# Patient Record
Sex: Female | Born: 1960 | Race: Black or African American | Hispanic: No | Marital: Single | Smoking: Never smoker
Health system: Southern US, Community
[De-identification: ages and names within clinical notes are randomized; demographics above are authoritative.]

## PROBLEM LIST (undated history)

## (undated) DIAGNOSIS — L309 Dermatitis, unspecified: Secondary | ICD-10-CM

## (undated) DIAGNOSIS — E079 Disorder of thyroid, unspecified: Secondary | ICD-10-CM

## (undated) DIAGNOSIS — N289 Disorder of kidney and ureter, unspecified: Secondary | ICD-10-CM

## (undated) DIAGNOSIS — J020 Streptococcal pharyngitis: Secondary | ICD-10-CM

## (undated) DIAGNOSIS — D649 Anemia, unspecified: Secondary | ICD-10-CM

## (undated) DIAGNOSIS — M1990 Unspecified osteoarthritis, unspecified site: Secondary | ICD-10-CM

## (undated) HISTORY — DX: Anemia, unspecified: D64.9

## (undated) HISTORY — DX: Unspecified osteoarthritis, unspecified site: M19.90

---

## 1998-03-05 HISTORY — PX: ABDOMINAL HYSTERECTOMY: SHX81

## 2000-06-02 ENCOUNTER — Emergency Department (HOSPITAL_COMMUNITY): Admission: EM | Admit: 2000-06-02 | Discharge: 2000-06-02 | Payer: Self-pay | Admitting: Emergency Medicine

## 2011-10-25 ENCOUNTER — Emergency Department (HOSPITAL_COMMUNITY): Payer: BC Managed Care – PPO

## 2011-10-25 ENCOUNTER — Encounter (HOSPITAL_COMMUNITY): Payer: Self-pay | Admitting: *Deleted

## 2011-10-25 ENCOUNTER — Emergency Department (HOSPITAL_COMMUNITY)
Admission: EM | Admit: 2011-10-25 | Discharge: 2011-10-25 | Disposition: A | Discharge status: Home Health | Payer: BC Managed Care – PPO | Attending: Emergency Medicine | Admitting: Emergency Medicine

## 2011-10-25 ENCOUNTER — Other Ambulatory Visit: Payer: Self-pay

## 2011-10-25 DIAGNOSIS — R0600 Dyspnea, unspecified: Secondary | ICD-10-CM

## 2011-10-25 DIAGNOSIS — R0609 Other forms of dyspnea: Secondary | ICD-10-CM | POA: Insufficient documentation

## 2011-10-25 DIAGNOSIS — R079 Chest pain, unspecified: Secondary | ICD-10-CM | POA: Insufficient documentation

## 2011-10-25 DIAGNOSIS — N289 Disorder of kidney and ureter, unspecified: Secondary | ICD-10-CM | POA: Insufficient documentation

## 2011-10-25 DIAGNOSIS — R0989 Other specified symptoms and signs involving the circulatory and respiratory systems: Secondary | ICD-10-CM | POA: Insufficient documentation

## 2011-10-25 DIAGNOSIS — R5381 Other malaise: Secondary | ICD-10-CM | POA: Insufficient documentation

## 2011-10-25 DIAGNOSIS — Z79899 Other long term (current) drug therapy: Secondary | ICD-10-CM | POA: Insufficient documentation

## 2011-10-25 DIAGNOSIS — L259 Unspecified contact dermatitis, unspecified cause: Secondary | ICD-10-CM | POA: Insufficient documentation

## 2011-10-25 DIAGNOSIS — J45909 Unspecified asthma, uncomplicated: Secondary | ICD-10-CM | POA: Insufficient documentation

## 2011-10-25 HISTORY — DX: Disorder of kidney and ureter, unspecified: N28.9

## 2011-10-25 HISTORY — DX: Dermatitis, unspecified: L30.9

## 2011-10-25 LAB — URINALYSIS, ROUTINE W REFLEX MICROSCOPIC
Glucose, UA: NEGATIVE mg/dL
Leukocytes, UA: NEGATIVE
Specific Gravity, Urine: 1.017 (ref 1.005–1.030)
pH: 5.5 (ref 5.0–8.0)

## 2011-10-25 LAB — CBC
HCT: 35.3 % — ABNORMAL LOW (ref 36.0–46.0)
Hemoglobin: 12 g/dL (ref 12.0–15.0)
MCHC: 34 g/dL (ref 30.0–36.0)
RDW: 13.7 % (ref 11.5–15.5)
WBC: 6.3 10*3/uL (ref 4.0–10.5)

## 2011-10-25 LAB — HEPATIC FUNCTION PANEL
AST: 18 U/L (ref 0–37)
Albumin: 4.3 g/dL (ref 3.5–5.2)
Total Bilirubin: 0.3 mg/dL (ref 0.3–1.2)
Total Protein: 8.2 g/dL (ref 6.0–8.3)

## 2011-10-25 LAB — BASIC METABOLIC PANEL
BUN: 17 mg/dL (ref 6–23)
Chloride: 102 mEq/L (ref 96–112)
GFR calc Af Amer: 55 mL/min — ABNORMAL LOW (ref >90)
GFR calc non Af Amer: 48 mL/min — ABNORMAL LOW (ref >90)
Potassium: 3.7 mEq/L (ref 3.5–5.1)
Sodium: 138 mEq/L (ref 135–145)

## 2011-10-25 LAB — URINE MICROSCOPIC-ADD ON

## 2011-10-25 LAB — LIPASE, BLOOD: Lipase: 54 U/L (ref 11–59)

## 2011-10-25 MED ORDER — ALBUTEROL SULFATE HFA 108 (90 BASE) MCG/ACT IN AERS: 1.0000 | INHALATION_SPRAY | Freq: Four times a day (QID) | RESPIRATORY_TRACT | Status: DC | PRN

## 2011-10-25 MED ORDER — SODIUM CHLORIDE 0.9 % IV BOLUS (SEPSIS)
500.0000 mL | Freq: Once | INTRAVENOUS | Status: AC
  Administered 2011-10-25: 1000 mL via INTRAVENOUS

## 2011-10-25 MED ORDER — SODIUM CHLORIDE 0.9 % IV SOLN: INTRAVENOUS | Status: DC

## 2011-10-25 MED ORDER — ALBUTEROL SULFATE (5 MG/ML) 0.5% IN NEBU
5.0000 mg | INHALATION_SOLUTION | Freq: Once | RESPIRATORY_TRACT | Status: AC
  Administered 2011-10-25: 5 mg via RESPIRATORY_TRACT
  Filled 2011-10-25: qty 1

## 2011-10-25 MED ORDER — IPRATROPIUM BROMIDE 0.02 % IN SOLN
0.5000 mg | Freq: Once | RESPIRATORY_TRACT | Status: AC
  Administered 2011-10-25: 0.5 mg via RESPIRATORY_TRACT
  Filled 2011-10-25: qty 2.5

## 2011-10-25 NOTE — ED Provider Notes (Signed)
History     CSN: [id number]  Arrival date & time 10/25/11  0944   First MD Initiated Contact with Patient 10/25/11 [phone]      Chief Complaint  Patient presents with  . Chest Pain  . Shortness of Breath    (Consider location/radiation/quality/duration/timing/severity/associated sxs/prior treatment) Patient is a 51 y.o. female presenting with chest pain and shortness of breath. The history is provided by the patient.  Chest Pain Primary symptoms include fatigue and shortness of breath. Pertinent negatives for primary symptoms include no abdominal pain, no nausea and no vomiting.  Pertinent negatives for associated symptoms include no numbness and no weakness.    Shortness of Breath  Associated symptoms include chest pain and shortness of breath.   patient's had shortness of breath cough lightheadedness nausea and vomiting since Saturday. Dull chest pain. She has a history of asthma. She states she's had some chills. She did not take her temperature. No diarrhea. She states she's been urinating more frequently. She states she's heard all over.  Past Medical History  Diagnosis Date  . Renal disorder   . Asthma   . Eczema     Past Surgical History  Procedure Date  . Abdominal hysterectomy     No family history on file.  History  Substance Use Topics  . Smoking status: Never Smoker   . Smokeless tobacco: Not on file  . Alcohol Use: No    OB History    Grav Para Term Preterm Abortions TAB SAB Ect Mult Living                  Review of Systems  Constitutional: Positive for chills and fatigue. Negative for activity change and appetite change.  HENT: Negative for neck stiffness.   Eyes: Negative for pain.  Respiratory: Positive for shortness of breath. Negative for chest tightness.   Cardiovascular: Positive for chest pain. Negative for leg swelling.  Gastrointestinal: Negative for nausea, vomiting, abdominal pain and diarrhea.  Genitourinary: Positive for frequency.  Negative for flank pain.  Musculoskeletal: Negative for back pain.  Skin: Negative for rash.  Neurological: Negative for weakness, numbness and headaches.  Psychiatric/Behavioral: Negative for behavioral problems.    Allergies  Review of patient's allergies indicates no known allergies.  Home Medications   Current Outpatient Rx  Name Route Sig Dispense Refill  . ALBUTEROL SULFATE HFA 108 (90 BASE) MCG/ACT IN AERS Inhalation Inhale 2 puffs into the lungs every 6 (six) hours as needed. For shortness of breath    . FERROUS SULFATE 325 (65 FE) MG PO TABS Oral Take 325 mg by mouth 3 (three) times daily with meals.    Marland Kitchen LISINOPRIL 10 MG PO TABS Oral Take 10 mg by mouth daily.    . ADULT MULTIVITAMIN W/MINERALS CH Oral Take 1 tablet by mouth daily.    . ALBUTEROL SULFATE HFA 108 (90 BASE) MCG/ACT IN AERS Inhalation Inhale 1-2 puffs into the lungs every 6 (six) hours as needed for wheezing. 1 Inhaler 0    BP 138/89  Pulse 84  Temp(Src) 98.1 F (36.7 C) (Oral)  Resp 16  SpO2 100%  Physical Exam  Nursing note and vitals reviewed. Constitutional: She is oriented to person, place, and time. She appears well-developed and well-nourished.  HENT:  Head: Normocephalic and atraumatic.  Eyes: EOM are normal. Pupils are equal, round, and reactive to light.  Neck: Normal range of motion. Neck supple.  Cardiovascular: Normal rate, regular rhythm and normal heart sounds.   No  murmur heard. Pulmonary/Chest: Effort normal and breath sounds normal. No respiratory distress. She has no wheezes. She has no rales.  Abdominal: Soft. Bowel sounds are normal. She exhibits no distension. There is no tenderness. There is no rebound and no guarding.  Musculoskeletal: Normal range of motion.  Neurological: She is alert and oriented to person, place, and time. No cranial nerve deficit.  Skin: Skin is warm and dry.  Psychiatric: She has a normal mood and affect. Her speech is normal.    ED Course  Procedures  (including critical care time)  Labs Reviewed  CBC - Abnormal; Notable for the following:    HCT 35.3 (*)    MCV 73.2 (*)    MCH 24.9 (*)    All other components within normal limits  BASIC METABOLIC PANEL - Abnormal; Notable for the following:    Creatinine, Ser 1.29 (*)    GFR calc non Af Amer 48 (*)    GFR calc Af Amer 55 (*)    All other components within normal limits  URINALYSIS, ROUTINE W REFLEX MICROSCOPIC - Abnormal; Notable for the following:    Hgb urine dipstick SMALL (*)    Protein, ur 100 (*)    All other components within normal limits  URINE MICROSCOPIC-ADD ON - Abnormal; Notable for the following:    Squamous Epithelial / LPF FEW (*)    Bacteria, UA FEW (*)    All other components within normal limits  TROPONIN I  HEPATIC FUNCTION PANEL  LIPASE, BLOOD  LAB REPORT - SCANNED   Dg Chest 2 View  10/25/2011  *RADIOLOGY REPORT*  Clinical Data: Shortness of breath, cough, left mid chest pain  CHEST - 2 VIEW  Comparison: None.  Findings: The lungs are hyperaerated consistent with COPD.  Vague nodular opacities in the left lung apex may be due to pleuroparenchymal scarring but comparison with prior or follow-up chest x-ray versus CT of the chest would be recommended. Mediastinal contours are within normal limits.  The heart is normal in size.  There is a mild thoracic scoliosis present.  IMPRESSION: Hyperaeration consistent with COPD.  Opacity in the left lung apex probably represents pleuroparenchymal scarring but compare with prior or follow-up chest x-ray to assess stability.  Original Report Authenticated By: Juline Patch, M.D.     1. Dyspnea   2. Chest pain      Date: 10/25/2011  Rate: 101  Rhythm: normal sinus rhythm  QRS Axis: normal  Intervals: normal  ST/T Wave abnormalities: normal  Conduction Disutrbances:none  Narrative Interpretation:   Old EKG Reviewed: none available and unchanged    MDM  Shortness of breath and chest pain. Some chills. Sounds  more viral. Lab work next is reassuring. She'll be discharged home with followup as needed        Juliet Rude. Rubin Payor, MD 10/26/11 [phone]

## 2011-10-25 NOTE — ED Notes (Signed)
Pt reports increased shortness of breath since Saturday. Chest pain is in central chest began yesterday. Reports dizziness, lightheadedness, diaphoresis, nausea/vomiting. Chest pain non-radiating. Hx of asthma, using inhaler without relief of shortness of breath.

## 2011-10-25 NOTE — ED Notes (Signed)
Pt states she is feeling much better after breathing tx.

## 2011-10-25 NOTE — ED Notes (Signed)
RT contacted to give breathing treatment. Kenan, RT will administer treatment.

## 2011-10-25 NOTE — Discharge Instructions (Signed)
Chest Pain (Nonspecific) It is often hard to give a specific diagnosis for the cause of chest pain. There is always a chance that your pain could be related to something serious, such as a heart attack or a blood clot in the lungs. You need to follow up with your caregiver for further evaluation. CAUSES   Heartburn.   Pneumonia or bronchitis.   Anxiety and stress.   Inflammation around your heart (pericarditis) or lung (pleuritis or pleurisy).   A blood clot in the lung.   A collapsed lung (pneumothorax). It can develop suddenly on its own (spontaneous pneumothorax) or from injury (trauma) to the chest.  The chest wall is composed of bones, muscles, and cartilage. Any of these can be the source of the pain.  The bones can be bruised by injury.   The muscles or cartilage can be strained by coughing or overwork.   The cartilage can be affected by inflammation and become sore (costochondritis).  DIAGNOSIS  Lab tests or other studies, such as X-rays, an EKG, stress testing, or cardiac imaging, may be needed to find the cause of your pain.  TREATMENT   Treatment depends on what may be causing your chest pain. Treatment may include:   Acid blockers for heartburn.   Anti-inflammatory medicine.   Pain medicine for inflammatory conditions.   Antibiotics if an infection is present.   You may be advised to change lifestyle habits. This includes stopping smoking and avoiding caffeine and chocolate.   You may be advised to keep your head raised (elevated) when sleeping. This reduces the chance of acid going backward from your stomach into your esophagus.   Most of the time, nonspecific chest pain will improve within 2 to 3 days with rest and mild pain medicine.  HOME CARE INSTRUCTIONS   If antibiotics were prescribed, take the full amount even if you start to feel better.   For the next few days, avoid physical activities that bring on chest pain. Continue physical activities as  directed.   Do not smoke cigarettes or drink alcohol until your symptoms are gone.   Only take over-the-counter or prescription medicine for pain, discomfort, or fever as directed by your caregiver.   Follow your caregiver's suggestions for further testing if your chest pain does not go away.   Keep any follow-up appointments you made. If you do not go to an appointment, you could develop lasting (chronic) problems with pain. If there is any problem keeping an appointment, you must call to reschedule.  SEEK MEDICAL CARE IF:   You think you are having problems from the medicine you are taking. Read your medicine instructions carefully.   Your chest pain does not go away, even after treatment.   You develop a rash with blisters on your chest.  SEEK IMMEDIATE MEDICAL CARE IF:   You have increased chest pain or pain that spreads to your arm, neck, jaw, back, or belly (abdomen).   You develop shortness of breath, an increasing cough, or you are coughing up blood.   You have severe back or abdominal pain, feel sick to your stomach (nauseous) or throw up (vomit).   You develop severe weakness, fainting, or chills.   You have an oral temperature above 102 F (38.9 C), not controlled by medicine.  THIS IS AN EMERGENCY. Do not wait to see if the pain will go away. Get medical help at once. Call your local emergency services (911 in [place] Do not drive yourself to   the hospital. MAKE SURE YOU:   Understand these instructions.   Will watch your condition.   Will get help right away if you are not doing well or get worse.  Document Released: 06/01/2005 Document Revised: 05/04/2011 Document Reviewed: 03/27/2008 [facility] Patient Information 2012 [place], [place].Dyspnea Shortness of breath (dyspnea) is the feeling of uneasy breathing. Dyspnea should be evaluated promptly. DIAGNOSIS  Many tests may be done to find why you are having shortness of breath. Tests may include:  A chest X-ray.    A lung function test.   Blood tests.   Recordings of the electrical activity of the heart (electrocardiogram).   Exercise testing.   Sound wave images of the heart (a cardiac echocardiogram).   A scan.  A cause for your shortness of breath may not be identified initially. In this case, it is important to have a follow-up exam with your caregiver. HOME CARE INSTRUCTIONS   Do not smoke. Smoking is a common cause of shortness of breath. Ask for help to stop smoking.   Avoid being around chemicals that may bother your breathing, such as paint fumes or dust.   Rest as needed. Slowly begin your usual activities.   If medications were prescribed, take them as directed for the full length of time directed. This includes oxygen and any inhaled medications, if prescribed.   It is very important that you follow up with your caregiver or other physician as directed. Waiting to do so or failure to follow up could result in worsening of your condition, possible disability, or death.   Be sure you understand what to do or who to call if your shortness of breath worsens.  SEEK MEDICAL CARE IF:   Your condition does not improve in the time expected.   You have a hard time doing your normal activities even with rest.   You have any side effects from or problems with medications prescribed.  SEEK IMMEDIATE MEDICAL CARE IF:   You feel your shortness of breath is getting worse.   You feel lightheaded, faint or develop a cough not controlled with medications.   You start coughing up blood.   You get pain with breathing.   You get chest pain or pain in your arms, shoulders or belly (abdomen).   You have a fever.   You are unable to walk up stairs or exercise the way you normally can.  MAKE SURE YOU:   Understand these instructions.   Will watch your condition.   Will get help right away if you are not doing well or get worse.  Document Released: 09/29/2004 Document Revised:  05/04/2011 Document Reviewed: 01/07/2010 [facility] Patient Information 2012 [place], [place].

## 2011-10-25 NOTE — ED Notes (Signed)
Pt unable to give urine sample at this time, will check back with pt once fluids start

## 2012-08-01 ENCOUNTER — Emergency Department (HOSPITAL_COMMUNITY)
Admission: EM | Admit: 2012-08-01 | Discharge: 2012-08-01 | Disposition: A | Discharge status: Home / Self Care | Payer: Self-pay | Attending: Emergency Medicine | Admitting: Emergency Medicine

## 2012-08-01 ENCOUNTER — Encounter (HOSPITAL_COMMUNITY): Payer: Self-pay | Admitting: Emergency Medicine

## 2012-08-01 DIAGNOSIS — Z87448 Personal history of other diseases of urinary system: Secondary | ICD-10-CM | POA: Insufficient documentation

## 2012-08-01 DIAGNOSIS — J45909 Unspecified asthma, uncomplicated: Secondary | ICD-10-CM | POA: Insufficient documentation

## 2012-08-01 DIAGNOSIS — J029 Acute pharyngitis, unspecified: Secondary | ICD-10-CM | POA: Insufficient documentation

## 2012-08-01 DIAGNOSIS — Z79899 Other long term (current) drug therapy: Secondary | ICD-10-CM | POA: Insufficient documentation

## 2012-08-01 DIAGNOSIS — Z872 Personal history of diseases of the skin and subcutaneous tissue: Secondary | ICD-10-CM | POA: Insufficient documentation

## 2012-08-01 DIAGNOSIS — J020 Streptococcal pharyngitis: Secondary | ICD-10-CM | POA: Insufficient documentation

## 2012-08-01 DIAGNOSIS — R1310 Dysphagia, unspecified: Secondary | ICD-10-CM | POA: Insufficient documentation

## 2012-08-01 HISTORY — DX: Streptococcal pharyngitis: J02.0

## 2012-08-01 NOTE — ED Notes (Signed)
Patient reports taking past prescription of Amoxicillin to see if it would help.

## 2012-08-01 NOTE — ED Provider Notes (Signed)
Medical screening examination/treatment/procedure(s) were performed by non-physician practitioner and as supervising physician I was immediately available for consultation/collaboration.   Alphonse Asbridge, MD 08/01/12 2305 

## 2012-08-01 NOTE — ED Provider Notes (Signed)
History     CSN: [id number]  Arrival date & time 08/01/12  2034   First MD Initiated Contact with Patient 08/01/12 2056      Chief Complaint  Patient presents with  . Sore Throat    (Consider location/radiation/quality/duration/timing/severity/associated sxs/prior treatment) HPI Comments: Patient states she has a history of frequent sore throats, having strep throat 4-5 times a year.  She has opted not to have a tonsillectomy.  She states she had one amoxicillin left from the last time.  She had strep throat.  She took that yesterday.  She has not contacted her primary care physician.  Patient is a 51 y.o. female presenting with pharyngitis. The history is provided by the patient.  Sore Throat This is a new problem. The current episode started more than 1 month ago. The problem has been unchanged. Associated symptoms include a sore throat. Pertinent negatives include no fever, headaches, nausea, rash or weakness.    Past Medical History  Diagnosis Date  . Renal disorder   . Asthma   . Eczema   . Strep throat     Past Surgical History  Procedure Date  . Abdominal hysterectomy     History reviewed. No pertinent family history.  History  Substance Use Topics  . Smoking status: Never Smoker   . Smokeless tobacco: Not on file  . Alcohol Use: No    OB History    Grav Para Term Preterm Abortions TAB SAB Ect Mult Living                  Review of Systems  Constitutional: Negative for fever.  HENT: Positive for sore throat and trouble swallowing.   Gastrointestinal: Negative for nausea.  Skin: Negative for rash.  Neurological: Negative for weakness and headaches.    Allergies  Review of patient's allergies indicates no known allergies.  Home Medications   Current Outpatient Rx  Name  Route  Sig  Dispense  Refill  . ALBUTEROL SULFATE HFA 108 (90 BASE) MCG/ACT IN AERS   Inhalation   Inhale 2 puffs into the lungs every 6 (six) hours as needed. For shortness of  breath         . FLUTICASONE PROPIONATE  HFA 110 MCG/ACT IN AERO   Inhalation   Inhale 1 puff into the lungs 2 (two) times daily.         Marland Kitchen LISINOPRIL 10 MG PO TABS   Oral   Take 10 mg by mouth daily.           BP 105/66  Pulse 83  Temp 99.6 F (37.6 C) (Oral)  Resp 16  SpO2 99%  Physical Exam  HENT:  Mouth/Throat: Uvula is midline. No uvula swelling. Posterior oropharyngeal erythema present. No oropharyngeal exudate or tonsillar abscesses.  Eyes: Pupils are equal, round, and reactive to light.  Neck: Normal range of motion.  Cardiovascular: Normal rate.   Pulmonary/Chest: Effort normal.  Abdominal: Soft.  Musculoskeletal: Normal range of motion.  Lymphadenopathy:    She has no cervical adenopathy.  Neurological: She is alert.  Skin: Skin is warm. No rash noted.    ED Course  Procedures (including critical care time)   Labs Reviewed  RAPID STREP SCREEN   No results found.   1. Pharyngitis       MDM   Strep test is negative.  Referred the patient.  Back to her primary care physician at this time.  I do not, think that antibiotics are indicated  Arman Filter, NP 08/01/12 2146

## 2012-08-01 NOTE — ED Notes (Signed)
Patient complaining of sore throat since yesterday morning; patient reports that she has a history of frequent strep throat.  No patches noted on tonsils; tonsils swollen and red.

## 2012-12-22 ENCOUNTER — Emergency Department (HOSPITAL_COMMUNITY)
Admission: EM | Admit: 2012-12-22 | Discharge: 2012-12-23 | Disposition: A | Discharge status: Home / Self Care | Payer: Self-pay | Attending: Emergency Medicine | Admitting: Emergency Medicine

## 2012-12-22 DIAGNOSIS — Z8619 Personal history of other infectious and parasitic diseases: Secondary | ICD-10-CM | POA: Insufficient documentation

## 2012-12-22 DIAGNOSIS — J45909 Unspecified asthma, uncomplicated: Secondary | ICD-10-CM | POA: Insufficient documentation

## 2012-12-22 DIAGNOSIS — Z87448 Personal history of other diseases of urinary system: Secondary | ICD-10-CM | POA: Insufficient documentation

## 2012-12-22 DIAGNOSIS — Z872 Personal history of diseases of the skin and subcutaneous tissue: Secondary | ICD-10-CM | POA: Insufficient documentation

## 2012-12-22 DIAGNOSIS — D649 Anemia, unspecified: Secondary | ICD-10-CM | POA: Insufficient documentation

## 2012-12-22 DIAGNOSIS — R1031 Right lower quadrant pain: Secondary | ICD-10-CM | POA: Insufficient documentation

## 2012-12-22 DIAGNOSIS — Z79899 Other long term (current) drug therapy: Secondary | ICD-10-CM | POA: Insufficient documentation

## 2012-12-22 DIAGNOSIS — Z3202 Encounter for pregnancy test, result negative: Secondary | ICD-10-CM | POA: Insufficient documentation

## 2012-12-22 DIAGNOSIS — K5900 Constipation, unspecified: Secondary | ICD-10-CM | POA: Insufficient documentation

## 2012-12-23 ENCOUNTER — Encounter (HOSPITAL_COMMUNITY): Payer: Self-pay | Admitting: *Deleted

## 2012-12-23 ENCOUNTER — Emergency Department (HOSPITAL_COMMUNITY): Payer: Self-pay

## 2012-12-23 LAB — URINALYSIS, ROUTINE W REFLEX MICROSCOPIC
Bilirubin Urine: NEGATIVE
Glucose, UA: NEGATIVE mg/dL
Ketones, ur: NEGATIVE mg/dL
Leukocytes, UA: NEGATIVE
Nitrite: NEGATIVE
Protein, ur: NEGATIVE mg/dL
Specific Gravity, Urine: 1.027 (ref 1.005–1.030)
Urobilinogen, UA: 0.2 mg/dL (ref 0.0–1.0)
pH: 5.5 (ref 5.0–8.0)

## 2012-12-23 LAB — CBC
HCT: 30.5 % — ABNORMAL LOW (ref 36.0–46.0)
Hemoglobin: 10.6 g/dL — ABNORMAL LOW (ref 12.0–15.0)
MCH: 25.1 pg — ABNORMAL LOW (ref 26.0–34.0)
MCHC: 34.8 g/dL (ref 30.0–36.0)
MCV: 72.1 fL — ABNORMAL LOW (ref 78.0–100.0)
Platelets: 186 10*3/uL (ref 150–400)
RBC: 4.23 MIL/uL (ref 3.87–5.11)
RDW: 13.9 % (ref 11.5–15.5)
WBC: 7.9 10*3/uL (ref 4.0–10.5)

## 2012-12-23 LAB — URINE MICROSCOPIC-ADD ON

## 2012-12-23 LAB — POCT I-STAT, CHEM 8
Chloride: 106 mEq/L (ref 96–112)
Creatinine, Ser: 1.3 mg/dL — ABNORMAL HIGH (ref 0.50–1.10)
HCT: 34 % — ABNORMAL LOW (ref 36.0–46.0)
Hemoglobin: 11.6 g/dL — ABNORMAL LOW (ref 12.0–15.0)
Potassium: 4.2 mEq/L (ref 3.5–5.1)
Sodium: 141 mEq/L (ref 135–145)

## 2012-12-23 LAB — PREGNANCY, URINE: Preg Test, Ur: NEGATIVE

## 2012-12-23 MED ORDER — IOHEXOL 300 MG/ML  SOLN
100.0000 mL | Freq: Once | INTRAMUSCULAR | Status: AC | PRN
  Administered 2012-12-23: 100 mL via INTRAVENOUS

## 2012-12-23 MED ORDER — POLYETHYLENE GLYCOL 3350 17 G PO PACK: 17.0000 g | PACK | Freq: Every day | ORAL | Status: DC

## 2012-12-23 MED ORDER — IOHEXOL 300 MG/ML  SOLN
50.0000 mL | Freq: Once | INTRAMUSCULAR | Status: AC | PRN
  Administered 2012-12-23: 50 mL via ORAL

## 2012-12-23 MED ORDER — OXYCODONE-ACETAMINOPHEN 5-325 MG PO TABS: 2.0000 | ORAL_TABLET | ORAL | Status: DC | PRN

## 2012-12-23 MED ORDER — DOCUSATE SODIUM 100 MG PO CAPS: 100.0000 mg | ORAL_CAPSULE | Freq: Two times a day (BID) | ORAL | Status: DC

## 2012-12-23 MED ORDER — MORPHINE SULFATE 4 MG/ML IJ SOLN: 4.0000 mg | Freq: Once | INTRAMUSCULAR | Status: DC

## 2012-12-23 MED ORDER — DICYCLOMINE HCL 20 MG PO TABS
20.0000 mg | ORAL_TABLET | Freq: Once | ORAL | Status: AC
  Administered 2012-12-23: 20 mg via ORAL
  Filled 2012-12-23: qty 1

## 2012-12-23 MED ORDER — DICYCLOMINE HCL 20 MG PO TABS: 20.0000 mg | ORAL_TABLET | Freq: Four times a day (QID) | ORAL | Status: DC | PRN

## 2012-12-23 NOTE — ED Notes (Signed)
Patient is alert and oriented x3.  She was given DC instructions and follow up visit instructions.  Patient gave verbal understanding. She was DC ambulatory under her own power to home.  V/S stable.  He was not showing any signs of distress on DC 

## 2012-12-23 NOTE — ED Notes (Signed)
Patient is alert and oriented x3.  She is complaining of right groin pain that started on Wednesday night after she had a pelvic exam earlier that day.  She denies any nausea but does have constipation.  She currently rates her pain 10 of 10 intermittently

## 2012-12-23 NOTE — ED Provider Notes (Signed)
History     CSN: [id number]  Arrival date & time 12/22/12  2343   First MD Initiated Contact with Patient 12/23/12 0012      Chief Complaint  Patient presents with  . Groin Pain    (Consider location/radiation/quality/duration/timing/severity/associated sxs/prior treatment) Patient is a 52 y.o. female presenting with groin pain. The history is provided by the patient. No language interpreter was used.  Groin Pain Associated symptoms include abdominal pain. Pertinent negatives include no chills, fever, nausea or vomiting.   Pt c/o 4 day hx of RLQ pain that is sharp in nature after pelvic exam earlier that day.  Waxes and wanes, 10/10 at worst, 1-2/10 otherwise.  Has tried Motrin and percocet that was left over from 32yr ago.  This did not alleviate pain so pt sought medical care  Did not think to f/u with Dr who performed pelvic exam on Wednesday.  Denies previous hx of similar pain.  Does report constipation since Wed.  Denies fever, n/v/d or dysuria. PMHx hysterectomy in 1999, CKD, and asthma.     Past Medical History  Diagnosis Date  . Renal disorder   . Asthma   . Eczema   . Strep throat     Past Surgical History  Procedure Laterality Date  . Abdominal hysterectomy      History reviewed. No pertinent family history.  History  Substance Use Topics  . Smoking status: Never Smoker   . Smokeless tobacco: Not on file  . Alcohol Use: No    OB History   Grav Para Term Preterm Abortions TAB SAB Ect Mult Living                  Review of Systems  Constitutional: Negative for fever and chills.  Gastrointestinal: Positive for abdominal pain and constipation. Negative for nausea, vomiting, diarrhea and abdominal distention.  Genitourinary: Negative for dysuria, flank pain and menstrual problem.    Allergies  Shellfish allergy  Home Medications   Current Outpatient Rx  Name  Route  Sig  Dispense  Refill  . albuterol (PROVENTIL HFA;VENTOLIN HFA) 108 (90 BASE)  MCG/ACT inhaler   Inhalation   Inhale 2 puffs into the lungs every 6 (six) hours as needed for shortness of breath. For shortness of breath         . fluticasone (FLOVENT HFA) 110 MCG/ACT inhaler   Inhalation   Inhale 1 puff into the lungs 2 (two) times daily.         Marland Kitchen ibuprofen (ADVIL,MOTRIN) 200 MG tablet   Oral   Take 800 mg by mouth every 6 (six) hours as needed for pain.         Marland Kitchen lisinopril (PRINIVIL,ZESTRIL) 10 MG tablet   Oral   Take 10 mg by mouth every evening.          Marland Kitchen oxyCODONE-acetaminophen (PERCOCET/ROXICET) 5-325 MG per tablet   Oral   Take 1 tablet by mouth every 8 (eight) hours as needed for pain.           BP 130/70  Pulse 69  Temp(Src) 98.4 F (36.9 C) (Oral)  Resp 18  SpO2 100%  Physical Exam  Nursing note and vitals reviewed. Constitutional: She appears well-developed and well-nourished.  HENT:  Head: Normocephalic and atraumatic.  Eyes: Conjunctivae are normal. No scleral icterus.  Neck: Normal range of motion. No JVD present.  Cardiovascular: Normal rate, regular rhythm and normal heart sounds.   Pulmonary/Chest: Effort normal and breath sounds normal. No respiratory  distress. She has no wheezes. She has no rales. She exhibits no tenderness.  Abdominal: Soft. Bowel sounds are normal. She exhibits no distension and no mass. There is tenderness ( diffuse, worst in RLQ   ). There is no rebound and no guarding.  Musculoskeletal: Normal range of motion.  Neurological: She is alert.  Skin: Skin is warm and dry.    ED Course  Procedures (including critical care time)  Labs Reviewed - No data to display No results found.   No diagnosis found.    MDM  Pt c/o RLQ pain since Wed night after pelvic exam earlier in day.  No previous hx of similar pain.  Tried motrin and percocet w/o relief.  Admits to being constipated since Wednesday.  Denies fever, n/v/d or dysuria.   PE: Abd: nondistended nl bowel sounds, soft, diffuse tenderness,  worst in RLQ. No masses, guarding or rebound.   Pt declined pain meds at this time.    Obtaining labs: CBC, chem 8, UA Imaging: CT abdomen    1:28 AM Signed out to Dr. Norlene Campbell.      Junius Finner, PA-C 12/23/12 0128  Junius Finner, PA-C 12/23/12 0128

## 2012-12-23 NOTE — ED Notes (Signed)
Pt c/o R groin pain that makes ambulation difficult at time. Pt denies hx of ovarian cysts.

## 2012-12-23 NOTE — ED Provider Notes (Signed)
Medical screening examination/treatment/procedure(s) were conducted as a shared visit with non-physician practitioner(s) and myself.  I personally evaluated the patient during the encounter.  Pt with 4-5 days of right lower abd pain, constipation.  Not usually constipated.  No medications for pain/constipation.  Pain is sharp, crampy, comes in waves.  No fever, no n/v/d.  No sick contacts.  Pt s/p hysterectomy.  No prior h/o diverticulosis.  Exam with RLQ pain, no rebound or guarding.  No hernias noted.   Results for orders placed during the hospital encounter of 12/22/12  CBC      Result Value Range   WBC 7.9  4.0 - 10.5 K/uL   RBC 4.23  3.87 - 5.11 MIL/uL   Hemoglobin 10.6 (*) 12.0 - 15.0 g/dL   HCT 46.9 (*) 62.9 - 52.8 %   MCV 72.1 (*) 78.0 - 100.0 fL   MCH 25.1 (*) 26.0 - 34.0 pg   MCHC 34.8  30.0 - 36.0 g/dL   RDW 41.3  24.4 - 01.0 %   Platelets 186  150 - 400 K/uL  URINALYSIS, ROUTINE W REFLEX MICROSCOPIC      Result Value Range   Color, Urine YELLOW  YELLOW   APPearance CLEAR  CLEAR   Specific Gravity, Urine 1.027  1.005 - 1.030   pH 5.5  5.0 - 8.0   Glucose, UA NEGATIVE  NEGATIVE mg/dL   Hgb urine dipstick TRACE (*) NEGATIVE   Bilirubin Urine NEGATIVE  NEGATIVE   Ketones, ur NEGATIVE  NEGATIVE mg/dL   Protein, ur NEGATIVE  NEGATIVE mg/dL   Urobilinogen, UA 0.2  0.0 - 1.0 mg/dL   Nitrite NEGATIVE  NEGATIVE   Leukocytes, UA NEGATIVE  NEGATIVE  PREGNANCY, URINE      Result Value Range   Preg Test, Ur NEGATIVE  NEGATIVE  URINE MICROSCOPIC-ADD ON      Result Value Range   Squamous Epithelial / LPF RARE  RARE   WBC, UA 0-2  <3 WBC/hpf   RBC / HPF 0-2  <3 RBC/hpf  POCT I-STAT, CHEM 8      Result Value Range   Sodium 141  135 - 145 mEq/L   Potassium 4.2  3.5 - 5.1 mEq/L   Chloride 106  96 - 112 mEq/L   BUN 23  6 - 23 mg/dL   Creatinine, Ser 2.72 (*) 0.50 - 1.10 mg/dL   Glucose, Bld 91  70 - 99 mg/dL   Calcium, Ion 5.36 (*) 1.12 - 1.23 mmol/L   TCO2 26  0 - 100 mmol/L   Hemoglobin 11.6 (*) 12.0 - 15.0 g/dL   HCT 64.4 (*) 03.4 - 74.2 %   Ct Abdomen Pelvis W Contrast  12/23/2012  **ADDENDUM** CREATED: 12/23/2012 02:27:44  Note that the patient's clinical data should not include a history of asthma, lung cancer or chemotherapy, nor shortness of breath or epistaxis.  The indication for the study is: right groin pain and constipation.  **END ADDENDUM** SIGNED BY: Tonia Ghent, M.D.   12/23/2012  *RADIOLOGY REPORT*  Clinical Data: Right groin pain, constipation, shortness of breath, epistaxis, history asthma, lung cancer, recent chemotherapy  CT ABDOMEN AND PELVIS WITH CONTRAST  Technique:  Multidetector CT imaging of the abdomen and pelvis was performed following the standard protocol during bolus administration of intravenous contrast. And  Sagittal and coronal MPR images reconstructed from axial data set.  Contrast: 50mL OMNIPAQUE IOHEXOL 300 MG/ML  SOLN, OMNIPAQUE IOHEXOL 300 MG/ML  SOLN  Comparison: None  Findings:  Lung bases clear. Bilateral renal cysts. Liver, spleen, pancreas, kidneys, and adrenal glands otherwise normal appearance. Normal appendix, bladder, and ureters. Normal-appearing stomach. Bowel loops incompletely opacified, grossly unremarkable. No mass, adenopathy, free fluid, or inflammatory process. No hernia or acute osseous findings. Specifically, no right groin abnormalities noted.  IMPRESSION: No acute intra-abdominal or intrapelvic abnormalities. Small bilateral renal cysts.   Original Report Authenticated By: Ulyses Southward, M.D.     No specific findings on workup.  Will treat with stool softeners, bentyl, pain medications and close f/u with pcm.  Aimee Mackie, MD 12/23/12 [phone]

## 2013-11-20 ENCOUNTER — Ambulatory Visit (INDEPENDENT_AMBULATORY_CARE_PROVIDER_SITE_OTHER): Payer: 59 | Admitting: Family Medicine

## 2013-11-20 VITALS — BP 130/78 | HR 91 | Temp 98.1°F | Resp 18 | Ht 66.0 in | Wt 147.2 lb

## 2013-11-20 DIAGNOSIS — J029 Acute pharyngitis, unspecified: Secondary | ICD-10-CM

## 2013-11-20 DIAGNOSIS — Z111 Encounter for screening for respiratory tuberculosis: Secondary | ICD-10-CM

## 2013-11-20 LAB — POCT RAPID STREP A (OFFICE): Rapid Strep A Screen: NEGATIVE

## 2013-11-20 NOTE — Progress Notes (Signed)
   Subjective:    Patient ID: Aimee Austin, female    DOB: 12/17/1960, 53 y.o.   MRN: [id number]  HPI Patient presents with sore throat. Has had strep going around in daycare setting. Having headache also. No fever, but increased fatigue. Has history of anemia. No cough, no congestion or runny nose. Has history of strep carrier. No neck soreness. No vomiting. No history of allergies or snoring. Similar symptoms to previous strep.   Review of Systems  All other systems reviewed and are negative.       Objective:   Physical Exam  Constitutional: She is oriented to person, place, and time. She appears well-developed and well-nourished. No distress.  HENT:  Head: Normocephalic and atraumatic.  Mouth/Throat: Posterior oropharyngeal erythema present. No oropharyngeal exudate.  Few petechiae on tonsit  Eyes: Conjunctivae are normal. Pupils are equal, round, and reactive to light. No scleral icterus.  Neck: Normal range of motion. Neck supple.  Cardiovascular: Normal rate, regular rhythm and normal heart sounds.   Pulmonary/Chest: Effort normal and breath sounds normal.  Musculoskeletal: Normal range of motion.  Lymphadenopathy:    She has no cervical adenopathy.  Neurological: She is alert and oriented to person, place, and time.  Skin: Skin is warm and dry. She is not diaphoretic.  Psychiatric: She has a normal mood and affect. Her behavior is normal.      Assessment & Plan:  #1. Pharyngitis. - Suspect viral illness  - However, given history of frequent strep and exposure risk will obtain rapid strep for comfirmation - Rapid strep negative. Counsel conservative symptomatic care  #2. TB screening - TB questionnaire - PPD placed

## 2013-11-20 NOTE — Patient Instructions (Signed)
Thank you for coming in today  1. Return in 48-72 hrs to have TB test read 2. Rapid strep negative. I think this is viral. Treat with ice water, popsicles, cough drops, ibuprofen/tylenol  Sore Throat A sore throat is pain, burning, irritation, or scratchiness of the throat. There is often pain or tenderness when swallowing or talking. A sore throat may be accompanied by other symptoms, such as coughing, sneezing, fever, and swollen neck glands. A sore throat is often the first sign of another sickness, such as a cold, flu, strep throat, or mononucleosis (commonly known as mono). Most sore throats go away without medical treatment. CAUSES  The most common causes of a sore throat include:  A viral infection, such as a cold, flu, or mono.  A bacterial infection, such as strep throat, tonsillitis, or whooping cough.  Seasonal allergies.  Dryness in the air.  Irritants, such as smoke or pollution.  Gastroesophageal reflux disease (GERD). HOME CARE INSTRUCTIONS   Only take over-the-counter medicines as directed by your caregiver.  Drink enough fluids to keep your urine clear or pale yellow.  Rest as needed.  Try using throat sprays, lozenges, or sucking on hard candy to ease any pain (if older than 4 years or as directed).  Sip warm liquids, such as broth, herbal tea, or warm water with honey to relieve pain temporarily. You may also eat or drink cold or frozen liquids such as frozen ice pops.  Gargle with salt water (mix 1 tsp salt with 8 oz of water).  Do not smoke and avoid secondhand smoke.  Put a cool-mist humidifier in your bedroom at night to moisten the air. You can also turn on a hot shower and sit in the bathroom with the door closed for 5 10 minutes. SEEK IMMEDIATE MEDICAL CARE IF:  You have difficulty breathing.  You are unable to swallow fluids, soft foods, or your saliva.  You have increased swelling in the throat.  Your sore throat does not get better in 7  days.  You have nausea and vomiting.  You have a fever or persistent symptoms for more than 2 3 days.  You have a fever and your symptoms suddenly get worse. MAKE SURE YOU:   Understand these instructions.  Will watch your condition.  Will get help right away if you are not doing well or get worse. Document Released: 09/29/2004 Document Revised: 08/08/2012 Document Reviewed: 04/29/2012 [facility] Patient Information 2014 [place], [place].

## 2013-11-20 NOTE — Progress Notes (Signed)
  Tuberculosis Risk Questionnaire  1. No Were you born outside the [place] in one of the following parts of the world: [place], [place], [place], [place] or [place]?    2. No Have you traveled outside the [place] and lived for more than one month in one of the following parts of the world: [place], [place], [place], [place] or [place]?    3. No Do you have a compromised immune system such as from any of the following conditions:HIV/AIDS, organ or bone marrow transplantation, diabetes, immunosuppressive medicines (e.g. Prednisone, Remicaide), leukemia, lymphoma, cancer of the head or neck, gastrectomy or jejunal bypass, end-stage renal disease (on dialysis), or silicosis?     4. No Have you ever or do you plan on working in: a residential care center, a health care facility, a jail or prison or homeless shelter?    5. No Have you ever: injected illegal drugs, used crack cocaine, lived in a homeless shelter  or been in jail or prison?     6. No Have you ever been exposed to anyone with infectious tuberculosis?    Tuberculosis Symptom Questionnaire  Do you currently have any of the following symptoms?  1. No Unexplained cough lasting more than 3 weeks?   2. No Unexplained fever lasting more than 3 weeks.   3. Yes  Night Sweats (sweating that leaves the bedclothes and sheets wet)     4. Yes  Shortness of Breath   5. No Chest Pain   6. No Unintentional weight loss    7. Yes  Unexplained fatigue (very tired for no reason)

## 2013-11-23 ENCOUNTER — Ambulatory Visit (INDEPENDENT_AMBULATORY_CARE_PROVIDER_SITE_OTHER): Payer: 59 | Admitting: Family Medicine

## 2013-11-23 DIAGNOSIS — Z111 Encounter for screening for respiratory tuberculosis: Secondary | ICD-10-CM

## 2013-11-23 DIAGNOSIS — Z09 Encounter for follow-up examination after completed treatment for conditions other than malignant neoplasm: Secondary | ICD-10-CM

## 2013-11-23 LAB — TB SKIN TEST
Induration: 0 mm
TB SKIN TEST: NEGATIVE

## 2013-12-05 ENCOUNTER — Encounter (HOSPITAL_COMMUNITY): Payer: Self-pay | Admitting: Emergency Medicine

## 2013-12-05 ENCOUNTER — Emergency Department (HOSPITAL_COMMUNITY)
Admission: EM | Admit: 2013-12-05 | Discharge: 2013-12-05 | Disposition: A | Discharge status: Home / Self Care | Payer: No Typology Code available for payment source | Attending: Emergency Medicine | Admitting: Emergency Medicine

## 2013-12-05 DIAGNOSIS — Z87448 Personal history of other diseases of urinary system: Secondary | ICD-10-CM | POA: Insufficient documentation

## 2013-12-05 DIAGNOSIS — F411 Generalized anxiety disorder: Secondary | ICD-10-CM | POA: Insufficient documentation

## 2013-12-05 DIAGNOSIS — Z8619 Personal history of other infectious and parasitic diseases: Secondary | ICD-10-CM | POA: Insufficient documentation

## 2013-12-05 DIAGNOSIS — Z79899 Other long term (current) drug therapy: Secondary | ICD-10-CM | POA: Insufficient documentation

## 2013-12-05 DIAGNOSIS — Y92410 Unspecified street and highway as the place of occurrence of the external cause: Secondary | ICD-10-CM | POA: Insufficient documentation

## 2013-12-05 DIAGNOSIS — R110 Nausea: Secondary | ICD-10-CM | POA: Insufficient documentation

## 2013-12-05 DIAGNOSIS — Y9389 Activity, other specified: Secondary | ICD-10-CM | POA: Insufficient documentation

## 2013-12-05 DIAGNOSIS — Z872 Personal history of diseases of the skin and subcutaneous tissue: Secondary | ICD-10-CM | POA: Insufficient documentation

## 2013-12-05 DIAGNOSIS — F419 Anxiety disorder, unspecified: Secondary | ICD-10-CM

## 2013-12-05 DIAGNOSIS — J45909 Unspecified asthma, uncomplicated: Secondary | ICD-10-CM | POA: Insufficient documentation

## 2013-12-05 DIAGNOSIS — Z043 Encounter for examination and observation following other accident: Secondary | ICD-10-CM | POA: Insufficient documentation

## 2013-12-05 MED ORDER — LORAZEPAM 0.5 MG PO TABS
0.5000 mg | ORAL_TABLET | Freq: Once | ORAL | Status: AC
  Administered 2013-12-05: 0.5 mg via ORAL
  Filled 2013-12-05: qty 1

## 2013-12-05 NOTE — ED Notes (Signed)
Pt has a ride home.  
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
## 2013-12-06 NOTE — ED Provider Notes (Signed)
Medical screening examination/treatment/procedure(s) were performed by non-physician practitioner and as supervising physician I was immediately available for consultation/collaboration.   Babette Relic, MD 12/06/13 [phone]

## 2014-07-09 NOTE — Progress Notes (Signed)
Patient presenting for Tb read only; no provider encounter.

## 2014-12-24 ENCOUNTER — Other Ambulatory Visit: Payer: Self-pay

## 2014-12-24 DIAGNOSIS — Z1231 Encounter for screening mammogram for malignant neoplasm of breast: Secondary | ICD-10-CM

## 2015-06-09 ENCOUNTER — Other Ambulatory Visit: Payer: Self-pay | Admitting: Family

## 2015-06-09 ENCOUNTER — Ambulatory Visit
Admission: RE | Admit: 2015-06-09 | Discharge: 2015-06-09 | Disposition: A | Discharge status: Home / Self Care | Payer: BC Managed Care – PPO | Source: Ambulatory Visit | Attending: Family | Admitting: Family

## 2015-06-09 DIAGNOSIS — R0789 Other chest pain: Secondary | ICD-10-CM

## 2015-08-03 ENCOUNTER — Ambulatory Visit
Admission: RE | Admit: 2015-08-03 | Discharge: 2015-08-03 | Disposition: A | Discharge status: Home / Self Care | Payer: BC Managed Care – PPO | Source: Ambulatory Visit

## 2015-08-03 DIAGNOSIS — Z1231 Encounter for screening mammogram for malignant neoplasm of breast: Secondary | ICD-10-CM

## 2015-10-21 ENCOUNTER — Other Ambulatory Visit: Payer: Self-pay | Admitting: Obstetrics and Gynecology

## 2015-10-21 ENCOUNTER — Other Ambulatory Visit (HOSPITAL_COMMUNITY)
Admission: RE | Admit: 2015-10-21 | Discharge: 2015-10-21 | Disposition: A | Discharge status: Home / Self Care | Payer: BC Managed Care – PPO | Source: Ambulatory Visit | Attending: Obstetrics and Gynecology | Admitting: Obstetrics and Gynecology

## 2015-10-21 DIAGNOSIS — Z1151 Encounter for screening for human papillomavirus (HPV): Secondary | ICD-10-CM | POA: Insufficient documentation

## 2015-10-21 DIAGNOSIS — Z01419 Encounter for gynecological examination (general) (routine) without abnormal findings: Secondary | ICD-10-CM | POA: Diagnosis present

## 2015-10-23 LAB — CYTOLOGY - PAP

## 2015-11-09 ENCOUNTER — Ambulatory Visit (INDEPENDENT_AMBULATORY_CARE_PROVIDER_SITE_OTHER): Payer: BC Managed Care – PPO | Admitting: Physician Assistant

## 2015-11-09 VITALS — BP 144/72 | HR 81 | Temp 97.9°F | Resp 16 | Ht 66.0 in | Wt 139.2 lb

## 2015-11-09 DIAGNOSIS — H109 Unspecified conjunctivitis: Secondary | ICD-10-CM | POA: Diagnosis not present

## 2015-11-09 MED ORDER — POLYMYXIN B-TRIMETHOPRIM 10000-0.1 UNIT/ML-% OP SOLN: 1.0000 [drp] | Freq: Four times a day (QID) | OPHTHALMIC | Status: AC
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
## 2015-11-09 NOTE — Patient Instructions (Signed)
If you are not improving in 48 hours, or if you are any worse tomorrow, see an eye specialist.  [facility] [address] [phone]

## 2015-11-09 NOTE — Progress Notes (Signed)
Patient ID: Aimee Austin, female    DOB: Sep 27, 1960, 55 y.o.   MRN: [id number]  PCP: Tillie Fantasia, MD  Subjective:   Chief Complaint  Patient presents with  . Eye Pain     Rt Eye pain  - feels very hat  x 5 days    HPI Presents for evaluation of eye irritation x5 days.  Patient reports that 5 days ago she was in a playground with bark chips and mulch. She saw something fly into her right eye. She assumed it was a bug but didn't see it. Flushed it out and the eye felt a little irritable but nothing severe. Her daughter is a Marine scientist and flushed it out as well. States it wasn't swollen, painful, or red until yesterday, Sunday 11/08/15 when it began to be uncomfortable, hot, and swollen.   Reports feeling "sticky" yesterday morning with a little crustiness. Reports the eye wasn't hard to open. States it's more a pressure behind the eye than a pain. States she also feels a pressure sensation along her nose and under her eye. States it feels worse when she leans forward with her head down. Complains of some itchiness that started today.  Denies any eye redness expect when flushing with water. Denies vision changes but states when she's looking at something for awhile feels like her eye gets tired/tight so starts to squint.  Tried clear eyes which helped relieve the scratchiness which is gone now. Ice compress also makes eye feel better.   Denies SOB, headache, or chest pain. .   Review of Systems Constitutional: Negative for fever and chills.  HENT: Negative for ear discharge, ear pain and hearing loss.  Eyes: Positive for pain, redness and itching. Negative for discharge and visual disturbance.  Respiratory: Negative for cough and shortness of breath.  Cardiovascular: Negative for chest pain.  Neurological: Negative for dizziness, light-headedness and headaches.     Patient Active Problem List   Diagnosis Date Noted  . Strep throat      Prior to Admission medications     Medication Sig Start Date End Date Taking? Authorizing Provider  albuterol (PROVENTIL HFA;VENTOLIN HFA) 108 (90 BASE) MCG/ACT inhaler Inhale 2 puffs into the lungs every 6 (six) hours as needed for shortness of breath. For shortness of breath   Yes Historical Provider, MD  lisinopril (PRINIVIL,ZESTRIL) 10 MG tablet Take 10 mg by mouth every evening.    Yes Historical Provider, MD     Allergies  Allergen Reactions  . Shellfish Allergy Anaphylaxis       Objective:  Physical Exam  Constitutional: She is oriented to person, place, and time. She appears well-developed and well-nourished. She is active and cooperative. No distress.  BP 144/72 mmHg  Pulse 81  Temp(Src) 97.9 F (36.6 C) (Oral)  Resp 16  Ht 5\' 6"  (1.676 m)  Wt 139 lb 3.2 oz (63.141 kg)  BMI 22.48 kg/m2  SpO2 98%   HENT:  Head: Normocephalic and atraumatic.  Eyes: Conjunctivae, EOM and lids are normal. Pupils are equal, round, and reactive to light. Right eye exhibits no chemosis, no discharge, no exudate and no hordeolum. No foreign body present in the right eye. Left eye exhibits no chemosis, no discharge, no exudate and no hordeolum. No foreign body present in the left eye. No scleral icterus.  Local anesthesia with 2 gtts proparacaine, and fluorescein instilled. No corneal abrasions. Modest generalized increased uptake of the conjunctiva in the lateral eye. No FB. Eye irrigated with saline.  Pulmonary/Chest: Effort normal.  Neurological: She is alert and oriented to person, place, and time.  Psychiatric: She has a normal mood and affect. Her speech is normal and behavior is normal.           Assessment & Plan:   1. Conjunctivitis of right eye Possible early conjunctivitis, must most likely residual inflammation from previous FB which patient flushed initially. Cover with abx gtts. Monitor closely for persistent/worsening symptoms, which should prompt eye specialist evaluation. - trimethoprim-polymyxin b  (POLYTRIM) ophthalmic solution; Place 1 drop into the right eye every 6 (six) hours.  Dispense: 10 mL; Refill: 0   Fara Chute, PA-C Physician Assistant-Certified [facility]

## 2016-05-12 ENCOUNTER — Other Ambulatory Visit: Payer: Self-pay | Admitting: Internal Medicine

## 2016-05-12 DIAGNOSIS — E049 Nontoxic goiter, unspecified: Secondary | ICD-10-CM

## 2016-07-04 ENCOUNTER — Ambulatory Visit
Admission: RE | Admit: 2016-07-04 | Discharge: 2016-07-04 | Disposition: A | Discharge status: Home / Self Care | Payer: BC Managed Care – PPO | Source: Ambulatory Visit | Attending: Internal Medicine | Admitting: Internal Medicine

## 2016-07-04 DIAGNOSIS — E049 Nontoxic goiter, unspecified: Secondary | ICD-10-CM

## 2016-07-08 ENCOUNTER — Other Ambulatory Visit: Payer: Self-pay | Admitting: Internal Medicine

## 2016-07-08 DIAGNOSIS — E041 Nontoxic single thyroid nodule: Secondary | ICD-10-CM

## 2016-07-15 ENCOUNTER — Ambulatory Visit
Admission: RE | Admit: 2016-07-15 | Discharge: 2016-07-15 | Disposition: A | Discharge status: Home / Self Care | Payer: BC Managed Care – PPO | Source: Ambulatory Visit | Attending: Internal Medicine | Admitting: Internal Medicine

## 2016-07-15 ENCOUNTER — Other Ambulatory Visit (HOSPITAL_COMMUNITY)
Admission: RE | Admit: 2016-07-15 | Discharge: 2016-07-15 | Disposition: A | Discharge status: Home / Self Care | Payer: BC Managed Care – PPO | Source: Ambulatory Visit | Attending: General Surgery | Admitting: General Surgery

## 2016-07-15 DIAGNOSIS — E041 Nontoxic single thyroid nodule: Secondary | ICD-10-CM

## 2016-08-10 ENCOUNTER — Telehealth: Payer: Self-pay | Admitting: Hematology

## 2016-08-10 ENCOUNTER — Encounter: Payer: Self-pay | Admitting: Hematology

## 2016-08-10 NOTE — Telephone Encounter (Signed)
Pt confirmed appt, verified demo and insurance, mailed pt letter, fax referring provider appt date/time.

## 2016-08-24 ENCOUNTER — Ambulatory Visit (HOSPITAL_BASED_OUTPATIENT_CLINIC_OR_DEPARTMENT_OTHER): Payer: BC Managed Care – PPO | Admitting: Hematology

## 2016-08-24 ENCOUNTER — Encounter: Payer: Self-pay | Admitting: Hematology

## 2016-08-24 VITALS — BP 133/66 | HR 101 | Temp 98.5°F | Resp 18 | Wt 138.5 lb

## 2016-08-24 DIAGNOSIS — N189 Chronic kidney disease, unspecified: Secondary | ICD-10-CM | POA: Diagnosis not present

## 2016-08-24 DIAGNOSIS — D582 Other hemoglobinopathies: Secondary | ICD-10-CM | POA: Diagnosis not present

## 2016-08-24 DIAGNOSIS — D638 Anemia in other chronic diseases classified elsewhere: Secondary | ICD-10-CM

## 2016-08-24 DIAGNOSIS — D509 Iron deficiency anemia, unspecified: Secondary | ICD-10-CM

## 2016-08-24 NOTE — Progress Notes (Signed)
[facility]    HEMATOLOGY/ONCOLOGY CONSULTATION NOTE  Date of Service: 08/24/2016  Patient Care Team: Glendale Chard, MD as PCP - General (Internal Medicine)  CHIEF COMPLAINTS/PURPOSE OF CONSULTATION:  Hemoglobinopathy?  HISTORY OF PRESENTING ILLNESS:   Aimee Austin is a wonderful 55 y.o. 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  Patient has a history of asthma, eczema, rheumatoid arthritis, chronic kidney disease, hypertension, anemia thought to be related to iron deficiency status post IV iron in 1999.  During an annual physical examination in September 2017 patient had a CBC that showed a hemoglobin of 10.5 with an MCV of 77 with normal platelet count of 202k and normal WBC count of 6.2k. Patient was on oral iron on and off for years without correction of anemia . Patient had an iron profile done which showed a ferritin of 110 and an iron saturation of 18% suggesting against overt iron deficiency anemia as a cause of her microcytic anemia. Patient had a hemoglobin electrophoresis which showed 64.3% hemoglobin A and 34.1% hemoglobin C. 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  Patient has no acute symptoms of her anemia. No dyspnea on exertion no shortness of breath. No evidence of overt bleeding.   MEDICAL HISTORY:  Past Medical History:  Diagnosis Date  . Anemia   . Arthritis    Rheumatoid Arthritis 2010  . Asthma   . Eczema   . Renal disorder   . Strep throat   Hypertension Chronic kidney disease History of anemia previously treated with IV iron in 1999 Vitamin D deficiency Plantar fasciitis History of thyroid nodule   SURGICAL HISTORY: Past Surgical History:  Procedure Laterality Date  . ABDOMINAL HYSTERECTOMY  03/1998   Uterus only  Expiratory laparotomy  SOCIAL HISTORY: Social History   Social History  . Marital status: Single   Spouse name: N/A  . Number of children: N/A  . Years of education: N/A   Occupational History  . Teacher Printmaker   Social History Main Topics  . Smoking status: Never Smoker  . Smokeless tobacco: Never Used  . Alcohol use No  . Drug use: No  . Sexual activity: No   Other Topics Concern  . Not on file   Social History Narrative   Lives with daughter and granddaughter. Her daughter is a Marine scientist at [employer].     FAMILY HISTORY: Family History  Problem Relation Age of Onset  . Adopted: Yes    ALLERGIES:  is allergic to shellfish allergy.  MEDICATIONS:  Current Outpatient Prescriptions  Medication Sig Dispense Refill  . albuterol (PROVENTIL HFA;VENTOLIN HFA) 108 (90 BASE) MCG/ACT inhaler Inhale 2 puffs into the lungs every 6 (six) hours as needed for shortness of breath. For shortness of breath    . lisinopril (PRINIVIL,ZESTRIL) 10 MG tablet Take 10 mg by mouth every evening.      No current facility-administered medications for this visit.    Iron fusion plus  REVIEW OF SYSTEMS:    10 Point review of Systems was done is negative except as noted above.  PHYSICAL EXAMINATION: ECOG PERFORMANCE STATUS: 1 - Symptomatic but completely ambulatory  . Vitals:   08/24/16 1411  BP: 133/66  Pulse: (!) 101  Resp: 18  Temp: 98.5 F (36.9 C)   Filed Weights   08/24/16 1411  Weight: 138 lb 8 oz (62.8 kg)   .Body mass index  is 22.35 kg/m.  GENERAL:alert, in no acute distress and comfortable SKIN: skin color, texture, turgor are normal, no rashes or significant lesions EYES: normal, conjunctiva are pink and non-injected, sclera clear OROPHARYNX:no exudate, no erythema and lips, buccal mucosa, and tongue normal  NECK: supple, no JVD, thyroid normal size, non-tender, without nodularity LYMPH:  no palpable lymphadenopathy in the cervical, axillary or inguinal LUNGS: clear to auscultation with normal respiratory effort HEART: regular rate &  rhythm,  no murmurs and no lower extremity edema ABDOMEN: abdomen soft, non-tender, normoactive bowel sounds No palpable hepatosplenomegaly Musculoskeletal: no cyanosis of digits and no clubbing  PSYCH: alert & oriented x 3 with fluent speech NEURO: no focal motor/sensory deficits  LABORATORY DATA:  I have reviewed the data as listed  CBC Latest Ref Rng & Units 12/23/2012 12/23/2012 10/25/2011  WBC 4.0 - 10.5 K/uL - 7.9 6.3  Hemoglobin 12.0 - 15.0 g/dL 11.6(L) 10.6(L) 12.0  Hematocrit 36.0 - 46.0 % 34.0(L) 30.5(L) 35.3(L)  Platelets 150 - 400 K/uL - 186 216   . CBC    Component Value Date/Time   WBC 7.9 12/23/2012 0145   RBC 4.23 12/23/2012 0145   HGB 11.6 (L) 12/23/2012 0215   HCT 34.0 (L) 12/23/2012 0215   PLT 186 12/23/2012 0145   MCV 72.1 (L) 12/23/2012 0145   MCH 25.1 (L) 12/23/2012 0145   MCHC 34.8 12/23/2012 0145   RDW 13.9 12/23/2012 0145    . CMP Latest Ref Rng & Units 12/23/2012 10/25/2011  Glucose 70 - 99 mg/dL 91 96  BUN 6 - 23 mg/dL 23 17  Creatinine 0.50 - 1.10 mg/dL 1.30(H) 1.29(H)  Sodium 135 - 145 mEq/L 141 138  Potassium 3.5 - 5.1 mEq/L 4.2 3.7  Chloride 96 - 112 mEq/L 106 102  CO2 19 - 32 mEq/L - 28  Calcium 8.4 - 10.5 mg/dL - 10.1  Total Protein 6.0 - 8.3 g/dL - 8.2  Total Bilirubin 0.3 - 1.2 mg/dL - 0.3  Alkaline Phos 39 - 117 U/L - 69  AST 0 - 37 U/L - 18  ALT 0 - 35 U/L - 9          RADIOGRAPHIC STUDIES: I have personally reviewed the radiological images as listed and agreed with the findings in the report. No results found.  ASSESSMENT & PLAN:   55 year old female with  #1 Chronic microcytic anemia. Patient's baseline hemoglobin has been as high as 12 and labs with primary care physician in September was 10.6 with an MCV of 77. Ferritin of 110 and saturation of 18%. Her mild anemia and microcytosis could certainly be from her hemoglobin C trait. Since hemoglobin is slightly lower than baseline this could be due to mild iron deficiency  or mild folic acid deficiency in the setting of chronic low-level hemolysis. Part of the anemia also could be anemia of chronic disease related to her rheumatoid arthritis/eczema and other chronic inflammatory disorders. Plan -Patient is asymptomatic at this time. -She was recommended take iron polysaccharide 150 mg by mouth daily with a plan to maintain her ferritin levels more than 100 and iron saturations of close to 30%.  -Also recommended to take either a B complex daily or folic acid 1 mg by mouth daily by itself to support accelerated hematopoiesis. -We discussed that she might have mild anemia times of metabolic stress. -Also might be prone to more anemia with myelosuppressive viral infections especially parvovirus. No concerning symptoms of viral infection at this time. -A part of her  anemia is due to inflammation it might improve with optimization of treatment for her eczema and rheumatoid arthritis. -No indication a bone marrow biopsy or other hematologic workup at this time .  Continue follow-up with primary care physician with repeat labs in 3-4 months to adjust iron and folic acid replacement . Continue follow-up with rheumatology to optimize treatment of rheumatoid arthritis   All of the patients questions were answered with apparent satisfaction. The patient knows to call the clinic with any problems, questions or concerns.  I spent 45 minutes counseling the patient face to face. The total time spent in the appointment was 60 minutes and more than 50% was on counseling and direct patient cares.    Sullivan Lone MD [place] [facility] Hematology/Oncology Physician [facility]  (Office):       [phone] (Work cell):  [phone] (Fax):           [phone]  08/24/2016 2:23 PM

## 2016-11-04 ENCOUNTER — Other Ambulatory Visit: Payer: Self-pay | Admitting: Nurse Practitioner

## 2016-11-04 ENCOUNTER — Ambulatory Visit
Admission: RE | Admit: 2016-11-04 | Discharge: 2016-11-04 | Disposition: A | Discharge status: Home / Self Care | Payer: BC Managed Care – PPO | Source: Ambulatory Visit | Attending: Nurse Practitioner | Admitting: Nurse Practitioner

## 2016-11-04 DIAGNOSIS — R109 Unspecified abdominal pain: Secondary | ICD-10-CM

## 2016-11-29 ENCOUNTER — Ambulatory Visit (INDEPENDENT_AMBULATORY_CARE_PROVIDER_SITE_OTHER): Payer: BC Managed Care – PPO | Admitting: Family Medicine

## 2016-11-29 ENCOUNTER — Ambulatory Visit (INDEPENDENT_AMBULATORY_CARE_PROVIDER_SITE_OTHER): Payer: BC Managed Care – PPO

## 2016-11-29 VITALS — BP 116/72 | HR 84 | Temp 98.4°F | Resp 16 | Ht 66.0 in | Wt 138.2 lb

## 2016-11-29 DIAGNOSIS — R0609 Other forms of dyspnea: Secondary | ICD-10-CM | POA: Diagnosis not present

## 2016-11-29 DIAGNOSIS — K5900 Constipation, unspecified: Secondary | ICD-10-CM | POA: Diagnosis not present

## 2016-11-29 DIAGNOSIS — R5383 Other fatigue: Secondary | ICD-10-CM | POA: Diagnosis not present

## 2016-11-29 DIAGNOSIS — R3129 Other microscopic hematuria: Secondary | ICD-10-CM

## 2016-11-29 DIAGNOSIS — M549 Dorsalgia, unspecified: Secondary | ICD-10-CM | POA: Diagnosis not present

## 2016-11-29 DIAGNOSIS — R1084 Generalized abdominal pain: Secondary | ICD-10-CM

## 2016-11-29 LAB — COMPREHENSIVE METABOLIC PANEL
A/G RATIO: 1.4 (ref 1.2–2.2)
ALT: 11 IU/L (ref 0–32)
AST: 21 IU/L (ref 0–40)
Albumin: 4.7 g/dL (ref 3.5–5.5)
Alkaline Phosphatase: 72 IU/L (ref 39–117)
BUN / CREAT RATIO: 16 (ref 9–23)
BUN: 20 mg/dL (ref 6–24)
Bilirubin Total: <0.2 mg/dL (ref 0.0–1.2)
CALCIUM: 10.1 mg/dL (ref 8.7–10.2)
CO2: 30 mmol/L — ABNORMAL HIGH (ref 18–29)
Chloride: 101 mmol/L (ref 96–106)
Creatinine, Ser: 1.22 mg/dL — ABNORMAL HIGH (ref 0.57–1.00)
GFR, EST AFRICAN AMERICAN: 58 mL/min/{1.73_m2} — AB (ref >59)
GFR, EST NON AFRICAN AMERICAN: 50 mL/min/{1.73_m2} — AB (ref >59)
GLOBULIN, TOTAL: 3.3 g/dL (ref 1.5–4.5)
Glucose: 84 mg/dL (ref 65–99)
POTASSIUM: 5 mmol/L (ref 3.5–5.2)
SODIUM: 141 mmol/L (ref 134–144)
TOTAL PROTEIN: 8 g/dL (ref 6.0–8.5)

## 2016-11-29 LAB — CBC WITH DIFFERENTIAL/PLATELET
Basophils Absolute: 0 10*3/uL (ref 0.0–0.2)
Basos: 1 %
EOS (ABSOLUTE): 0.2 10*3/uL (ref 0.0–0.4)
Eos: 3 %
HEMOGLOBIN: 11.8 g/dL (ref 11.1–15.9)
Hematocrit: 34.6 % (ref 34.0–46.6)
Lymphocytes Absolute: 2.2 10*3/uL (ref 0.7–3.1)
Lymphs: 35 %
MCH: 25.3 pg — ABNORMAL LOW (ref 26.6–33.0)
MCHC: 34.1 g/dL (ref 31.5–35.7)
MCV: 74 fL — ABNORMAL LOW (ref 79–97)
MONOS ABS: 0.4 10*3/uL (ref 0.1–0.9)
Monocytes: 6 %
NEUTROS PCT: 55 %
Neutrophils Absolute: 3.5 10*3/uL (ref 1.4–7.0)
PLATELETS: 226 10*3/uL (ref 150–379)
RBC: 4.67 x10E6/uL (ref 3.77–5.28)
RDW: 15 % (ref 12.3–15.4)
WBC: 6.3 10*3/uL (ref 3.4–10.8)

## 2016-11-29 LAB — POCT URINALYSIS DIP (MANUAL ENTRY)
BILIRUBIN UA: NEGATIVE
BILIRUBIN UA: NEGATIVE
GLUCOSE UA: NEGATIVE
Leukocytes, UA: NEGATIVE
Nitrite, UA: NEGATIVE
SPEC GRAV UA: 1.015 (ref 1.030–1.035)
Urobilinogen, UA: 0.2 (ref ?–2.0)
pH, UA: 6 (ref 5.0–8.0)

## 2016-11-29 LAB — POC MICROSCOPIC URINALYSIS (UMFC): MUCUS RE: ABSENT

## 2016-11-29 LAB — POCT SEDIMENTATION RATE: POCT SED RATE: 43 mm/hr — AB (ref 0–22)

## 2016-11-29 MED ORDER — IPRATROPIUM BROMIDE 0.02 % IN SOLN
0.5000 mg | Freq: Once | RESPIRATORY_TRACT | Status: AC
  Administered 2016-11-29: 0.5 mg via RESPIRATORY_TRACT

## 2016-11-29 MED ORDER — ALBUTEROL SULFATE (2.5 MG/3ML) 0.083% IN NEBU
2.5000 mg | INHALATION_SOLUTION | Freq: Once | RESPIRATORY_TRACT | Status: AC
  Administered 2016-11-29: 2.5 mg via RESPIRATORY_TRACT

## 2016-11-29 NOTE — Progress Notes (Addendum)
Subjective:  By signing my name below, I, Aimee Austin, attest that this documentation has been prepared under the direction and in the presence of Delman Cheadle, MD. Electronically Signed: Moises Austin, [place]. 11/29/2016 , 12:50 PM .  Patient was seen in Room 6 .   Patient ID: Aimee Austin, female    DOB: 1961/01/24, 56 y.o.   MRN: [id number] Chief Complaint  Patient presents with  . Back Pain    upper, radiates across and down hard to breathe when walking since yesterday   HPI Aimee Austin is a 56 y.o. female who has history of kidney disease, asthma and rheumatoid arthritis presents to Primary Care at [facility] complaining of constant upper back pain radiating across. She states it started yesterday, feeling really winded and tired after walking. She also noticed pain in her feet after work last night.   Today, she's continued feeling winded and short of breath even at rest. She has an inhaler for her asthma, and has been using it every 6 hours without relief. When she takes a deep breath, she notes pain radiating down to her right flank. She denies history of heart failure, liver failure or kidney stone. She denies being on a fluid pill. She denies checking her BP outside the office. She denies headaches, lightheadedness, or vision changes. Her nephrologist is Kaleen Odea, MD in [place]. She informs that she was instructed to not take tylenol, but was okay to take ibuprofen.   She mentions having anemia, but unsure where the source of her Austin loss is. She has history of hysterectomy.   Appetite has been reduced for the past several weeks. She was given something to stimulate her appetite and nausea. Had stomach xray in work-up which was normal 2-3 weeks ago.  This resolved and since then her appetite has been normal.  Noticed ridging and black streaks on her fingernails.  Chronic fatigue so many questions of a past history of autoimmune disease.  Depression screen [facility] 2/9  11/29/2016 11/09/2015  Decreased Interest 0 0  Down, Depressed, Hopeless 0 0  PHQ - 2 Score 0 0     Past Medical History:  Diagnosis Date  . Anemia   . Arthritis    Rheumatoid Arthritis 2010  . Asthma   . Eczema   . Renal disorder   . Strep throat    Prior to Admission medications   Medication Sig Start Date End Date Taking? Authorizing Provider  albuterol (PROVENTIL HFA;VENTOLIN HFA) 108 (90 BASE) MCG/ACT inhaler Inhale 2 puffs into the lungs every 6 (six) hours as needed for shortness of breath. For shortness of breath   Yes Historical Provider, MD  lisinopril (PRINIVIL,ZESTRIL) 10 MG tablet Take 10 mg by mouth every evening.    Yes Historical Provider, MD   Allergies  Allergen Reactions  . Shellfish Allergy Anaphylaxis   Past Surgical History:  Procedure Laterality Date  . ABDOMINAL HYSTERECTOMY  03/1998   Uterus only   Family History  Problem Relation Age of Onset  . Adopted: Yes   Social History   Social History  . Marital status: Single    Spouse name: N/A  . Number of children: N/A  . Years of education: N/A   Occupational History  . Teacher Printmaker   Social History Main Topics  . Smoking status: Never Smoker  . Smokeless tobacco: Never Used  . Alcohol use No  . Drug use: No  . Sexual activity:  No   Other Topics Concern  . None   Social History Narrative   Lives with daughter and granddaughter. Her daughter is a Marine scientist at [employer].     Review of Systems  Constitutional: Negative for chills, fatigue, fever and unexpected weight change.  Eyes: Negative for visual disturbance.  Respiratory: Positive for shortness of breath. Negative for cough.   Cardiovascular: Positive for chest pain.  Gastrointestinal: Negative for constipation, diarrhea, nausea and vomiting.  Musculoskeletal: Positive for back pain and myalgias.  Skin: Negative for rash and wound.  Neurological: Negative for dizziness, weakness and headaches.        Objective:   Physical Exam  Constitutional: She is oriented to person, place, and time. She appears well-developed and well-nourished. No distress.  HENT:  Head: Normocephalic and atraumatic.  Eyes: EOM are normal. Pupils are equal, round, and reactive to light.  Neck: Neck supple.  Cardiovascular: Regular rhythm, S1 normal, S2 normal and normal heart sounds.  Tachycardia present.  Exam reveals no gallop and no friction rub.   No murmur heard. Pulmonary/Chest: Effort normal. No respiratory distress.  Right rib tender to palpation over right flank  Abdominal: Bowel sounds are normal. There is tenderness (diffuse). There is guarding and CVA tenderness (bilaterally).  Prominent abdominal aorta  Musculoskeletal: Normal range of motion.  Trace pitting edema in right calf, non in left calf; complains of pain  Neurological: She is alert and oriented to person, place, and time. Gait abnormal.  Slowed shuffling gait  Skin: Skin is warm and dry.  Psychiatric: She has a normal mood and affect. Her behavior is normal.  Nursing note and vitals reviewed.   BP 116/72 (BP Location: Right Arm, Patient Position: Sitting, Cuff Size: Small)   Pulse 84   Temp 98.4 F (36.9 C) (Oral)   Resp 16   Ht 5\' 6"  (1.676 m)   Wt 138 lb 3.2 oz (62.7 kg)   SpO2 99%   BMI 22.31 kg/m     [facility] reading (PRIMARY) by  Dr. Brigitte Pulse. EKG: NSR, early repolarization  Peak flow: predicted: 460; before neb: 330  After neb: 350  Results for orders placed or performed in visit on 11/29/16  POCT urinalysis dipstick  Result Value Ref Range   Color, UA yellow yellow   Clarity, UA clear clear   Glucose, UA negative negative   Bilirubin, UA negative negative   Ketones, POC UA negative negative   Spec Grav, UA 1.015 1.030 - 1.035   Austin, UA moderate (A) negative   pH, UA 6.0 5.0 - 8.0   Protein Ur, POC trace (A) negative   Urobilinogen, UA 0.2 Negative - 2.0   Nitrite, UA Negative Negative   Leukocytes, UA  Negative Negative  POCT Microscopic Urinalysis ([facility])  Result Value Ref Range   WBC,UR,HPF,POC None None WBC/hpf   RBC,UR,HPF,POC Few (A) None RBC/hpf   Bacteria None None, Too numerous to count   Mucus Absent Absent   Epithelial Cells, UR Per Microscopy Moderate (A) None, Too numerous to count cells/hpf   Dg Chest 2 View  Result Date: 11/29/2016 CLINICAL DATA:  Fatigue.  Back pain and shortness of breath EXAM: CHEST  2 VIEW COMPARISON:  06/09/2015 FINDINGS: Normal heart size and mediastinal contours. No acute infiltrate or edema. No effusion or pneumothorax. Mild levoscoliosis. No acute osseous findings. IMPRESSION: No evidence of active disease. Electronically Signed   By: Monte Fantasia M.D.   On: 11/29/2016 12:58   Dg Abd 1 View  Result Date: 11/29/2016 CLINICAL DATA:  Abdominal pain. EXAM: ABDOMEN - 1 VIEW COMPARISON:  11/04/2016 . FINDINGS: Soft tissue structures are unremarkable. Large amount of stool noted throughout the colon. No bowel distention. Lumbar spine scoliosis concave left. Degenerative changes both hips. Pelvic calcifications consistent phleboliths. Pelvic staples noted . IMPRESSION: Large amount of stool noted throughout the colon consistent with constipation. Similar finding on prior exam. Electronically Signed   By: [place]   On: 11/29/2016 13:07   Dg Abd 1 View  Result Date: 11/04/2016 CLINICAL DATA:  Acute onset of generalized abdominal pain and nausea. Left flank pain. Initial encounter. EXAM: ABDOMEN - 1 VIEW COMPARISON:  CT of the abdomen and pelvis from 12/23/2012 FINDINGS: The visualized bowel gas pattern is unremarkable. Scattered air and stool filled loops of colon are seen; no abnormal dilatation of small bowel loops is seen to suggest small bowel obstruction. No free intra-abdominal air is identified, though evaluation for free air is limited on a single supine view. Postoperative change is noted at the left hemipelvis. The visualized osseous structures  are within normal limits; the sacroiliac joints are unremarkable in appearance. The visualized lung bases are essentially clear. IMPRESSION: Unremarkable bowel gas pattern; no free intra-abdominal air seen. Moderate amount of stool noted in the colon, raising question for mild constipation. Electronically Signed   By: Garald Balding M.D.   On: 11/04/2016 22:28    Assessment & Plan:   1. Fatigue, unspecified type   2. Dyspnea on exertion   3. Generalized abdominal pain   4. Constipation, unspecified constipation type   5. Other microscopic hematuria   6. Upper back pain    Unknown etiology of sxs - Vitals, exam, EKG and CXR reassuring.  Pt did feel that her sxs improved after the albuterol neb though the peak flow is unchanged. Stat troponin, cbc, and cmp obtained - NORMAL. Abd xr shows constipation which pt admits is a chronic problem.  She will try a miralax cleanout - 14 doses today, soft diet, push fluids. She does have hematuria of unknown etiology and CVA tenderness bilaterally so could have kidney stone or other renal problem - she reports a h/o proteinuria of unknown etiology.  Discussed r/b of proceeding with abd/pelvic CT to look for etiology but pt prefers to proceed with miralax cleanout and very close f/u which I think is reasonable as there is nothing concerning for an acute abd or acute cardiovascular dz at this time. Sched pt to recheck with me in 48 hrs, to ER if worse in the meantime. Out of work until then.  Orders Placed This Encounter  Procedures  . Urine culture  . DG Chest 2 View    Standing Status:   Future    Number of Occurrences:   1    Standing Expiration Date:   11/29/2017    Order Specific Question:   Reason for Exam (SYMPTOM  OR DIAGNOSIS REQUIRED)    Answer:   back pain, shortness of breath.    Order Specific Question:   Is the patient pregnant?    Answer:   No    Order Specific Question:   Preferred imaging location?    Answer:   External  . DG Abd 1 View     Standing Status:   Future    Number of Occurrences:   1    Standing Expiration Date:   11/29/2017    Order Specific Question:   Reason for Exam (SYMPTOM  OR DIAGNOSIS REQUIRED)  Answer:   diffuse abd pain and cva tenderness    Order Specific Question:   Is the patient pregnant?    Answer:   No    Order Specific Question:   Preferred imaging location?    Answer:   External  . Brain natriuretic peptide  . CBC with Differential/Platelet  . Comprehensive metabolic panel  . CK  . Troponin I  . C-reactive protein  . C-reactive protein  . CK  . Care order/instruction:    Scheduling Instructions:     Peak Flow (IF NEB IS ORDERED PLEASE DO BEFORE AND AFTER NEB)  . Care order/instruction:    AVS printed - let patient go!  Marland Kitchen POCT urinalysis dipstick  . POCT Microscopic Urinalysis ([facility])  . POCT SEDIMENTATION RATE  . EKG 12-Lead    Meds ordered this encounter  Medications  . albuterol (PROVENTIL) (2.5 MG/3ML) 0.083% nebulizer solution 2.5 mg  . ipratropium (ATROVENT) nebulizer solution 0.5 mg   Over 40 min spent in face-to-face evaluation of and consultation with patient and coordination of care.  Over 50% of this time was spent counseling this patient.  I personally performed the services described in this documentation, which was scribed in my presence. The recorded information has been reviewed and considered, and addended by me as needed.   Delman Cheadle, M.D.  Primary Care at [facility] [address] [place], [place] [address] [phone] phone [phone] fax  11/30/16 4:34 AM  Results for orders placed or performed in visit on 11/29/16  CBC with Differential/Platelet  Result Value Ref Range   WBC 6.3 3.4 - 10.8 x10E3/uL   RBC 4.67 3.77 - 5.28 x10E6/uL   Hemoglobin 11.8 11.1 - 15.9 g/dL   Hematocrit 34.6 34.0 - 46.6 %   MCV 74 (L) 79 - 97 fL   MCH 25.3 (L) 26.6 - 33.0 pg   MCHC 34.1 31.5 - 35.7 g/dL   RDW 15.0 12.3 - 15.4 %   Platelets 226 150 - 379  x10E3/uL   Neutrophils 55 Not Estab. %   Lymphs 35 Not Estab. %   Monocytes 6 Not Estab. %   Eos 3 Not Estab. %   Basos 1 Not Estab. %   Neutrophils Absolute 3.5 1.4 - 7.0 x10E3/uL   Lymphocytes Absolute 2.2 0.7 - 3.1 x10E3/uL   Monocytes Absolute 0.4 0.1 - 0.9 x10E3/uL   EOS (ABSOLUTE) 0.2 0.0 - 0.4 x10E3/uL   Basophils Absolute 0.0 0.0 - 0.2 x10E3/uL  Comprehensive metabolic panel  Result Value Ref Range   Glucose 84 65 - 99 mg/dL   BUN 20 6 - 24 mg/dL   Creatinine, Ser 1.22 (H) 0.57 - 1.00 mg/dL   GFR calc non Af Amer 50 (L) >59 mL/min/1.73   GFR calc Af Amer 58 (L) >59 mL/min/1.73   BUN/Creatinine Ratio 16 9 - 23   Sodium 141 134 - 144 mmol/L   Potassium 5.0 3.5 - 5.2 mmol/L   Chloride 101 96 - 106 mmol/L   CO2 30 (H) 18 - 29 mmol/L   Calcium 10.1 8.7 - 10.2 mg/dL   Total Protein 8.0 6.0 - 8.5 g/dL   Albumin 4.7 3.5 - 5.5 g/dL   Globulin, Total 3.3 1.5 - 4.5 g/dL   Albumin/Globulin Ratio 1.4 1.2 - 2.2   Bilirubin Total <0.2 0.0 - 1.2 mg/dL   Alkaline Phosphatase 72 39 - 117 IU/L   AST 21 0 - 40 IU/L   ALT 11 0 - 32  IU/L  Troponin I  Result Value Ref Range   Troponin I <0.01 0.00 - 0.04 ng/mL  C-reactive protein  Result Value Ref Range   CRP 3.0 0.0 - 4.9 mg/L  CK  Result Value Ref Range   Total CK 172 24 - 173 U/L  POCT urinalysis dipstick  Result Value Ref Range   Color, UA yellow yellow   Clarity, UA clear clear   Glucose, UA negative negative   Bilirubin, UA negative negative   Ketones, POC UA negative negative   Spec Grav, UA 1.015 1.030 - 1.035   Austin, UA moderate (A) negative   pH, UA 6.0 5.0 - 8.0   Protein Ur, POC trace (A) negative   Urobilinogen, UA 0.2 Negative - 2.0   Nitrite, UA Negative Negative   Leukocytes, UA Negative Negative  POCT Microscopic Urinalysis ([facility])  Result Value Ref Range   WBC,UR,HPF,POC None None WBC/hpf   RBC,UR,HPF,POC Few (A) None RBC/hpf   Bacteria None None, Too numerous to count   Mucus Absent Absent   Epithelial  Cells, UR Per Microscopy Moderate (A) None, Too numerous to count cells/hpf  POCT SEDIMENTATION RATE  Result Value Ref Range   POCT SED RATE 43 (A) 0 - 22 mm/hr

## 2016-11-29 NOTE — Patient Instructions (Signed)
I recommend doing a miralax clean-out. Put 14 (not kidding) doses of miralax (polyethylene glycol) into 64 oz of any clear, non-carbonated liquid (apple juice, gatorade, water) and drink this WITHIN 24 HOURS!!!!  Try to drink this all today. For the next 2d, plan to stay home and just hang around the toilet as you will hopefully be having 8 BM/day of LIQUID stool.  If the diarrhea occurs soon after starting process without passing a significant stool volume or if you are having any fecal incontinence you should keep going as this may be the initial miralax washing around the larger stools.   Hematuria, Adult Hematuria is blood in your urine. It can be caused by a bladder infection, kidney infection, prostate infection, kidney stone, or cancer of your urinary tract. Infections can usually be treated with medicine, and a kidney stone usually will pass through your urine. If neither of these is the cause of your hematuria, further workup to find out the reason may be needed. It is very important that you tell your health care provider about any blood you see in your urine, even if the blood stops without treatment or happens without causing pain. Blood in your urine that happens and then stops and then happens again can be a symptom of a very serious condition. Also, pain is not a symptom in the initial stages of many urinary cancers. Follow these instructions at home:  Drink lots of fluid, 3-4 quarts a day. If you have been diagnosed with an infection, cranberry juice is especially recommended, in addition to large amounts of water.  Avoid caffeine, tea, and carbonated beverages because they tend to irritate the bladder.  Avoid alcohol because it may irritate the prostate.  Take all medicines as directed by your health care provider.  If you were prescribed an antibiotic medicine, finish it all even if you start to feel better.  If you have been diagnosed with a kidney stone, follow your health care  provider's instructions regarding straining your urine to catch the stone.  Empty your bladder often. Avoid holding urine for long periods of time.  After a bowel movement, women should cleanse front to back. Use each tissue only once.  Empty your bladder before and after sexual intercourse if you are a female. Contact a health care provider if:  You develop back pain.  You have a fever.  You have a feeling of sickness in your stomach (nausea) or vomiting.  Your symptoms are not better in 3 days. Return sooner if you are getting worse. Get help right away if:  You develop severe vomiting and are unable to keep the medicine down.  You develop severe back or abdominal pain despite taking your medicines.  You begin passing a large amount of blood or clots in your urine.  You feel extremely weak or faint, or you pass out. This information is not intended to replace advice given to you by your health care provider. Make sure you discuss any questions you have with your health care provider. Document Released: 08/22/2005 Document Revised: 01/28/2016 Document Reviewed: 04/22/2013 Elsevier Interactive Patient Education  2017 [employer].  Constipation, Adult Constipation is when a person has fewer bowel movements in a week than normal, has difficulty having a bowel movement, or has stools that are dry, hard, or larger than normal. Constipation may be caused by an underlying condition. It may become worse with age if a person takes certain medicines and does not take in enough fluids. Follow  these instructions at home: Eating and drinking    Eat foods that have a lot of fiber, such as fresh fruits and vegetables, whole grains, and beans.  Limit foods that are high in fat, low in fiber, or overly processed, such as french fries, hamburgers, cookies, candies, and soda.  Drink enough fluid to keep your urine clear or pale yellow. General instructions   Exercise regularly or as told by  your health care provider.  Go to the restroom when you have the urge to go. Do not hold it in.  Take over-the-counter and prescription medicines only as told by your health care provider. These include any fiber supplements.  Practice pelvic floor retraining exercises, such as deep breathing while relaxing the lower abdomen and pelvic floor relaxation during bowel movements.  Watch your condition for any changes.  Keep all follow-up visits as told by your health care provider. This is important. Contact a health care provider if:  You have pain that gets worse.  You have a fever.  You do not have a bowel movement after 4 days.  You vomit.  You are not hungry.  You lose weight.  You are bleeding from the anus.  You have thin, pencil-like stools. Get help right away if:  You have a fever and your symptoms suddenly get worse.  You leak stool or have blood in your stool.  Your abdomen is bloated.  You have severe pain in your abdomen.  You feel dizzy or you faint. This information is not intended to replace advice given to you by your health care provider. Make sure you discuss any questions you have with your health care provider. Document Released: 05/20/2004 Document Revised: 03/11/2016 Document Reviewed: 02/10/2016 Elsevier Interactive Patient Education  2017 [employer].

## 2016-11-30 LAB — CK: Total CK: 172 U/L (ref 24–173)

## 2016-11-30 LAB — TROPONIN I: Troponin I: <0.01 ng/mL (ref 0.00–0.04)

## 2016-11-30 LAB — BRAIN NATRIURETIC PEPTIDE: BNP: 12.4 pg/mL (ref 0.0–100.0)

## 2016-11-30 LAB — C-REACTIVE PROTEIN: CRP: 3 mg/L (ref 0.0–4.9)

## 2016-12-01 ENCOUNTER — Ambulatory Visit: Payer: BC Managed Care – PPO | Admitting: Family Medicine

## 2016-12-01 LAB — URINE CULTURE: Organism ID, Bacteria: NO GROWTH

## 2016-12-17 ENCOUNTER — Ambulatory Visit: Payer: BC Managed Care – PPO

## 2017-01-20 ENCOUNTER — Ambulatory Visit (INDEPENDENT_AMBULATORY_CARE_PROVIDER_SITE_OTHER): Payer: BC Managed Care – PPO | Admitting: Internal Medicine

## 2017-01-20 ENCOUNTER — Encounter: Payer: Self-pay | Admitting: Internal Medicine

## 2017-01-20 VITALS — BP 112/66 | HR 82 | Ht 66.0 in | Wt 139.2 lb

## 2017-01-20 DIAGNOSIS — R053 Chronic cough: Secondary | ICD-10-CM | POA: Insufficient documentation

## 2017-01-20 DIAGNOSIS — R490 Dysphonia: Secondary | ICD-10-CM | POA: Diagnosis not present

## 2017-01-20 DIAGNOSIS — R05 Cough: Secondary | ICD-10-CM | POA: Diagnosis not present

## 2017-01-20 NOTE — Progress Notes (Addendum)
   Subjective:    Patient ID: Aimee Austin, female    DOB: 1961-05-25, 56 y.o.   MRN: [id number]  HPI  Per CMA Jonelle Sidle: she went to give instruction to patient. Patient refused to shjare renal doc name and number. REfused to stop lisinopril   Dr. Brand Males, M.D., [facility].C.P Pulmonary and Critical Care Medicine Staff Physician [facility] Pager: [phone], If no answer or between  15:00h - 7:00h: call [phone]  01/20/2017 12:00 PM     Review of Systems  Constitutional: Positive for unexpected weight change. Negative for fever.  HENT: Positive for congestion, postnasal drip, sinus pressure, sneezing and sore throat. Negative for dental problem, ear pain, nosebleeds, rhinorrhea and trouble swallowing.   Eyes: Negative for redness and itching.  Respiratory: Positive for cough and wheezing. Negative for chest tightness and shortness of breath.   Cardiovascular: Positive for leg swelling. Negative for palpitations.  Gastrointestinal: Negative for nausea and vomiting.  Genitourinary: Negative for dysuria.  Musculoskeletal: Positive for joint swelling.  Skin: Negative for rash.  Neurological: Positive for headaches.  Hematological: Bruises/bleeds easily.  Psychiatric/Behavioral: Negative for dysphoric mood. The patient is not nervous/anxious.        Objective:   Physical Exam        Assessment & Plan:

## 2017-01-20 NOTE — Progress Notes (Signed)
Subjective:     Patient ID: Aimee Austin, female   DOB: 1961-07-24, 56 y.o.   MRN: [id number]   HPI  IOV 01/20/2017  Chief Complaint  Patient presents with  . 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 In the year 2000 she says she had an ENT evaluation in the [place] area and was suggested she have tonsillectomy because of enlarged tonsils. But subsequently well. Then in the year 2008 got placed on lisinopril by a nephrologist in the  [facility] area for proteinuria. He has been doing well. Now for the last 2 months she's had hoarseness of voice that is extremely severe. She is hardly able to talk without a squeak. Undulating voice. No recent ENT evaluation. Then for the last 1 month she has cough. The hoarseness of voice is severe. The cough is mild to moderate. It is dry. There is no associated wheezing. She feels she cannot do spirometry. No fever no chills no nausea no vomiting or diarrhea    CXR 11/29/16 CLINICAL DATA:  Fatigue.  Back pain and shortness of breath  EXAM: CHEST  2 V  IEW  COMPARISON:  06/09/2015  FINDINGS: Normal heart size and mediastinal contours. No acute infiltrate or edema. No effusion or pneumothorax. Mild levoscoliosis. No acute osseous findings.  IMPRESSION: No evidence of active disease.   Electronically Signed   By: Monte Fantasia M.D.   On: 11/29/2016 12:58  Results for JANVI, AMMAR (MRN [id number]) as of 01/20/2017 11:14  Ref. Range 11/29/2016 14:14  POCT SED RATE Latest Ref Range: 0 - 22 mm/hr 43 (A)   Results for RICK, WARNICK (MRN [id number]) as of 01/20/2017 11:14  Ref. Range 12/23/2012 02:15 12/23/2012 02:23 11/29/2016 12:43 11/29/2016 12:54 11/29/2016 14:08  Creatinine Latest Ref Range: 0.57 - 1.00 mg/dL 1.30 (H)   1.22 (H)   Results for JERRI, GLAUSER (MRN [id number]) as of 01/20/2017 11:14  Ref. Range 12/23/2012 02:15 12/23/2012 02:23 11/29/2016 12:43  11/29/2016 12:54 11/29/2016 14:08  Hemoglobin Latest Ref Range: 11.1 - 15.9 g/dL 11.6 (L)   11.8   Results for CATLIN, DORIA (MRN [id number]) as of 01/20/2017 11:14  Ref. Range 12/23/2012 02:15 12/23/2012 02:23 11/29/2016 12:43 11/29/2016 12:54 11/29/2016 14:08  Troponin I Latest Ref Range: 0.00 - 0.04 ng/mL     <0.01     has a past medical history of Anemia; Arthritis; Asthma; Eczema; Renal disorder; and Strep throat.   reports that she has never smoked. She has never used smokeless tobacco.  Past Surgical History:  Procedure Laterality Date  . ABDOMINAL HYSTERECTOMY  03/1998   Uterus only    Allergies  Allergen Reactions  . Shellfish Allergy Anaphylaxis    Immunization History  Administered Date(s) Administered  . PPD Test 11/20/2013    Family History  Problem Relation Age of Onset  . Adopted: Yes     Current Outpatient Prescriptions:  .  albuterol (PROVENTIL HFA;VENTOLIN HFA) 108 (90 BASE) MCG/ACT inhaler, Inhale 2 puffs into the lungs every 6 (six) hours as needed for shortness of breath. For shortness of breath, Disp: , Rfl:  .  lisinopril (PRINIVIL,ZESTRIL) 10 MG tablet, Take 10 mg by mouth every evening. , Disp: , Rfl:     Review of Systems     Objective:   Physical Exam  Constitutional: She is oriented to person, place, and time. She appears well-developed and well-nourished. No distress.  HENT:  Head: Normocephalic and atraumatic.  Right Ear: External ear normal.  Left Ear: External ear normal.  Mouth/Throat: Oropharynx is clear and moist. No oropharyngeal exudate.  Extremely hoarse voice. Unable to articulate without squeaking voice  Eyes: Conjunctivae and EOM are normal. Pupils are equal, round, and reactive to light. Right eye exhibits no discharge. Left eye exhibits no discharge. No scleral icterus.  Neck: Normal range of motion. Neck supple. No JVD present. No tracheal deviation present. No thyromegaly present.  Cardiovascular: Normal rate, regular rhythm,  normal heart sounds and intact distal pulses.  Exam reveals no gallop and no friction rub.   No murmur heard. Pulmonary/Chest: Effort normal and breath sounds normal. No respiratory distress. She has no wheezes. She has no rales. She exhibits no tenderness.  Abdominal: Soft. Bowel sounds are normal. She exhibits no distension and no mass. There is no tenderness. There is no rebound and no guarding.  Musculoskeletal: Normal range of motion. She exhibits no edema or tenderness.  Lymphadenopathy:    She has no cervical adenopathy.  Neurological: She is alert and oriented to person, place, and time. She has normal reflexes. No cranial nerve deficit. She exhibits normal muscle tone. Coordination normal.  Skin: Skin is warm and dry. No rash noted. She is not diaphoretic. No erythema. No pallor.  Psychiatric: She has a normal mood and affect. Her behavior is normal. Judgment and thought content normal.  Vitals reviewed.  Vitals:   01/20/17 1111  BP: 112/66  Pulse: 82  SpO2: 98%  Weight: 139 lb 3.2 oz (63.1 kg)  Height: 5\' 6"  (1.676 m)    Estimated body mass index is 22.47 kg/m as calculated from the following:   Height as of this encounter: 5\' 6"  (1.676 m).   Weight as of this encounter: 139 lb 3.2 oz (63.1 kg).     Assessment:       ICD-9-CM ICD-10-CM   1. Hoarseness of voice 784.42 R49.0   2. Chronic cough 786.2 R05        Plan:     Discussed with the patient that this could well be cough neuropathy but is a diagnosis of exclusion. And that lisinopril and he'll be the culprit Either in part or and full. I did advise her that without stopping the lisinopril. Very difficult to make further inroads. Having said that the hoarseness of voice is significant enough that she needs an independent ENT evaluation critically in the context that 18 years ago somebody told her that she needed tonsillectomy and her tonsils in the seemed pretty prominent today. She is somewhat reluctantly agreed to  give lisinopril holiday but have cautioned her that she needs this check with the renal doctors.. She is on lisinopril for proteinuria. I've asked her to do her renal doctor's name and number so we can also communicate. Meanwhile have asked her to directly reach to the renal physicians to see if one month holiday would be safe.  Follow up in 1 month  PS: Patient seemed to be in disbelief about my A/P . Have offered her 2nd opinion but as of last interaction agreed to above   Dr. Brand Males, M.D., [facility] Pulmonary and Critical Care Medicine Staff Physician [facility] Pager: [phone], If no answer or between  15:00h - 7:00h: call [phone]  01/20/2017 11:38 AM

## 2017-01-20 NOTE — Patient Instructions (Signed)
ICD-9-CM ICD-10-CM   1. Hoarseness of voice 784.42 R49.0   2. Chronic cough 786.2 R05     Stop lisinopril for 1 month atleast - please give your renal doc number and my office can call them Refer ENT Cough neuropathy is a possibility but a diagnosis of exclusion  Followup 4-6 weeks with NP Tammy or myself   - if symptms persist will cosider FeNO testing. CT chest/sinus depending on situation

## 2017-04-26 ENCOUNTER — Other Ambulatory Visit: Payer: Self-pay | Admitting: Internal Medicine

## 2017-04-26 DIAGNOSIS — R3121 Asymptomatic microscopic hematuria: Secondary | ICD-10-CM

## 2017-05-11 ENCOUNTER — Ambulatory Visit
Admission: RE | Admit: 2017-05-11 | Discharge: 2017-05-11 | Disposition: A | Discharge status: Home / Self Care | Payer: BC Managed Care – PPO | Source: Ambulatory Visit | Attending: Internal Medicine | Admitting: Internal Medicine

## 2017-05-11 DIAGNOSIS — R3121 Asymptomatic microscopic hematuria: Secondary | ICD-10-CM

## 2017-07-10 ENCOUNTER — Other Ambulatory Visit: Payer: Self-pay | Admitting: Internal Medicine

## 2017-07-10 DIAGNOSIS — Z1231 Encounter for screening mammogram for malignant neoplasm of breast: Secondary | ICD-10-CM

## 2017-08-09 ENCOUNTER — Ambulatory Visit: Payer: BC Managed Care – PPO

## 2017-08-10 ENCOUNTER — Ambulatory Visit
Admission: RE | Admit: 2017-08-10 | Discharge: 2017-08-10 | Disposition: A | Discharge status: Home / Self Care | Payer: BC Managed Care – PPO | Source: Ambulatory Visit | Attending: Internal Medicine | Admitting: Internal Medicine

## 2017-08-10 DIAGNOSIS — Z1231 Encounter for screening mammogram for malignant neoplasm of breast: Secondary | ICD-10-CM

## 2017-08-31 ENCOUNTER — Other Ambulatory Visit: Payer: Self-pay | Admitting: Internal Medicine

## 2017-08-31 ENCOUNTER — Ambulatory Visit
Admission: RE | Admit: 2017-08-31 | Discharge: 2017-08-31 | Disposition: A | Discharge status: Home / Self Care | Payer: BC Managed Care – PPO | Source: Ambulatory Visit | Attending: Internal Medicine | Admitting: Internal Medicine

## 2017-08-31 DIAGNOSIS — R0602 Shortness of breath: Secondary | ICD-10-CM

## 2018-06-26 ENCOUNTER — Encounter: Payer: Self-pay | Admitting: Internal Medicine

## 2018-06-26 ENCOUNTER — Ambulatory Visit
Admission: RE | Admit: 2018-06-26 | Discharge: 2018-06-26 | Disposition: A | Discharge status: Home / Self Care | Payer: BC Managed Care – PPO | Source: Ambulatory Visit | Attending: Internal Medicine | Admitting: Internal Medicine

## 2018-06-26 ENCOUNTER — Ambulatory Visit: Payer: BC Managed Care – PPO | Admitting: Internal Medicine

## 2018-06-26 VITALS — BP 118/78 | HR 73 | Temp 97.8°F | Ht 66.0 in | Wt 141.0 lb

## 2018-06-26 DIAGNOSIS — N644 Mastodynia: Secondary | ICD-10-CM

## 2018-06-26 DIAGNOSIS — J40 Bronchitis, not specified as acute or chronic: Secondary | ICD-10-CM

## 2018-06-26 DIAGNOSIS — R062 Wheezing: Secondary | ICD-10-CM

## 2018-06-26 DIAGNOSIS — J399 Disease of upper respiratory tract, unspecified: Secondary | ICD-10-CM

## 2018-06-26 DIAGNOSIS — E049 Nontoxic goiter, unspecified: Secondary | ICD-10-CM | POA: Diagnosis not present

## 2018-06-26 MED ORDER — PREDNISONE 20 MG PO TABS: ORAL_TABLET | ORAL | 0 refills | Status: DC

## 2018-06-26 MED ORDER — IPRATROPIUM-ALBUTEROL 0.5-2.5 (3) MG/3ML IN SOLN
3.0000 mL | Freq: Once | RESPIRATORY_TRACT | Status: AC
  Administered 2018-06-26: 3 mL via RESPIRATORY_TRACT

## 2018-06-26 MED ORDER — AZITHROMYCIN 250 MG PO TABS: ORAL_TABLET | ORAL | 0 refills | Status: AC

## 2018-06-26 NOTE — Progress Notes (Signed)
Subjective:     Patient ID: Aimee Austin , female    DOB: Sep 08, 1960 , 57 y.o.   MRN: [id number] HPI  1- Onset of nose congestion 1 week ago, then body aches and cough x 4 days. Cough is on and off  And non productive. Has noticed trouble exhaling all the way and is a little SOB. Has been using her inhaler x 1 week. Felt SOB last week while in school which helped best.  Denies long car rides or flights in the past month.  Has had night sweats, fever and chills.   2- R breast pain x 3 days ago and since then is getting worse, and she gets shooting pains that feels achy and lasts 2 minutes or less and may get 10+ times/ day. She is up to date on her mammogram. Has not felt any lumps.   Past Medical History:  Diagnosis Date  . Anemia   . Arthritis    Rheumatoid Arthritis 2010  . Asthma   . Eczema   . Renal disorder   . Strep throat     Current Outpatient Medications:  .  albuterol (PROVENTIL HFA;VENTOLIN HFA) 108 (90 BASE) MCG/ACT inhaler, Inhale 2 puffs into the lungs every 6 (six) hours as needed for shortness of breath. For shortness of breath, Disp: , Rfl:  .  hydrOXYzine (ATARAX/VISTARIL) 25 MG tablet, Take 25 mg by mouth as needed., Disp: , Rfl: 3 .  lisinopril (PRINIVIL,ZESTRIL) 10 MG tablet, Take 10 mg by mouth every evening. , Disp: , Rfl:    Allergies  Allergen Reactions  . Shellfish Allergy Anaphylaxis     Review of Systems  Constitutional: Positive for fatigue. Negative for chills, diaphoresis and unexpected weight change.  HENT: Positive for postnasal drip and rhinorrhea. Negative for ear discharge, ear pain and sore throat.   Eyes: Negative for discharge.  Respiratory: Positive for cough, shortness of breath and wheezing. Negative for chest tightness.   Cardiovascular: Negative for chest pain and leg swelling.  Musculoskeletal: Positive for arthralgias.       Neck/ thyroid is tender x 1 day  Neurological: Positive for headaches.  Hematological: Negative for  adenopathy.     Today's Vitals   06/26/18 1038  BP: 118/78  Pulse: 73  Temp: 97.8 F (36.6 C)  TempSrc: Oral  SpO2: 96%  Weight: 141 lb (64 kg)  Height: 5\' 6"  (1.676 m)   Body mass index is 22.76 kg/m.   Objective:  Physical Exam  Constitutional: She is oriented to person, place, and time. She appears well-developed and well-nourished. No distress.  HENT:  Head: Normocephalic.  Right Ear: External ear normal.  Left Ear: External ear normal.  Nose: Nose normal.  Mouth/Throat: Oropharynx is clear and moist. No oropharyngeal exudate.  Eyes: Conjunctivae are normal. Right eye exhibits no discharge. Left eye exhibits no discharge. No scleral icterus.  Neck: Neck supple. Thyromegaly present.  Her thyroid is a little tender  Cardiovascular: Normal rate and regular rhythm.  No murmur heard. Pulmonary/Chest: No stridor. No respiratory distress. She has no rales.  Has Expiratory wheezes on LLL, and cant exhale very long. But after duo neb the wheezing resolved and was able to do prolonged exhalation. Was feeling she could breath better after the duo neb. Her pulse ox after neb stayed at 94%  Lymphadenopathy:    She has no cervical adenopathy.  Neurological: She is alert and oriented to person, place, and time.  Skin: Skin is warm and dry.  She is not diaphoretic.  Psychiatric: She has a normal mood and affect. Her behavior is normal. Judgment and thought content normal.    Assessment And Plan:     1. Bronchitis- CXR was neg. I placed her on Z-pack and prednisone. Fu if she gets worse.   2. Pain of right breast- acute. R breast US ordered.  3- History of Thyroidmegaly- will await to get better and if still tender, will  come back for a visit. Has had benign thyroid biopsy 2017.          Maliki Gignac RODRIGUEZ-SOUTHWORTH, PA-C

## 2018-06-27 ENCOUNTER — Telehealth: Payer: Self-pay | Admitting: Internal Medicine

## 2018-06-27 ENCOUNTER — Other Ambulatory Visit: Payer: Self-pay | Admitting: Internal Medicine

## 2018-06-27 ENCOUNTER — Encounter: Payer: Self-pay | Admitting: Internal Medicine

## 2018-06-27 DIAGNOSIS — N644 Mastodynia: Secondary | ICD-10-CM

## 2018-06-27 NOTE — Telephone Encounter (Signed)
Phone note only

## 2018-07-09 ENCOUNTER — Ambulatory Visit: Payer: BC Managed Care – PPO

## 2018-07-09 ENCOUNTER — Ambulatory Visit
Admission: RE | Admit: 2018-07-09 | Discharge: 2018-07-09 | Disposition: A | Discharge status: Home / Self Care | Payer: BC Managed Care – PPO | Source: Ambulatory Visit | Attending: Internal Medicine | Admitting: Internal Medicine

## 2018-07-09 DIAGNOSIS — N644 Mastodynia: Secondary | ICD-10-CM

## 2018-08-01 ENCOUNTER — Other Ambulatory Visit: Payer: Self-pay | Admitting: Nurse Practitioner

## 2018-08-13 ENCOUNTER — Ambulatory Visit: Payer: BC Managed Care – PPO | Admitting: Internal Medicine

## 2018-08-13 ENCOUNTER — Encounter: Payer: Self-pay | Admitting: Internal Medicine

## 2018-08-13 VITALS — BP 124/70 | HR 98 | Temp 98.2°F | Ht 65.5 in | Wt 141.6 lb

## 2018-08-13 DIAGNOSIS — D582 Other hemoglobinopathies: Secondary | ICD-10-CM | POA: Diagnosis not present

## 2018-08-13 DIAGNOSIS — L609 Nail disorder, unspecified: Secondary | ICD-10-CM

## 2018-08-13 DIAGNOSIS — R800 Isolated proteinuria: Secondary | ICD-10-CM | POA: Diagnosis not present

## 2018-08-13 DIAGNOSIS — R202 Paresthesia of skin: Secondary | ICD-10-CM | POA: Diagnosis not present

## 2018-08-13 DIAGNOSIS — Z0000 Encounter for general adult medical examination without abnormal findings: Secondary | ICD-10-CM | POA: Diagnosis not present

## 2018-08-13 LAB — POCT URINALYSIS DIPSTICK
Bilirubin, UA: NEGATIVE
Glucose, UA: NEGATIVE
Ketones, UA: NEGATIVE
Leukocytes, UA: NEGATIVE
Nitrite, UA: NEGATIVE
Protein, UA: POSITIVE — AB
Spec Grav, UA: 1.015 (ref 1.010–1.025)
UROBILINOGEN UA: 0.2 U/dL
pH, UA: 6.5 (ref 5.0–8.0)

## 2018-08-13 LAB — POCT UA - MICROALBUMIN
Creatinine, POC: 30 mg/dL
MICROALBUMIN (UR) POC: 80 mg/L

## 2018-08-13 NOTE — Patient Instructions (Signed)

## 2018-08-14 LAB — CMP14+EGFR
ALT: 10 IU/L (ref 0–32)
AST: 20 IU/L (ref 0–40)
Albumin/Globulin Ratio: 2 (ref 1.2–2.2)
Albumin: 4.7 g/dL (ref 3.5–5.5)
Alkaline Phosphatase: 69 IU/L (ref 39–117)
BUN/Creatinine Ratio: 15 (ref 9–23)
BUN: 19 mg/dL (ref 6–24)
Bilirubin Total: <0.2 mg/dL (ref 0.0–1.2)
CO2: 22 mmol/L (ref 20–29)
Calcium: 9.6 mg/dL (ref 8.7–10.2)
Chloride: 107 mmol/L — ABNORMAL HIGH (ref 96–106)
Creatinine, Ser: 1.27 mg/dL — ABNORMAL HIGH (ref 0.57–1.00)
GFR calc Af Amer: 55 mL/min/{1.73_m2} — ABNORMAL LOW (ref >59)
GFR calc non Af Amer: 48 mL/min/{1.73_m2} — ABNORMAL LOW (ref >59)
Globulin, Total: 2.3 g/dL (ref 1.5–4.5)
Glucose: 80 mg/dL (ref 65–99)
Potassium: 4.8 mmol/L (ref 3.5–5.2)
Sodium: 145 mmol/L — ABNORMAL HIGH (ref 134–144)
Total Protein: 7 g/dL (ref 6.0–8.5)

## 2018-08-14 LAB — CBC
Hematocrit: 30.5 % — ABNORMAL LOW (ref 34.0–46.6)
Hemoglobin: 10.1 g/dL — ABNORMAL LOW (ref 11.1–15.9)
MCH: 25.6 pg — ABNORMAL LOW (ref 26.6–33.0)
MCHC: 33.1 g/dL (ref 31.5–35.7)
MCV: 77 fL — ABNORMAL LOW (ref 79–97)
Platelets: 189 10*3/uL (ref 150–450)
RBC: 3.95 x10E6/uL (ref 3.77–5.28)
RDW: 14.2 % (ref 12.3–15.4)
WBC: 7.4 10*3/uL (ref 3.4–10.8)

## 2018-08-14 LAB — HEPATITIS C ANTIBODY: Hep C Virus Ab: <0.1 s/co ratio (ref 0.0–0.9)

## 2018-08-14 LAB — HEMOGLOBIN A1C
ESTIMATED AVERAGE GLUCOSE: 117 mg/dL
Hgb A1c MFr Bld: 5.7 % — ABNORMAL HIGH (ref 4.8–5.6)

## 2018-08-14 LAB — LIPID PANEL
Chol/HDL Ratio: 6 ratio — ABNORMAL HIGH (ref 0.0–4.4)
Cholesterol, Total: 251 mg/dL — ABNORMAL HIGH (ref 100–199)
HDL: 42 mg/dL (ref >39)
LDL Calculated: 156 mg/dL — ABNORMAL HIGH (ref 0–99)
Triglycerides: 267 mg/dL — ABNORMAL HIGH (ref 0–149)
VLDL CHOLESTEROL CAL: 53 mg/dL — AB (ref 5–40)

## 2018-08-14 LAB — CYCLIC CITRUL PEPTIDE ANTIBODY, IGG/IGA: Cyclic Citrullin Peptide Ab: 8 units (ref 0–19)

## 2018-08-14 LAB — ANTINUCLEAR ANTIBODIES, IFA: ANA Titer 1: NEGATIVE

## 2018-08-15 NOTE — Progress Notes (Signed)
Here are your lab results:  You are negative for hepatitis c virus.  Your kidney function is stable. However, your sodium level is elevated, which implies that you are slightly dehydrated. Your liver function is normal.   Your blood count is slightly low. Have you been taking any iron supplements? Your cholesterol is quite elevated. Your triglycerides are also elevated; which implies that you do not get enough exercise and you are eating too many processed foods.   Your hba1c is 5.7, this is in prediabetes range. The arthritis panel is negative.   Happy holidays to you and your family! You should hear about your referral next week.   Sincerely,    Jame Seelig N. [facility], MD

## 2018-08-19 ENCOUNTER — Encounter: Payer: Self-pay | Admitting: Internal Medicine

## 2018-08-19 NOTE — Progress Notes (Signed)
Subjective:     Patient ID: Aimee Austin , female    DOB: 02/01/1961 , 57 y.o.   MRN: [id number]   Chief Complaint  Patient presents with  . Annual Exam    HPI  She is here today for a full physical exam. She hs been going to Gyn for her pelvic exams. She is not sure if she is going to return. She denies having any vaginal issues at this time.     Past Medical History:  Diagnosis Date  . Anemia   . Arthritis    Rheumatoid Arthritis 2010  . Asthma   . Eczema   . Renal disorder   . Strep throat      Family History  Adopted: Yes     Current Outpatient Medications:  .  albuterol (PROVENTIL HFA;VENTOLIN HFA) 108 (90 BASE) MCG/ACT inhaler, Inhale 2 puffs into the lungs every 6 (six) hours as needed for shortness of breath. For shortness of breath, Disp: , Rfl:  .  hydrOXYzine (ATARAX/VISTARIL) 25 MG tablet, Take 25 mg by mouth as needed., Disp: , Rfl: 3 .  lisinopril (PRINIVIL,ZESTRIL) 10 MG tablet, TAKE 1 TABLET BY MOUTH ONCE DAILY, Disp: 90 tablet, Rfl: 0   Allergies  Allergen Reactions  . Shellfish Allergy Anaphylaxis      No LMP recorded. Patient has had a hysterectomy..  Negative for: breast discharge, breast lump(s), breast pain and breast self exam. Associated symptoms include abnormal vaginal bleeding. Pertinent negatives include abnormal bleeding (hematology), anxiety, decreased libido, depression, difficulty falling sleep, dyspareunia, history of infertility, nocturia, sexual dysfunction, sleep disturbances, urinary incontinence, urinary urgency, vaginal discharge and vaginal itching. Diet regular.The patient states her exercise level is    . The patient's tobacco use is:  Social History   Tobacco Use  Smoking Status Never Smoker  Smokeless Tobacco Never Used  . She has been exposed to passive smoke. The patient's alcohol use is:  Social History   Substance and Sexual Activity  Alcohol Use No  . Alcohol/week: 0.0 standard drinks  . Additional information:  Last pap - possibly 2 years ago)  Review of Systems  Constitutional: Negative.   HENT: Negative.   Eyes: Negative.   Respiratory: Negative.   Cardiovascular: Negative.   Gastrointestinal: Negative.   Endocrine: Negative.   Genitourinary: Negative.   Musculoskeletal: Negative.   Skin: Negative.        C/o dark lines on her nails. They are not painful. She wants to know why they are there. She would like to have derm referral.   Allergic/Immunologic: Negative.   Neurological: Positive for numbness (she c/o numbness/tinglng of rue/rle - awakened w/ these sx on 12/4. she is not sure what triggered these sx. ).  Hematological: Negative.   Psychiatric/Behavioral: Negative.      Today's Vitals   08/13/18 1427  BP: 124/70  Pulse: 98  Temp: 98.2 F (36.8 C)  TempSrc: Oral  Weight: 141 lb 9.6 oz (64.2 kg)  Height: 5' 5.5" (1.664 m)   Body mass index is 23.21 kg/m.   Objective:  Physical Exam Vitals signs and nursing note reviewed.  Constitutional:      Appearance: Normal appearance. She is normal weight.  HENT:     Head: Normocephalic and atraumatic.     Right Ear: Tympanic membrane, ear canal and external ear normal.     Left Ear: Tympanic membrane, ear canal and external ear normal.     Nose: Nose normal.     Mouth/Throat:       Mouth: Mucous membranes are moist.     Pharynx: Oropharynx is clear. No oropharyngeal exudate or posterior oropharyngeal erythema.  Eyes:     Conjunctiva/sclera: Conjunctivae normal.     Pupils: Pupils are equal, round, and reactive to light.  Neck:     Musculoskeletal: Normal range of motion.  Cardiovascular:     Rate and Rhythm: Normal rate and regular rhythm.     Pulses: Normal pulses.     Heart sounds: Normal heart sounds.  Pulmonary:     Effort: Pulmonary effort is normal.     Breath sounds: Normal breath sounds.  Abdominal:     General: Abdomen is flat. Bowel sounds are normal.  Genitourinary:    Comments: deferred Musculoskeletal:  Normal range of motion.  Skin:    General: Skin is warm and dry.     Capillary Refill: Capillary refill takes less than 2 seconds.     Comments: Hyperpigmented vertical lines on right 2nd/3rd fingers. Nontender.   Neurological:     General: No focal deficit present.     Mental Status: She is alert and oriented to person, place, and time.  Psychiatric:        Mood and Affect: Mood normal.         Assessment And Plan:     1. Routine general medical examination at health care facility  A full exam was performed.  Importance of monthly self breast exams was discussed with the patient.  PATIENT HAS BEEN ADVISED TO GET 30-45 MINUTES REGULAR EXERCISE NO LESS THAN FOUR TO FIVE DAYS PER WEEK - BOTH WEIGHTBEARING EXERCISES AND AEROBIC ARE RECOMMENDED.  SHE IS ADVISED TO FOLLOW A HEALTHY DIET WITH AT LEAST SIX FRUITS/VEGGIES PER DAY, DECREASE INTAKE OF RED MEAT, AND TO INCREASE FISH INTAKE TO TWO DAYS PER WEEK.  MEATS/FISH SHOULD NOT BE FRIED, BAKED OR BROILED IS PREFERABLE.  I SUGGEST WEARING SPF 50 SUNSCREEN ON EXPOSED PARTS AND ESPECIALLY WHEN IN THE DIRECT SUNLIGHT FOR AN EXTENDED PERIOD OF TIME.  PLEASE AVOID FAST FOOD RESTAURANTS AND INCREASE YOUR WATER INTAKE.  - Hepatitis C antibody - CMP14+EGFR - CBC - Lipid panel - Hemoglobin A1c  2. Nail abnormality  I will refer her to Derm as requested.   - Ambulatory referral to Dermatology  3. Paresthesia of right arm and leg  I will check labs as listed below. These sx may also be related to diagnosis #4. If persistent, I will consider further evaluation with nerve conduction study.   - ANA, IFA (with reflex) - CYCLIC CITRUL PEPTIDE ANTIBODY, IGG/IGA  4. Hemoglobin C trait (HCC)  Chronic, yet stable.   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 She is also followed by Nephrology in [place]. Importance of compliance with lisinopril was discussed with the patient.    - POCT Urinalysis Dipstick (40086) -  POCT UA - Microalbumin    Maximino Greenland, MD

## 2018-11-06 ENCOUNTER — Other Ambulatory Visit: Payer: Self-pay | Admitting: Internal Medicine

## 2018-12-18 ENCOUNTER — Telehealth: Payer: Self-pay

## 2018-12-18 NOTE — Telephone Encounter (Signed)
Patient called stating she is having trouble with her she stated they hurt when she keeps them open for a while and she would like to know what should she do.   Returned pt call and scheduled her an appointment. YRL,RMA

## 2018-12-20 ENCOUNTER — Other Ambulatory Visit: Payer: Self-pay

## 2018-12-20 ENCOUNTER — Ambulatory Visit: Payer: BC Managed Care – PPO | Admitting: Nurse Practitioner

## 2018-12-20 ENCOUNTER — Encounter: Payer: Self-pay | Admitting: Nurse Practitioner

## 2018-12-20 DIAGNOSIS — H538 Other visual disturbances: Secondary | ICD-10-CM | POA: Insufficient documentation

## 2018-12-20 DIAGNOSIS — H5713 Ocular pain, bilateral: Secondary | ICD-10-CM

## 2018-12-20 NOTE — Progress Notes (Signed)
Virtual Visit via Video Note    This visit type was conducted due to national recommendations for restrictions regarding the COVID-19 Pandemic (e.g. social distancing).  This format is felt to be most appropriate for this patient at this time.  All issues noted in this document were discussed and addressed.  No physical exam was performed (except for noted visual exam findings with Video Visits).  Please refer to the patient's chart (MyChart message for video visits and phone note for telephone visits) for the patient's consent to telehealth for [facility].  Failed video call  Date:  01/20/2019   ID:  Aimee Austin, DOB 1961/07/08, MRN [id number]  Patient Location:  Home - spoke with Russ Halo  Provider location:   Office  Chief Complaint:  Eye pain  History of Present Illness:    Aimee Austin is a 58 y.o. female who presents via video conferencing for a telehealth visit today.    The patient does not have symptoms concerning for COVID-19 infection (fever, chills, cough, or new shortness of breath).   She had not been wearing her glasses.  Rested her eyes the last 3 days.  They are much better today.  Denies drainage. Small amount of pressure behind her eyes.  She does have seasonal allergies   Eye Pain   Both eyes are affected.This is a new problem. The current episode started in the past 7 days. The pain is at a severity of 2/10. There is no known exposure to pink eye. She does not wear contacts. Pertinent negatives include no blurred vision (not blurry as much), eye discharge, double vision, nausea, photophobia or tingling. Associated symptoms comments: Worsening pain when trying to read and watch tv.  . Treatments tried: rested her eyes.     Past Medical History:  Diagnosis Date  . Anemia   . Arthritis    Rheumatoid Arthritis 2010  . Asthma   . Eczema   . Renal disorder   . Strep throat    Past Surgical History:  Procedure Laterality Date  . ABDOMINAL HYSTERECTOMY  03/1998    Uterus only     Current Meds  Medication Sig  . albuterol (PROVENTIL HFA;VENTOLIN HFA) 108 (90 BASE) MCG/ACT inhaler Inhale 2 puffs into the lungs every 6 (six) hours as needed for shortness of breath. For shortness of breath  . hydrOXYzine (ATARAX/VISTARIL) 25 MG tablet Take 25 mg by mouth as needed.  Marland Kitchen lisinopril (PRINIVIL,ZESTRIL) 10 MG tablet Take 1 tablet by mouth once daily     Allergies:   Shellfish allergy   Social History   Tobacco Use  . Smoking status: Never Smoker  . Smokeless tobacco: Never Used  Substance Use Topics  . Alcohol use: No    Alcohol/week: 0.0 standard drinks  . Drug use: No     Family Hx: The patient's family history includes Cancer in her mother. She was adopted.  ROS:   Please see the history of present illness.    Review of Systems  HENT: Negative.   Eyes: Positive for pain. Negative for blurred vision (not blurry as much), double vision, photophobia and discharge.  Respiratory: Negative.   Cardiovascular: Negative.   Gastrointestinal: Negative for nausea.  Musculoskeletal: Negative.   Neurological: Negative.  Negative for dizziness and tingling.    All other systems reviewed and are negative.   Labs/Other Tests and Data Reviewed:    Recent Labs: 08/13/2018: ALT 10; BUN 19; Creatinine, Ser 1.27; Hemoglobin 10.1; Platelets 189; Potassium 4.8;  Sodium 145   Recent Lipid Panel Lab Results  Component Value Date/Time   CHOL 251 (H) 08/13/2018 03:13 PM   TRIG 267 (H) 08/13/2018 03:13 PM   HDL 42 08/13/2018 03:13 PM   CHOLHDL 6.0 (H) 08/13/2018 03:13 PM   LDLCALC 156 (H) 08/13/2018 03:13 PM    Wt Readings from Last 3 Encounters:  08/13/18 141 lb 9.6 oz (64.2 kg)  06/26/18 141 lb (64 kg)  01/20/17 139 lb 3.2 oz (63.1 kg)     Exam:    Vital Signs:  There were no vitals taken for this visit.    Physical Exam  Constitutional: She is oriented to person, place, and time and well-developed, well-nourished, and in no distress.   Pulmonary/Chest: Effort normal.  Neurological: She is alert and oriented to person, place, and time.  Psychiatric: Mood, memory, affect and judgment normal.    ASSESSMENT & PLAN:     1. Pain of both eyes  Likely related to eye fatigue due to not wearing glasses   COVID-19 Education: The signs and symptoms of COVID-19 were discussed with the patient and how to seek care for testing (follow up with PCP or arrange E-visit).  The importance of social distancing was discussed today.  Patient Risk:   After full review of this patients clinical status, I feel that they are at least moderate risk at this time.  Time:   Today, I have spent  14 minutes with the patient with telehealth technology discussing above diagnoses     Medication Adjustments/Labs and Tests Ordered: Current medicines are reviewed at length with the patient today.  Concerns regarding medicines are outlined above.   Tests Ordered: No orders of the defined types were placed in this encounter.  Medication Changes: No orders of the defined types were placed in this encounter.   Disposition:  Follow up prn  Signed, Minette Brine, FNP

## 2019-01-20 ENCOUNTER — Encounter: Payer: Self-pay | Admitting: Nurse Practitioner

## 2019-02-02 ENCOUNTER — Other Ambulatory Visit: Payer: Self-pay | Admitting: Internal Medicine

## 2019-02-05 ENCOUNTER — Other Ambulatory Visit: Payer: Self-pay

## 2019-02-05 MED ORDER — LISINOPRIL 10 MG PO TABS: 10.0000 mg | ORAL_TABLET | Freq: Every day | ORAL | 0 refills | Status: DC

## 2019-02-25 ENCOUNTER — Ambulatory Visit: Payer: BC Managed Care – PPO | Admitting: Internal Medicine

## 2019-04-15 ENCOUNTER — Other Ambulatory Visit: Payer: Self-pay

## 2019-04-16 ENCOUNTER — Encounter: Payer: Self-pay | Admitting: Nurse Practitioner

## 2019-04-16 ENCOUNTER — Ambulatory Visit: Payer: BC Managed Care – PPO | Admitting: Nurse Practitioner

## 2019-04-16 VITALS — BP 118/76 | HR 87 | Temp 98.3°F | Ht 65.5 in | Wt 143.4 lb

## 2019-04-16 DIAGNOSIS — K219 Gastro-esophageal reflux disease without esophagitis: Secondary | ICD-10-CM | POA: Diagnosis not present

## 2019-04-16 MED ORDER — HYDROXYZINE HCL 25 MG PO TABS: 25.0000 mg | ORAL_TABLET | ORAL | 3 refills | Status: DC | PRN

## 2019-04-16 MED ORDER — ALBUTEROL SULFATE HFA 108 (90 BASE) MCG/ACT IN AERS: 2.0000 | INHALATION_SPRAY | Freq: Four times a day (QID) | RESPIRATORY_TRACT | 3 refills | Status: DC | PRN

## 2019-04-16 NOTE — Patient Instructions (Signed)

## 2019-04-16 NOTE — Progress Notes (Signed)
  Subjective:     Patient ID: Aimee Austin , female    DOB: 07/07/1961 , 58 y.o.   MRN: [id number]   Chief Complaint  Patient presents with  . sorness in chest after eating certain stuff    HPI  Symptoms started about 1 week.  She is having soreness to her chest area.  Worse when she eats certain foods.  Nasal dryness. She had chocolate milk.  She had meatloaf, deviled eggs.    Denies trouble with swallowing.  She is not taking any medication with reflux. The ENT informed her she had GERD.      Past Medical History:  Diagnosis Date  . Anemia   . Arthritis    Rheumatoid Arthritis 2010  . Asthma   . Eczema   . Renal disorder   . Strep throat      Family History  Adopted: Yes  Problem Relation Age of Onset  . Cancer Mother      Current Outpatient Medications:  .  albuterol (PROVENTIL HFA;VENTOLIN HFA) 108 (90 BASE) MCG/ACT inhaler, Inhale 2 puffs into the lungs every 6 (six) hours as needed for shortness of breath. For shortness of breath, Disp: , Rfl:  .  Ferrous Sulfate (IRON PO), Take by mouth., Disp: , Rfl:  .  hydrOXYzine (ATARAX/VISTARIL) 25 MG tablet, Take 25 mg by mouth as needed., Disp: , Rfl: 3 .  lisinopril (ZESTRIL) 10 MG tablet, Take 1 tablet (10 mg total) by mouth daily., Disp: 90 tablet, Rfl: 0   Allergies  Allergen Reactions  . Shellfish Allergy Anaphylaxis     Review of Systems  Constitutional: Negative.   Respiratory: Negative.   Cardiovascular: Negative.  Negative for chest pain, palpitations and leg swelling.  Genitourinary:       Epigastric discomfort.  Musculoskeletal: Negative.   Neurological: Negative for dizziness and headaches.     Today's Vitals   04/16/19 1510  BP: 118/76  Pulse: 87  Temp: 98.3 F (36.8 C)  TempSrc: Oral  Weight: 143 lb 6.4 oz (65 kg)  Height: 5' 5.5" (1.664 m)   Body mass index is 23.5 kg/m.   Objective:  Physical Exam Vitals signs reviewed.  Constitutional:      Appearance: Normal appearance.   Cardiovascular:     Rate and Rhythm: Normal rate and regular rhythm.  Neurological:     Mental Status: She is alert.         Assessment And Plan:     1. Gastroesophageal reflux disease without esophagitis  Advised to avoid fried and greasy foods, spicy foods  Can take over the counter omeprazole as needed  If not better return to office.   Minette Brine, FNP    THE PATIENT IS ENCOURAGED TO PRACTICE SOCIAL DISTANCING DUE TO THE COVID-19 PANDEMIC.

## 2019-04-18 ENCOUNTER — Other Ambulatory Visit: Payer: Self-pay | Admitting: Internal Medicine

## 2019-05-03 ENCOUNTER — Other Ambulatory Visit: Payer: Self-pay | Admitting: Internal Medicine

## 2019-07-23 ENCOUNTER — Telehealth: Payer: Self-pay

## 2019-07-23 NOTE — Telephone Encounter (Signed)
I returned the pt's call to schedule her an appt about eye issues.

## 2019-07-24 ENCOUNTER — Other Ambulatory Visit: Payer: Self-pay

## 2019-07-24 ENCOUNTER — Ambulatory Visit: Payer: BC Managed Care – PPO | Admitting: Nurse Practitioner

## 2019-07-24 ENCOUNTER — Encounter: Payer: Self-pay | Admitting: Nurse Practitioner

## 2019-07-24 VITALS — BP 126/80 | HR 84 | Temp 98.8°F | Ht 65.8 in | Wt 145.6 lb

## 2019-07-24 DIAGNOSIS — W268XXA Contact with other sharp object(s), not elsewhere classified, initial encounter: Secondary | ICD-10-CM

## 2019-07-24 DIAGNOSIS — S0592XA Unspecified injury of left eye and orbit, initial encounter: Secondary | ICD-10-CM | POA: Diagnosis not present

## 2019-07-24 MED ORDER — POLYMYXIN B-TRIMETHOPRIM 10000-0.1 UNIT/ML-% OP SOLN: 2.0000 [drp] | OPHTHALMIC | 0 refills | Status: DC

## 2019-07-24 NOTE — Progress Notes (Signed)
Subjective:     Patient ID: Aimee Austin , female    DOB: 23-Apr-1961 , 58 y.o.   MRN: [id number]   Chief Complaint  Patient presents with  . eye concerns    patient stated she bent down and poked her eye with a branch on the aloe vera plant. her eye has been hurting and drainaging. patient denies swelling    HPI  She was bending down to plug something in to the socket and an aloe vera plant poked her in the eye.  This happened on Monday night about 730pm, was sore and painful. She cleansed out with water.  Feels like there is something in her eye under the top lid.  She had some drainage coming from her eye.  Initially had blurred vision but is improving.    Past Medical History:  Diagnosis Date  . Anemia   . Arthritis    Rheumatoid Arthritis 2010  . Asthma   . Eczema   . Renal disorder   . Strep throat      Family History  Adopted: Yes  Problem Relation Age of Onset  . Cancer Mother      Current Outpatient Medications:  .  albuterol (VENTOLIN HFA) 108 (90 Base) MCG/ACT inhaler, Inhale 2 puffs into the lungs every 6 (six) hours as needed for shortness of breath. For shortness of breath, Disp: 18 g, Rfl: 3 .  Ferrous Sulfate (IRON PO), Take by mouth., Disp: , Rfl:  .  Fluocinonide Emulsified Base 0.05 % CREA, APPLY  CREAM EXTERNALLY TO AFFECTED AREA TWICE DAILY AS NEEDED, Disp: 30 g, Rfl: 0 .  hydrOXYzine (ATARAX/VISTARIL) 25 MG tablet, Take 1 tablet (25 mg total) by mouth as needed., Disp: 30 tablet, Rfl: 3 .  lisinopril (ZESTRIL) 10 MG tablet, Take 1 tablet by mouth once daily, Disp: 90 tablet, Rfl: 0   Allergies  Allergen Reactions  . Shellfish Allergy Anaphylaxis     Review of Systems  Constitutional: Negative.   Eyes: Positive for pain, redness and visual disturbance (slight blurred vision). Negative for photophobia and itching.  Respiratory: Negative.   Cardiovascular: Negative.   Neurological: Negative for dizziness and headaches.  Psychiatric/Behavioral:  Negative.      Today's Vitals   07/24/19 1420  BP: 126/80  Pulse: 84  Temp: 98.8 F (37.1 C)  TempSrc: Oral  Weight: 145 lb 9.6 oz (66 kg)  Height: 5' 5.8" (1.671 m)  PainSc: 3   PainLoc: Eye   Body mass index is 23.64 kg/m.   Objective:  Physical Exam Constitutional:      Appearance: Normal appearance.  Eyes:     General: Lids are normal. Lids are everted, no foreign bodies appreciated. Vision grossly intact. Gaze aligned appropriately.        Right eye: No foreign body or discharge.        Left eye: No foreign body or discharge.     Comments: She has discomfort when looking medially  Cardiovascular:     Rate and Rhythm: Normal rate and regular rhythm.     Pulses: Normal pulses.     Heart sounds: No murmur.  Pulmonary:     Effort: Pulmonary effort is normal. No respiratory distress.     Breath sounds: Normal breath sounds.  Skin:    Capillary Refill: Capillary refill takes less than 2 seconds.  Neurological:     General: No focal deficit present.     Mental Status: She is alert and oriented to person,  place, and time.  Psychiatric:        Mood and Affect: Mood normal.        Behavior: Behavior normal.        Thought Content: Thought content normal.        Judgment: Judgment normal.         Assessment And Plan:     1. Left eye injury, initial encounter  No obvious abrasion noted however due to the discomfort and intermittent eye drainage will treat with antibiotic drops.  If pain or vision loss advised her to call office or seek medical attention at opthalmology - trimethoprim-polymyxin b (POLYTRIM) ophthalmic solution; Place 2 drops into the left eye every 4 (four) hours.  Dispense: 10 mL; Refill: 0   Minette Brine, FNP    THE PATIENT IS ENCOURAGED TO PRACTICE SOCIAL DISTANCING DUE TO THE COVID-19 PANDEMIC.

## 2019-08-14 ENCOUNTER — Other Ambulatory Visit: Payer: BC Managed Care – PPO

## 2019-08-14 ENCOUNTER — Encounter: Payer: Self-pay | Admitting: Nurse Practitioner

## 2019-08-14 ENCOUNTER — Other Ambulatory Visit: Payer: Self-pay

## 2019-08-14 ENCOUNTER — Ambulatory Visit: Payer: BC Managed Care – PPO | Admitting: Nurse Practitioner

## 2019-08-14 ENCOUNTER — Encounter: Payer: Self-pay | Admitting: Internal Medicine

## 2019-08-14 VITALS — BP 138/82 | HR 87 | Temp 97.5°F | Ht 65.4 in | Wt 143.6 lb

## 2019-08-14 DIAGNOSIS — R1084 Generalized abdominal pain: Secondary | ICD-10-CM

## 2019-08-14 DIAGNOSIS — G4700 Insomnia, unspecified: Secondary | ICD-10-CM

## 2019-08-14 DIAGNOSIS — L659 Nonscarring hair loss, unspecified: Secondary | ICD-10-CM

## 2019-08-14 LAB — POCT URINALYSIS DIPSTICK
Bilirubin, UA: NEGATIVE
Glucose, UA: NEGATIVE
Ketones, UA: NEGATIVE
Nitrite, UA: NEGATIVE
Protein, UA: POSITIVE — AB
Spec Grav, UA: 1.01 (ref 1.010–1.025)
Urobilinogen, UA: 0.2 E.U./dL
pH, UA: 5.5 (ref 5.0–8.0)

## 2019-08-14 MED ORDER — MAGNESIUM 250 MG PO TABS: ORAL_TABLET | ORAL | 2 refills | Status: DC

## 2019-08-14 NOTE — Progress Notes (Signed)
This visit occurred during the SARS-CoV-2 public health emergency.  Safety protocols were in place, including screening questions prior to the visit, additional usage of staff PPE, and extensive cleaning of exam room while observing appropriate contact time as indicated for disinfecting solutions.  Subjective:     Patient ID: Aimee Austin , female    DOB: 13-Sep-1960 , 58 y.o.   MRN: [id number]   Chief Complaint  Patient presents with  . Abdominal Pain    patient stated her stomach started hurting at the end of nov. she stated she feels like her stomach is really tight and she stated it felt like something busted inside her stomach.    HPI  She is under increased stress since end November.  She is forgetting things more.  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  She feels her stomach is tight.  She is having hair loss to include eye lashes, insomnia and numbness to left arm.  She is doing live classes as well. She is staying up late to do the work.  She will also eat late.  She has tried to drink tea before bed.      Abdominal Pain This is a new problem. Pertinent negatives include no anorexia, diarrhea, fever, frequency, headaches or vomiting. She has tried nothing for the symptoms. There is no history of abdominal surgery.     Past Medical History:  Diagnosis Date  . Anemia   . Arthritis    Rheumatoid Arthritis 2010  . Asthma   . Eczema   . Renal disorder   . Strep throat      Family History  Adopted: Yes  Problem Relation Age of Onset  . Cancer Mother      Current Outpatient Medications:  .  albuterol (VENTOLIN HFA) 108 (90 Base) MCG/ACT inhaler, Inhale 2 puffs into the lungs every 6 (six) hours as needed for shortness of breath. For shortness of breath, Disp: 18 g, Rfl: 3 .  Ferrous Sulfate (IRON PO), Take by mouth., Disp: , Rfl:  .  Fluocinonide Emulsified Base 0.05 % CREA, APPLY  CREAM EXTERNALLY TO AFFECTED AREA  TWICE DAILY AS NEEDED, Disp: 30 g, Rfl: 0 .  hydrOXYzine (ATARAX/VISTARIL) 25 MG tablet, Take 1 tablet (25 mg total) by mouth as needed., Disp: 30 tablet, Rfl: 3 .  lisinopril (ZESTRIL) 10 MG tablet, Take 1 tablet by mouth once daily, Disp: 90 tablet, Rfl: 0   Allergies  Allergen Reactions  . Shellfish Allergy Anaphylaxis     Review of Systems  Constitutional: Positive for fatigue. Negative for fever.  Respiratory: Negative.   Cardiovascular: Negative.  Negative for chest pain, palpitations and leg swelling.  Gastrointestinal: Positive for abdominal pain. Negative for anorexia, diarrhea and vomiting.  Genitourinary: Negative for frequency.  Neurological: Positive for numbness (left arm). Negative for dizziness and headaches.  Psychiatric/Behavioral:       Insomnia      Today's Vitals   08/14/19 1603  BP: 138/82  Pulse: 87  Temp: (!) 97.5 F (36.4 C)  TempSrc: Oral  Weight: 143 lb 9.6 oz (65.1 kg)  Height: 5' 5.4" (1.661 m)  PainSc: 6   PainLoc: Abdomen   Body mass index is 23.6 kg/m.   Objective:  Physical Exam Constitutional:      Appearance: She is well-developed.  Abdominal:     Tenderness: There is generalized abdominal tenderness. There is no right CVA tenderness or left  CVA tenderness. Negative signs include Murphy's sign and McBurney's sign.  Skin:    General: Skin is warm and dry.     Capillary Refill: Capillary refill takes less than 2 seconds.  Neurological:     Mental Status: She is alert.         Assessment And Plan:     1. Generalized abdominal pain  Will check abdominal ultrasound  She has mild tenderness to generalized abdomen area  Encouraged to take probiotic daily - US Abdomen Complete; Future - H Pylori, IGM, IGG, IGA AB - POCT Urinalysis Dipstick (81002) - Culture, Urine  2. Insomnia, unspecified type  This is worse with the current stressors with teaching virtually  She is to take magnesium 250 mg with evening meal  May be  related to stress vs metabolic - Magnesium 886 MG TABS; Take one tablet by mouth with evening meal daily  Dispense: 30 tablet; Refill: 2 - TSH  3. Hair loss  Hair is shedding more recently, will check for metabolic causes vs stress.   - CBC with Differential/Platelet - BMP8+eGFR - TSH   Minette Brine, FNP    THE PATIENT IS ENCOURAGED TO PRACTICE SOCIAL DISTANCING DUE TO THE COVID-19 PANDEMIC.

## 2019-08-15 ENCOUNTER — Other Ambulatory Visit: Payer: Self-pay | Admitting: Internal Medicine

## 2019-08-15 DIAGNOSIS — Z1231 Encounter for screening mammogram for malignant neoplasm of breast: Secondary | ICD-10-CM

## 2019-08-15 LAB — URINE CULTURE

## 2019-08-16 ENCOUNTER — Other Ambulatory Visit: Payer: Self-pay

## 2019-08-16 LAB — CBC WITH DIFFERENTIAL/PLATELET
Basophils Absolute: 0.1 10*3/uL (ref 0.0–0.2)
Basos: 1 %
EOS (ABSOLUTE): 0.1 10*3/uL (ref 0.0–0.4)
Eos: 1 %
Hematocrit: 32.4 % — ABNORMAL LOW (ref 34.0–46.6)
Hemoglobin: 10.9 g/dL — ABNORMAL LOW (ref 11.1–15.9)
Immature Grans (Abs): 0 10*3/uL (ref 0.0–0.1)
Immature Granulocytes: 0 %
Lymphocytes Absolute: 2.6 10*3/uL (ref 0.7–3.1)
Lymphs: 32 %
MCH: 25.7 pg — ABNORMAL LOW (ref 26.6–33.0)
MCHC: 33.6 g/dL (ref 31.5–35.7)
MCV: 76 fL — ABNORMAL LOW (ref 79–97)
Monocytes Absolute: 0.5 10*3/uL (ref 0.1–0.9)
Monocytes: 6 %
Neutrophils Absolute: 4.9 10*3/uL (ref 1.4–7.0)
Neutrophils: 60 %
Platelets: 219 10*3/uL (ref 150–450)
RBC: 4.24 x10E6/uL (ref 3.77–5.28)
RDW: 14.5 % (ref 11.7–15.4)
WBC: 8.2 10*3/uL (ref 3.4–10.8)

## 2019-08-16 LAB — BMP8+EGFR
BUN/Creatinine Ratio: 13 (ref 9–23)
BUN: 17 mg/dL (ref 6–24)
CO2: 24 mmol/L (ref 20–29)
Calcium: 9.9 mg/dL (ref 8.7–10.2)
Chloride: 102 mmol/L (ref 96–106)
Creatinine, Ser: 1.36 mg/dL — ABNORMAL HIGH (ref 0.57–1.00)
GFR calc Af Amer: 50 mL/min/{1.73_m2} — ABNORMAL LOW (ref >59)
GFR calc non Af Amer: 44 mL/min/{1.73_m2} — ABNORMAL LOW (ref >59)
Glucose: 97 mg/dL (ref 65–99)
Potassium: 4.8 mmol/L (ref 3.5–5.2)
Sodium: 141 mmol/L (ref 134–144)

## 2019-08-16 LAB — H PYLORI, IGM, IGG, IGA AB
H pylori, IgM Abs: <9 units (ref 0.0–8.9)
H. pylori, IgA Abs: <9 units (ref 0.0–8.9)
H. pylori, IgG AbS: 0.15 Index Value (ref 0.00–0.79)

## 2019-08-16 LAB — TSH: TSH: 0.91 u[IU]/mL (ref 0.450–4.500)

## 2019-08-16 MED ORDER — LISINOPRIL 10 MG PO TABS: 10.0000 mg | ORAL_TABLET | Freq: Every day | ORAL | 1 refills | Status: DC

## 2019-08-19 ENCOUNTER — Encounter: Payer: Self-pay | Admitting: Nurse Practitioner

## 2019-08-19 ENCOUNTER — Ambulatory Visit: Payer: BC Managed Care – PPO | Admitting: Nurse Practitioner

## 2019-08-19 ENCOUNTER — Other Ambulatory Visit: Payer: Self-pay

## 2019-08-19 VITALS — BP 132/70 | HR 98 | Temp 98.7°F | Ht 65.8 in | Wt 142.4 lb

## 2019-08-19 DIAGNOSIS — R0609 Other forms of dyspnea: Secondary | ICD-10-CM

## 2019-08-19 DIAGNOSIS — R0600 Dyspnea, unspecified: Secondary | ICD-10-CM | POA: Diagnosis not present

## 2019-08-19 DIAGNOSIS — D582 Other hemoglobinopathies: Secondary | ICD-10-CM

## 2019-08-19 DIAGNOSIS — Z0000 Encounter for general adult medical examination without abnormal findings: Secondary | ICD-10-CM

## 2019-08-19 LAB — POCT UA - MICROALBUMIN
Albumin/Creatinine Ratio, Urine, POC: >300
Creatinine, POC: 100 mg/dL
Microalbumin Ur, POC: 150 mg/L

## 2019-08-19 MED ORDER — ALBUTEROL SULFATE HFA 108 (90 BASE) MCG/ACT IN AERS: 2.0000 | INHALATION_SPRAY | Freq: Four times a day (QID) | RESPIRATORY_TRACT | 3 refills | Status: DC | PRN

## 2019-08-19 NOTE — Progress Notes (Addendum)
This visit occurred during the SARS-CoV-2 public health emergency.  Safety protocols were in place, including screening questions prior to the visit, additional usage of staff PPE, and extensive cleaning of exam room while observing appropriate contact time as indicated for disinfecting solutions.  Subjective:     Patient ID: Aimee Austin , female    DOB: [birth year] , 58 y.o.   MRN: [id number]   Chief Complaint  Patient presents with  . Annual Exam    HPI  Here for HM  She has an appt with Urology in January 2021.    She sees Dr. Belva Bertin in [place], [place] on [address].  [phone] - she will see them on Friday.  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     The patient she status post hysterectomy for birth control.  Mammogram last done 08/10/2017.  Negative for: breast discharge, breast lump(s), breast pain and breast self exam.  Pertinent negatives include abnormal bleeding (hematology), anxiety, decreased libido, depression, difficulty falling sleep, dyspareunia, history of infertility, nocturia, sexual dysfunction, sleep disturbances, urinary incontinence, urinary urgency, vaginal discharge and vaginal itching. Diet regular.The patient states her exercise level is minimal (2 days a week with stretches).  When she takes out the trash, going up stairs or any other strenuous activity.  [facility] [place] [facility] and [employer].      The patient's tobacco use is:  Social History   Tobacco Use  Smoking Status Never Smoker  Smokeless Tobacco Never Used   She has been exposed to passive smoke. The patient's alcohol use is:  Social History   Substance and Sexual Activity  Alcohol Use No  . Alcohol/week: 0.0 standard drinks   Additional information: Last pap done in 2016, had hysterectomy.   Past Medical History:  Diagnosis Date  . Anemia   . Arthritis    Rheumatoid Arthritis 2010  . Asthma   . Eczema   . Renal disorder   . Strep throat       Family History  Adopted: Yes  Problem Relation Age of Onset  . Cancer Mother      Current Outpatient Medications:  .  albuterol (VENTOLIN HFA) 108 (90 Base) MCG/ACT inhaler, Inhale 2 puffs into the lungs every 6 (six) hours as needed for shortness of breath. For shortness of breath, Disp: 18 g, Rfl: 3 .  Ferrous Sulfate (IRON PO), Take by mouth., Disp: , Rfl:  .  Fluocinonide Emulsified Base 0.05 % CREA, APPLY  CREAM EXTERNALLY TO AFFECTED AREA TWICE DAILY AS NEEDED, Disp: 30 g, Rfl: 0 .  hydrOXYzine (ATARAX/VISTARIL) 25 MG tablet, Take 1 tablet (25 mg total) by mouth as needed., Disp: 30 tablet, Rfl: 3 .  lisinopril (ZESTRIL) 10 MG tablet, Take 1 tablet (10 mg total) by mouth daily., Disp: 90 tablet, Rfl: 1 .  Magnesium 250 MG TABS, Take one tablet by mouth with evening meal daily, Disp: 30 tablet, Rfl: 2   Allergies  Allergen Reactions  . Shellfish Allergy Anaphylaxis     Review of Systems  Constitutional: Negative.   HENT: Negative.   Eyes: Negative.   Respiratory: Negative.   Cardiovascular: Negative.   Gastrointestinal: Negative.   Endocrine: Negative.   Genitourinary: Negative.   Musculoskeletal: Negative.   Skin: Negative.   Allergic/Immunologic: Negative.   Neurological: Negative.   Hematological: Negative.   Psychiatric/Behavioral: Negative.      Today's Vitals   08/19/19 1418  BP: 132/70  Pulse:  98  Temp: 98.7 F (37.1 C)  TempSrc: Oral  Weight: 142 lb 6.4 oz (64.6 kg)  Height: 5' 5.8" (1.671 m)  PainSc: 3   PainLoc: Abdomen   Body mass index is 23.12 kg/m.   Objective:  Physical Exam Constitutional:      Appearance: Normal appearance. She is well-developed.  HENT:     Head: Normocephalic and atraumatic.     Right Ear: Hearing, tympanic membrane, ear canal and external ear normal. There is no impacted cerumen.     Left Ear: Hearing, tympanic membrane, ear canal and external ear normal. There is no impacted cerumen.     Nose: Nose normal.      Mouth/Throat:     Mouth: Mucous membranes are moist.  Eyes:     General: Lids are normal.     Conjunctiva/sclera: Conjunctivae normal.     Pupils: Pupils are equal, round, and reactive to light.     Funduscopic exam:    Right eye: No papilledema.        Left eye: No papilledema.  Neck:     Thyroid: No thyroid mass.     Vascular: No carotid bruit.  Cardiovascular:     Rate and Rhythm: Normal rate and regular rhythm.     Pulses: Normal pulses.     Heart sounds: Normal heart sounds. No murmur.  Pulmonary:     Effort: Pulmonary effort is normal.     Breath sounds: Normal breath sounds.  Chest:     Breasts:        Right: Normal. No mass or tenderness.        Left: Normal. No mass or tenderness.  Abdominal:     General: Abdomen is flat. Bowel sounds are normal.     Palpations: Abdomen is soft.  Musculoskeletal:        General: No swelling. Normal range of motion.     Cervical back: Full passive range of motion without pain, normal range of motion and neck supple.     Right lower leg: No edema.     Left lower leg: No edema.  Skin:    General: Skin is warm and dry.     Capillary Refill: Capillary refill takes less than 2 seconds.  Neurological:     General: No focal deficit present.     Mental Status: She is alert and oriented to person, place, and time.     Cranial Nerves: No cranial nerve deficit.     Sensory: No sensory deficit.  Psychiatric:        Mood and Affect: Mood normal.        Behavior: Behavior normal.        Thought Content: Thought content normal.        Judgment: Judgment normal.         Assessment And Plan:     1. Health maintenance examination . Behavior modifications discussed and diet history reviewed.   . Pt will continue to exercise regularly and modify diet with low GI, plant based foods and decrease intake of processed foods.  . Recommend intake of daily multivitamin, Vitamin D, and calcium.  . Recommend mammogram (scheduled for Jan 2021)for  preventive screenings, as well as recommend immunizations that include influenza, TDAP . Breast exam done no abnormal findings  2. Hemoglobin C trait (HCC)  Chronic, stable - POCT UA - Microalbumin - EKG 12-Lead  3. Dyspnea on exertion  SpO2 is 98 % on Room air  This is likely  related to deconditioning since she [place] not [facility] [employer] or around the park since March when the Pandemic hit.   EKG revealed sinus rhythm HR 90   Minette Brine, FNP    THE PATIENT IS ENCOURAGED TO PRACTICE SOCIAL DISTANCING DUE TO THE COVID-19 PANDEMIC.

## 2019-08-19 NOTE — Patient Instructions (Signed)
Health Maintenance  Topic Date Due  . MAMMOGRAM  08/11/2019  . PAP SMEAR-Modifier  08/19/2019 (Originally 10/20/2018)  . INFLUENZA VACCINE  12/04/2019 (Originally 04/06/2019)  . HIV Screening  08/18/2020 (Originally 09/09/1977)  . COLONOSCOPY  06/28/2025  . TETANUS/TDAP  05/11/2026  . Hepatitis C Screening  Completed   Health Maintenance, Female Adopting a healthy lifestyle and getting preventive care are important in promoting health and wellness. Ask your health care provider about:  The right schedule for you to have regular tests and exams.  Things you can do on your own to prevent diseases and keep yourself healthy. What should I know about diet, weight, and exercise? Eat a healthy diet   Eat a diet that includes plenty of vegetables, fruits, low-fat dairy products, and lean protein.  Do not eat a lot of foods that are high in solid fats, added sugars, or sodium. Maintain a healthy weight Body mass index (BMI) is used to identify weight problems. It estimates body fat based on height and weight. Your health care provider can help determine your BMI and help you achieve or maintain a healthy weight. Get regular exercise Get regular exercise. This is one of the most important things you can do for your health. Most adults should:  Exercise for at least 150 minutes each week. The exercise should increase your heart rate and make you sweat (moderate-intensity exercise).  Do strengthening exercises at least twice a week. This is in addition to the moderate-intensity exercise.  Spend less time sitting. Even light physical activity can be beneficial. Watch cholesterol and blood lipids Have your blood tested for lipids and cholesterol at 56 years of age, then have this test every 5 years. Have your cholesterol levels checked more often if:  Your lipid or cholesterol levels are high.  You are older than 56 years of age.  You are at high risk for heart disease. What should I know  about cancer screening? Depending on your health history and family history, you may need to have cancer screening at various ages. This may include screening for:  Breast cancer.  Cervical cancer.  Colorectal cancer.  Skin cancer.  Lung cancer. What should I know about heart disease, diabetes, and high blood pressure? Blood pressure and heart disease  High blood pressure causes heart disease and increases the risk of stroke. This is more likely to develop in people who have high blood pressure readings, are of African descent, or are overweight.  Have your blood pressure checked: ? Every 3-5 years if you are 34-13 years of age. ? Every year if you are 2 years old or older. Diabetes Have regular diabetes screenings. This checks your fasting blood sugar level. Have the screening done:  Once every three years after age 61 if you are at a normal weight and have a low risk for diabetes.  More often and at a younger age if you are overweight or have a high risk for diabetes. What should I know about preventing infection? Hepatitis B If you have a higher risk for hepatitis B, you should be screened for this virus. Talk with your health care provider to find out if you are at risk for hepatitis B infection. Hepatitis C Testing is recommended for:  Everyone born from [birth year] through 1963.  Anyone with known risk factors for hepatitis C. Sexually transmitted infections (STIs)  Get screened for STIs, including gonorrhea and chlamydia, if: ? You are sexually active and are younger than 56 years of  age. ? You are older than 56 years of age and your health care provider tells you that you are at risk for this type of infection. ? Your sexual activity has changed since you were last screened, and you are at increased risk for chlamydia or gonorrhea. Ask your health care provider if you are at risk.  Ask your health care provider about whether you are at high risk for HIV. Your health care  provider may recommend a prescription medicine to help prevent HIV infection. If you choose to take medicine to prevent HIV, you should first get tested for HIV. You should then be tested every 3 months for as long as you are taking the medicine. Pregnancy  If you are about to stop having your period (premenopausal) and you may become pregnant, seek counseling before you get pregnant.  Take 400 to 800 micrograms (mcg) of folic acid every day if you become pregnant.  Ask for birth control (contraception) if you want to prevent pregnancy. Osteoporosis and menopause Osteoporosis is a disease in which the bones lose minerals and strength with aging. This can result in bone fractures. If you are 70 years old or older, or if you are at risk for osteoporosis and fractures, ask your health care provider if you should:  Be screened for bone loss.  Take a calcium or vitamin D supplement to lower your risk of fractures.  Be given hormone replacement therapy (HRT) to treat symptoms of menopause. Follow these instructions at home: Lifestyle  Do not use any products that contain nicotine or tobacco, such as cigarettes, e-cigarettes, and chewing tobacco. If you need help quitting, ask your health care provider.  Do not use street drugs.  Do not share needles.  Ask your health care provider for help if you need support or information about quitting drugs. Alcohol use  Do not drink alcohol if: ? Your health care provider tells you not to drink. ? You are pregnant, may be pregnant, or are planning to become pregnant.  If you drink alcohol: ? Limit how much you use to 0-1 drink a day. ? Limit intake if you are breastfeeding.  Be aware of how much alcohol is in your drink. In the [place] one drink equals one 12 oz bottle of beer (355 mL), one 5 oz glass of wine (148 mL), or one 1 oz glass of hard liquor (44 mL). General instructions  Schedule regular health, dental, and eye exams.  Stay current  with your vaccines.  Tell your health care provider if: ? You often feel depressed. ? You have ever been abused or do not feel safe at home. Summary  Adopting a healthy lifestyle and getting preventive care are important in promoting health and wellness.  Follow your health care provider's instructions about healthy diet, exercising, and getting tested or screened for diseases.  Follow your health care provider's instructions on monitoring your cholesterol and blood pressure. This information is not intended to replace advice given to you by your health care provider. Make sure you discuss any questions you have with your health care provider. Document Released: 03/07/2011 Document Revised: 08/15/2018 Document Reviewed: 08/15/2018 Elsevier Patient Education  2020 [employer].

## 2019-08-21 LAB — IRON AND TIBC
Iron Saturation: 12 % — ABNORMAL LOW (ref 15–55)
Iron: 38 ug/dL (ref 27–159)
Total Iron Binding Capacity: 305 ug/dL (ref 250–450)
UIBC: 267 ug/dL (ref 131–425)

## 2019-08-21 LAB — LIPID PANEL W/O CHOL/HDL RATIO
Cholesterol, Total: 255 mg/dL — ABNORMAL HIGH (ref 100–199)
HDL: 46 mg/dL (ref >39)
LDL Chol Calc (NIH): 190 mg/dL — ABNORMAL HIGH (ref 0–99)
Triglycerides: 107 mg/dL (ref 0–149)
VLDL Cholesterol Cal: 19 mg/dL (ref 5–40)

## 2019-08-21 LAB — SPECIMEN STATUS REPORT

## 2019-08-30 IMAGING — MG DIGITAL DIAGNOSTIC UNILATERAL RIGHT MAMMOGRAM WITH TOMO AND CAD
4 series · 4 of 12 positions shown · non-contrast
Comparison: Previous exam(s).

CLINICAL DATA: Patient reports an episode of firmness/tightness in
the upper right breast that subsequently moved to her left side and,
more recently, has resolved.Symptoms associated with Alia Tiger of
bronchitis. No discrete reported breast lump.

EXAM:
DIGITAL DIAGNOSTIC UNILATERAL RIGHT MAMMOGRAM WITH CAD AND TOMO

[R CC synth-2D]
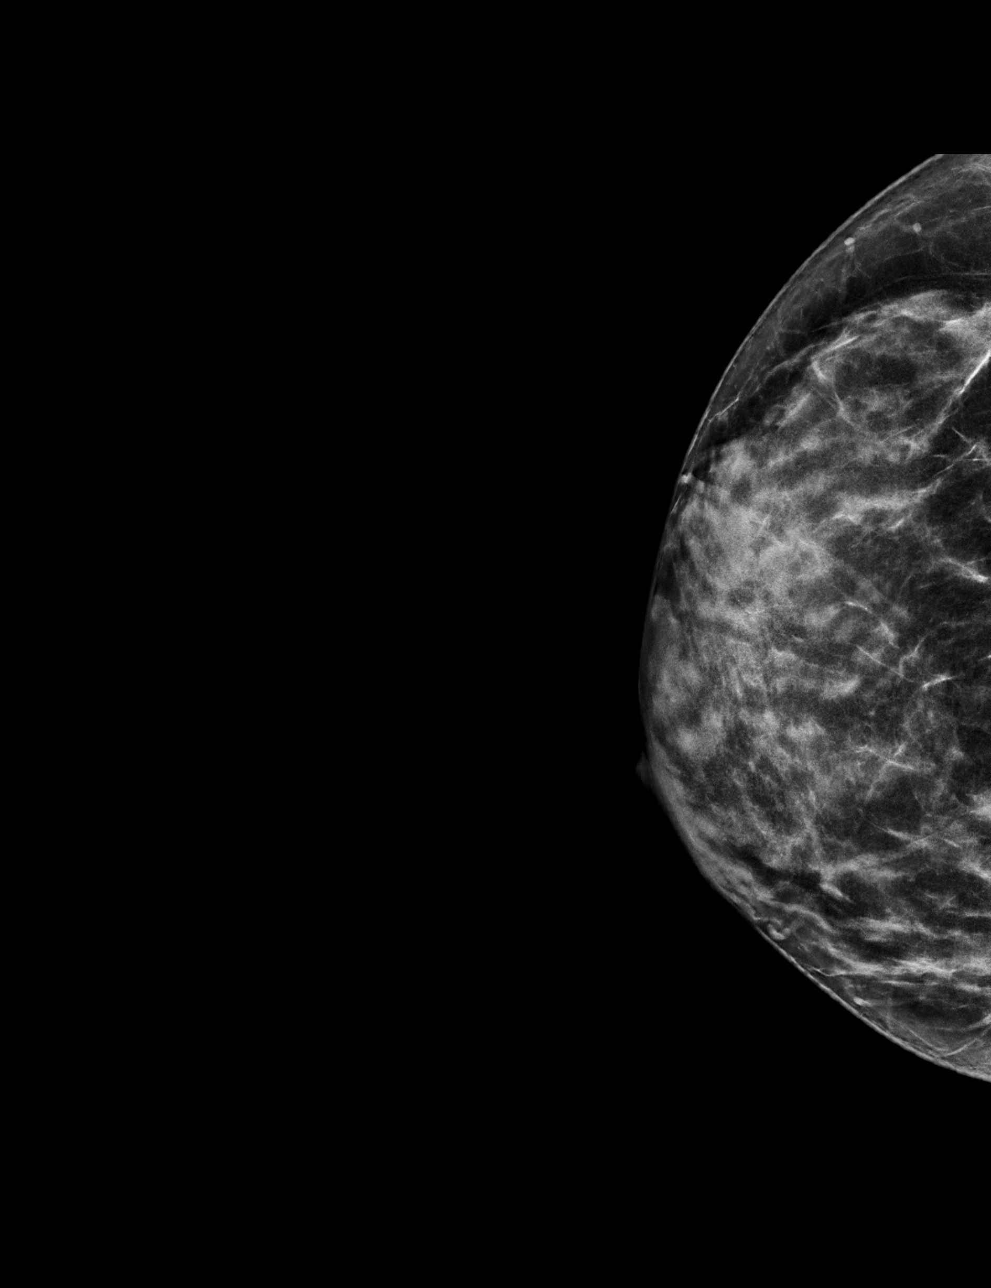

[R MLO synth-2D]
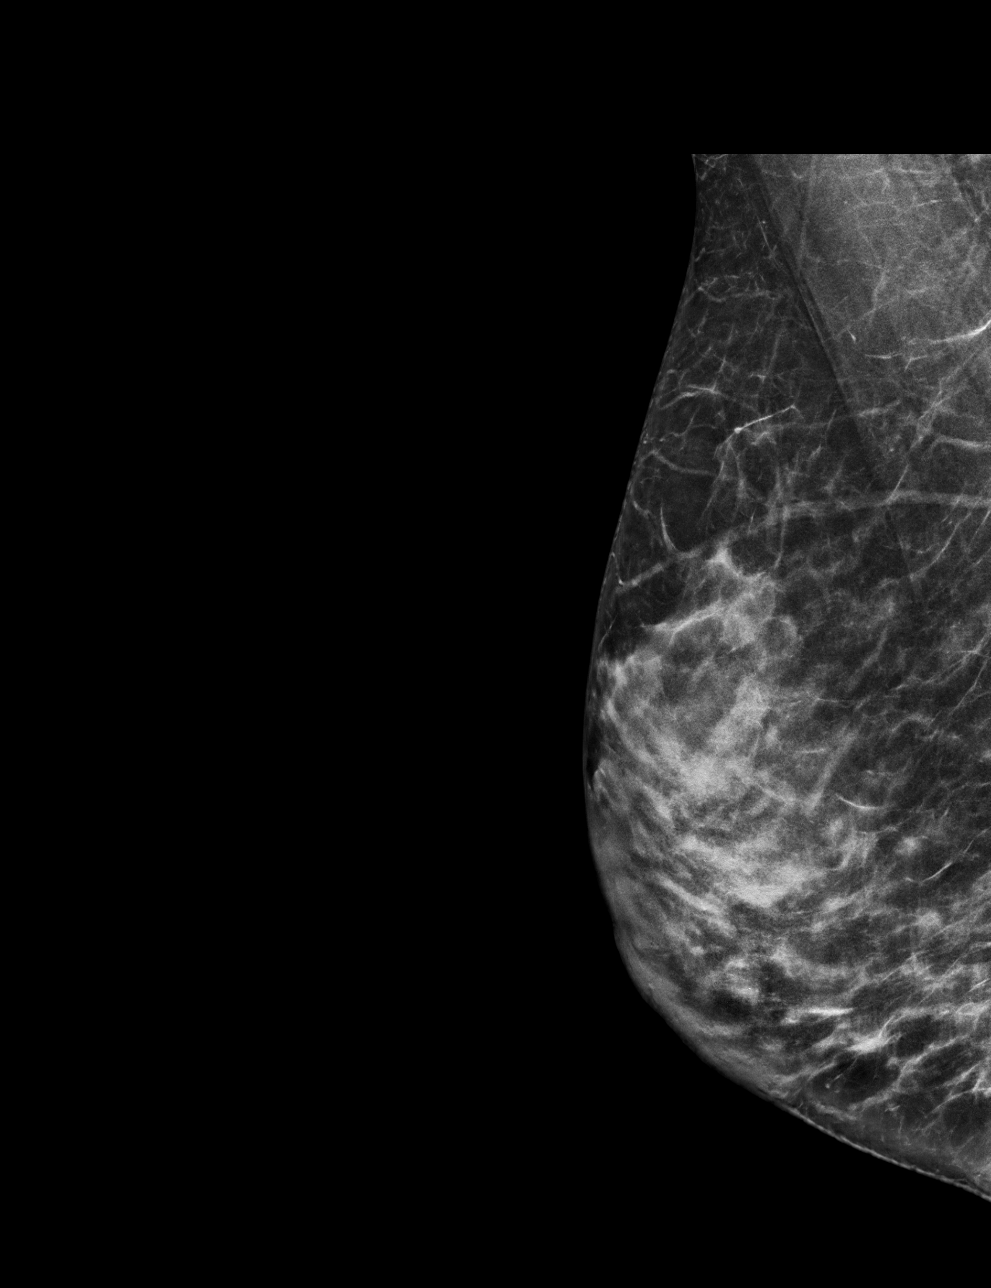

[R MLO tomo · tomo slice 32/63.0]
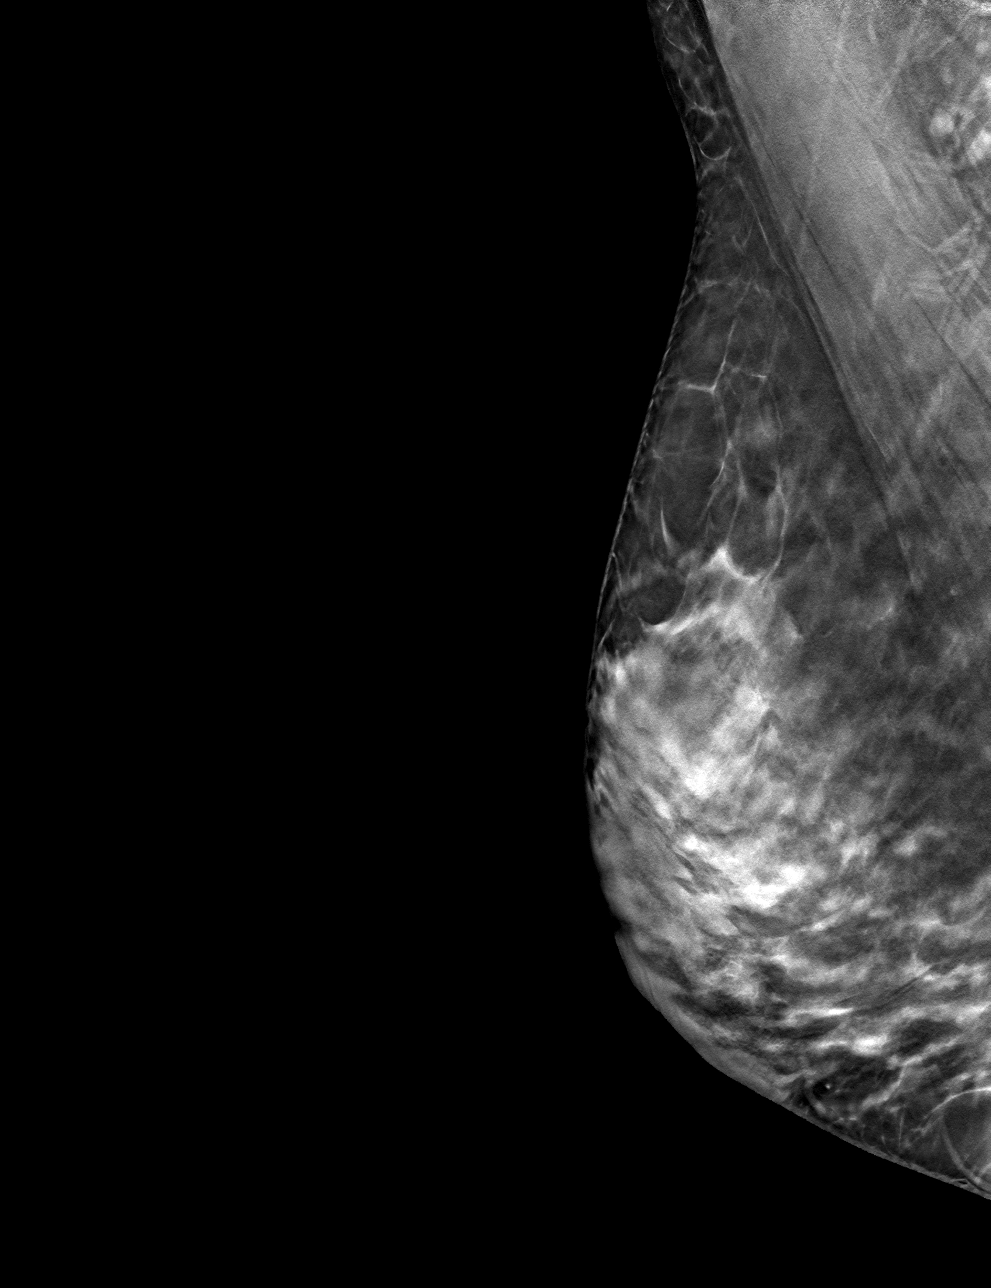

[R CC tomo · tomo slice 28/55.0]
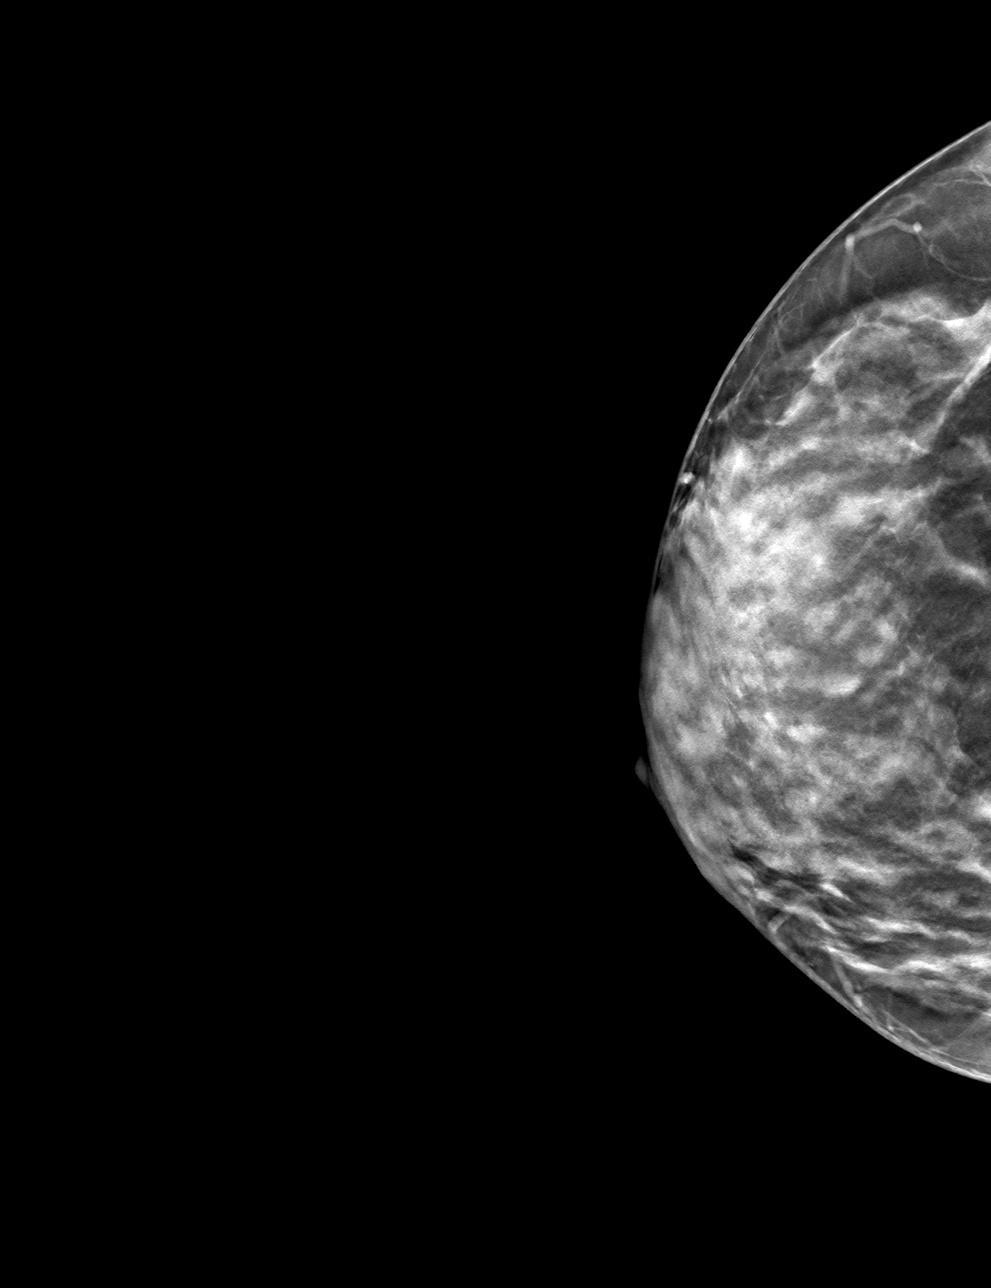

[4 of 12 positions shown; findings below may reference images not displayed]

ACR Breast Density Category c: The breast tissue is heterogeneously
dense, which may obscure small masses.
FINDINGS: There are no discrete masses, areas of architectural distortion,
areas of significant asymmetry or suspicious calcifications. No
mammographic change.

Mammographic images were processed with CAD.
IMPRESSION: Negative exam.  No evidence of breast malignancy.

RECOMMENDATION:
Screening mammogram in August 2018.(Code:[id number])

I have discussed the findings and recommendations with the patient.
Results were also provided in writing at the conclusion of the
visit. If applicable, a reminder letter will be sent to the patient
regarding the next appointment.

BI-RADS CATEGORY  1: Negative.

## 2019-08-31 NOTE — Progress Notes (Signed)
We can try the cutting back and have her to follow up in 3 months.

## 2019-09-02 ENCOUNTER — Ambulatory Visit
Admission: RE | Admit: 2019-09-02 | Discharge: 2019-09-02 | Disposition: A | Discharge status: Home / Self Care | Payer: BC Managed Care – PPO | Source: Ambulatory Visit | Attending: Nurse Practitioner | Admitting: Nurse Practitioner

## 2019-09-02 DIAGNOSIS — R1084 Generalized abdominal pain: Secondary | ICD-10-CM

## 2019-09-25 ENCOUNTER — Telehealth: Payer: Self-pay

## 2019-09-25 NOTE — Telephone Encounter (Signed)
I left the pt a message that I was returning her call to schedule her an appt for evaluation of the body pain that she is having on her left side of her body.

## 2019-09-25 NOTE — Telephone Encounter (Signed)
The pt was scheduled an appt for evaluation of left side pain.  The pt was told if her symptoms get worse to go be evaluated at the urgent care.

## 2019-09-30 ENCOUNTER — Ambulatory Visit: Payer: BC Managed Care – PPO | Admitting: Nurse Practitioner

## 2019-09-30 ENCOUNTER — Encounter: Payer: Self-pay | Admitting: Nurse Practitioner

## 2019-09-30 ENCOUNTER — Other Ambulatory Visit: Payer: Self-pay

## 2019-09-30 VITALS — BP 112/74 | HR 92 | Temp 98.7°F | Ht 65.6 in | Wt 142.6 lb

## 2019-09-30 DIAGNOSIS — M79662 Pain in left lower leg: Secondary | ICD-10-CM

## 2019-09-30 NOTE — Patient Instructions (Addendum)
Baker Cyst ° °A Baker cyst, also called a popliteal cyst, is a growth that forms at the back of the knee. The cyst forms when the fluid-filled sac (bursa) that cushions the knee joint becomes enlarged. °What are the causes? °In most cases, a Baker cyst results from another knee problem that causes swelling inside the knee. This makes the fluid inside the knee joint (synovial fluid) flow into the bursa behind the knee, causing the bursa to enlarge. °What increases the risk? °You may be more likely to develop a Baker cyst if you already have a knee problem, such as: °· A tear in cartilage that cushions the knee joint (meniscal tear). °· A tear in the tissues that connect the bones of the knee joint (ligament tear). °· Knee swelling from osteoarthritis, rheumatoid arthritis, or gout. °What are the signs or symptoms? °The main symptom of this condition is a lump behind the knee. This may be the only symptom of the condition. The lump may be painful, especially when the knee is straightened. If the lump is painful, the pain may come and go. The knee may also be stiff. °Symptoms may quickly get more severe if the cyst breaks open (ruptures). If the cyst ruptures, you may feel the following in your knee and calf: °· Sudden or worsening pain. °· Swelling. °· Bruising. °· Redness in the calf. °A Baker cyst does not always cause symptoms. °How is this diagnosed? °This condition may be diagnosed based on your symptoms and medical history. Your health care provider will also do a physical exam. This may include: °· Feeling the cyst to check whether it is tender. °· Checking your knee for signs of another knee condition that causes swelling. °You may have imaging tests, such as: °· X-rays. °· MRI. °· Ultrasound. °How is this treated? °A Baker cyst that is not painful may go away without treatment. If the cyst gets large or painful, it will likely get better if the underlying knee problem is treated. °If needed, treatment for a  Baker cyst may include: °· Resting. °· Keeping weight off of the knee. This means not leaning on the knee to support your body weight. °· Taking NSAIDs, such as ibuprofen, to reduce pain and swelling. °· Having a procedure to drain the fluid from the cyst with a needle (aspiration). You may also get an injection of a medicine that reduces swelling (steroid). °· Having surgery. This may be needed if other treatments do not work. This usually involves correcting knee damage and removing the cyst. °Follow these instructions at home: ° °Activity °· Rest as told by your health care provider. °· Avoid activities that make pain or swelling worse. °· Return to your normal activities as told by your health care provider. Ask your health care provider what activities are safe for you. °· Do not use the injured limb to support your body weight until your health care provider says that you can. Use crutches as told by your health care provider. °General instructions °· Take over-the-counter and prescription medicines only as told by your health care provider. °· Keep all follow-up visits as told by your health care provider. This is important. °Contact a health care provider if: °· You have knee pain, stiffness, or swelling that does not get better. °Get help right away if: °· You have sudden or worsening pain and swelling in your calf area. °Summary °· A Baker cyst, also called a popliteal cyst, is a growth that forms at the   back of the knee.  In most cases, a Baker cyst results from another knee problem that causes swelling inside the knee.  A Baker cyst that is not painful may go away without treatment.  If needed, treatment for a Baker cyst may include resting, keeping weight off of the knee, medicines, or draining fluid from the cyst.  Surgery may be needed if other treatments are not effective. This information is not intended to replace advice given to you by your health care provider. Make sure you discuss any  questions you have with your health care provider. Document Revised: 01/04/2019 Document Reviewed: 01/04/2019 Elsevier Patient Education  [place].  Take aspirin 81mg  daily.

## 2019-09-30 NOTE — Progress Notes (Signed)
This visit occurred during the SARS-CoV-2 public health emergency.  Safety protocols were in place, including screening questions prior to the visit, additional usage of staff PPE, and extensive cleaning of exam room while observing appropriate contact time as indicated for disinfecting solutions.  Subjective:     Patient ID: Aimee Austin , female    DOB: 09-Dec-1960 , 59 y.o.   MRN: [id number]   Chief Complaint  Patient presents with  . Leg Pain    patient's left leg has been hurting since last sunday. she stated the pain is moving down her leg    HPI  Started in left foot radiating up left leg.  Feels like a cramp, she took Motrin to Tuesday and the pain has not stopped.  Denies shortness of breath.  She is sitting all day with work.  She is propping her leg up.  Sometimes feels like it is getting numb.  Feels like it is twisting  Leg Pain  The incident occurred 5 to 7 days ago (last Sunday). There was no injury mechanism. The pain is present in the left leg. The quality of the pain is described as cramping. The pain is at a severity of 6/10. The pain has been constant since onset. Pertinent negatives include no inability to bear weight, loss of sensation, numbness or tingling. Nothing aggravates the symptoms. She has tried NSAIDs for the symptoms. The treatment provided no relief.     Past Medical History:  Diagnosis Date  . Anemia   . Arthritis    Rheumatoid Arthritis 2010  . Asthma   . Eczema   . Renal disorder   . Strep throat      Family History  Adopted: Yes  Problem Relation Age of Onset  . Cancer Mother      Current Outpatient Medications:  .  albuterol (VENTOLIN HFA) 108 (90 Base) MCG/ACT inhaler, Inhale 2 puffs into the lungs every 6 (six) hours as needed for shortness of breath. For shortness of breath, Disp: 18 g, Rfl: 3 .  Ferrous Sulfate (IRON PO), Take by mouth., Disp: , Rfl:  .  Fluocinonide Emulsified Base 0.05 % CREA, APPLY  CREAM EXTERNALLY TO AFFECTED  AREA TWICE DAILY AS NEEDED, Disp: 30 g, Rfl: 0 .  hydrOXYzine (ATARAX/VISTARIL) 25 MG tablet, Take 1 tablet (25 mg total) by mouth as needed., Disp: 30 tablet, Rfl: 3 .  lisinopril (ZESTRIL) 10 MG tablet, Take 1 tablet (10 mg total) by mouth daily., Disp: 90 tablet, Rfl: 1 .  Magnesium 250 MG TABS, Take one tablet by mouth with evening meal daily, Disp: 30 tablet, Rfl: 2   Allergies  Allergen Reactions  . Shellfish Allergy Anaphylaxis     Review of Systems  Constitutional: Negative.   Respiratory: Negative.   Cardiovascular: Negative.  Negative for chest pain, palpitations and leg swelling.  Neurological: Negative for dizziness, tingling and numbness.  Psychiatric/Behavioral: Negative.      Today's Vitals   09/30/19 1545  BP: 112/74  Pulse: 92  Temp: 98.7 F (37.1 C)  TempSrc: Oral  Weight: 142 lb 9.6 oz (64.7 kg)  Height: 5' 5.6" (1.666 m)  PainSc: 6   PainLoc: Leg   Body mass index is 23.3 kg/m.   Objective:  Physical Exam Constitutional:      Appearance: Normal appearance.  Neurological:     Mental Status: She is alert.         Assessment And Plan:     1. Pain in left lower  leg  DVT vs Bakers cyst  Patient refuses to take socks off to assess coloration or temperature   Take baby aspirin - VAS [place] LOWER EXTREMITY VENOUS (DVT); Future - D-dimer, quantitative (not at [facility])   Minette Brine, FNP    THE PATIENT IS ENCOURAGED TO PRACTICE SOCIAL DISTANCING DUE TO THE COVID-19 PANDEMIC.

## 2019-10-01 LAB — D-DIMER, QUANTITATIVE: D-DIMER: 0.23 mg/L FEU (ref 0.00–0.49)

## 2019-10-02 ENCOUNTER — Ambulatory Visit
Admission: RE | Admit: 2019-10-02 | Discharge: 2019-10-02 | Disposition: A | Discharge status: Home / Self Care | Payer: BC Managed Care – PPO | Source: Ambulatory Visit | Attending: Internal Medicine | Admitting: Internal Medicine

## 2019-10-02 ENCOUNTER — Other Ambulatory Visit: Payer: Self-pay

## 2019-10-02 DIAGNOSIS — Z1231 Encounter for screening mammogram for malignant neoplasm of breast: Secondary | ICD-10-CM

## 2019-10-10 ENCOUNTER — Ambulatory Visit (HOSPITAL_COMMUNITY)
Admission: RE | Admit: 2019-10-10 | Discharge: 2019-10-10 | Disposition: A | Discharge status: Home / Self Care | Payer: BC Managed Care – PPO | Source: Ambulatory Visit | Attending: Nurse Practitioner | Admitting: Nurse Practitioner

## 2019-10-10 ENCOUNTER — Other Ambulatory Visit: Payer: Self-pay

## 2019-10-10 DIAGNOSIS — M79662 Pain in left lower leg: Secondary | ICD-10-CM | POA: Diagnosis not present

## 2019-10-10 NOTE — Progress Notes (Signed)
Okay thank you, see if we can fax this result to him

## 2019-10-28 ENCOUNTER — Telehealth: Payer: Self-pay

## 2019-10-28 NOTE — Telephone Encounter (Signed)
I called patient because [facility] sent over a letter stating she missed her appointment she stated they scheduled her an appointment without consulting her and that she made them aware she was unable to come in on that day but she will give them a call to schedule an appt. YRL,RMA

## 2019-11-12 ENCOUNTER — Other Ambulatory Visit: Payer: Self-pay

## 2019-11-12 MED ORDER — LISINOPRIL 10 MG PO TABS: 10.0000 mg | ORAL_TABLET | Freq: Every day | ORAL | 1 refills | Status: DC

## 2019-11-15 ENCOUNTER — Other Ambulatory Visit: Payer: Self-pay | Admitting: Nephrology

## 2019-11-15 DIAGNOSIS — R3121 Asymptomatic microscopic hematuria: Secondary | ICD-10-CM

## 2019-11-15 DIAGNOSIS — N281 Cyst of kidney, acquired: Secondary | ICD-10-CM

## 2019-11-18 ENCOUNTER — Encounter: Payer: Self-pay | Admitting: Nurse Practitioner

## 2019-11-18 ENCOUNTER — Other Ambulatory Visit: Payer: Self-pay

## 2019-11-18 ENCOUNTER — Ambulatory Visit: Payer: BC Managed Care – PPO | Admitting: Nurse Practitioner

## 2019-11-18 VITALS — BP 112/76 | HR 80 | Temp 97.9°F | Ht 65.8 in | Wt 140.4 lb

## 2019-11-18 DIAGNOSIS — E7800 Pure hypercholesterolemia, unspecified: Secondary | ICD-10-CM | POA: Diagnosis not present

## 2019-11-18 NOTE — Progress Notes (Signed)
This visit occurred during the SARS-CoV-2 public health emergency.  Safety protocols were in place, including screening questions prior to the visit, additional usage of staff PPE, and extensive cleaning of exam room while observing appropriate contact time as indicated for disinfecting solutions.  Subjective:     Patient ID: Aimee Austin , female    DOB: 07/29/1961 , 59 y.o.   MRN: [id number]   Chief Complaint  Patient presents with  . Hyperlipidemia    HPI  She states "she is doing better with her eating, not eating as many sweets and fried foods". She is walking on the treadmill.  She is finding creative ways to move more.  Wt Readings from Last 3 Encounters: 11/18/19 : 140 lb 6.4 oz (63.7 kg) 09/30/19 : 142 lb 9.6 oz (64.7 kg) 08/19/19 : 142 lb 6.4 oz (64.6 kg)  She is scheduled to have a CT scan of her kidneys on Mar 26th. After that is done she may get the Covid vaccine.  Hyperlipidemia This is a chronic problem. The current episode started more than 1 year ago. The problem is uncontrolled. She has no history of chronic renal disease. There are no known factors aggravating her hyperlipidemia. Pertinent negatives include no chest pain. There are no compliance problems.  Risk factors for coronary artery disease include dyslipidemia.      Past Medical History:  Diagnosis Date  . Anemia   . Arthritis    Rheumatoid Arthritis 2010  . Asthma   . Eczema   . Renal disorder   . Strep throat      Family History  Adopted: Yes  Problem Relation Age of Onset  . Cancer Mother      Current Outpatient Medications:  .  albuterol (VENTOLIN HFA) 108 (90 Base) MCG/ACT inhaler, Inhale 2 puffs into the lungs every 6 (six) hours as needed for shortness of breath. For shortness of breath, Disp: 18 g, Rfl: 3 .  Ferrous Sulfate (IRON PO), Take by mouth., Disp: , Rfl:  .  Fluocinonide Emulsified Base 0.05 % CREA, APPLY  CREAM EXTERNALLY TO AFFECTED AREA TWICE DAILY AS NEEDED, Disp: 30 g,  Rfl: 0 .  hydrOXYzine (ATARAX/VISTARIL) 25 MG tablet, Take 1 tablet (25 mg total) by mouth as needed., Disp: 30 tablet, Rfl: 3 .  lisinopril (ZESTRIL) 10 MG tablet, Take 1 tablet (10 mg total) by mouth daily., Disp: 90 tablet, Rfl: 1 .  Magnesium 250 MG TABS, Take one tablet by mouth with evening meal daily (Patient not taking: Reported on 11/18/2019), Disp: 30 tablet, Rfl: 2   Allergies  Allergen Reactions  . Shellfish Allergy Anaphylaxis     Review of Systems  Constitutional: Negative.  Negative for fatigue.  Respiratory: Negative.   Cardiovascular: Negative.  Negative for chest pain, palpitations and leg swelling.  Gastrointestinal: Negative.   Endocrine: Negative.  Negative for polydipsia, polyphagia and polyuria.  Genitourinary: Negative.   Neurological: Negative for dizziness and headaches.  Psychiatric/Behavioral: Negative.      Today's Vitals   11/18/19 1449  BP: 112/76  Pulse: 80  Temp: 97.9 F (36.6 C)  TempSrc: Oral  Weight: 140 lb 6.4 oz (63.7 kg)  Height: 5' 5.8" (1.671 m)  PainSc: 0-No pain   Body mass index is 22.8 kg/m.   Objective:  Physical Exam Constitutional:      General: She is not in acute distress.    Appearance: Normal appearance. She is well-developed.  HENT:     Head: Normocephalic and atraumatic.  Right Ear: Hearing normal.     Left Ear: Hearing normal.  Eyes:     General: Lids are normal.     Funduscopic exam:    Right eye: No papilledema.        Left eye: No papilledema.  Neck:     Thyroid: No thyroid mass.     Vascular: No carotid bruit.  Cardiovascular:     Rate and Rhythm: Normal rate and regular rhythm.     Pulses: Normal pulses.     Heart sounds: Normal heart sounds. No murmur.  Pulmonary:     Effort: Pulmonary effort is normal. No respiratory distress.     Breath sounds: Normal breath sounds.  Chest:     Breasts:        Right: Normal. No mass or tenderness.        Left: Normal. No mass or tenderness.   Musculoskeletal:     Cervical back: Full passive range of motion without pain.  Neurological:     General: No focal deficit present.     Mental Status: She is alert and oriented to person, place, and time.     Cranial Nerves: No cranial nerve deficit.     Sensory: No sensory deficit.  Psychiatric:        Mood and Affect: Mood normal.        Behavior: Behavior normal.        Thought Content: Thought content normal.        Judgment: Judgment normal.         Assessment And Plan:     1. 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 - Lipid panel      Minette Brine, FNP    THE PATIENT IS ENCOURAGED TO PRACTICE SOCIAL DISTANCING DUE TO THE COVID-19 PANDEMIC.

## 2019-11-18 NOTE — Patient Instructions (Signed)
COVID-19 Vaccine Information can be found at: [email or web address] For questions related to vaccine distribution or appointments, please email [email or web address] or call [phone].    

## 2019-11-19 LAB — LIPID PANEL
Chol/HDL Ratio: 5.7 ratio — ABNORMAL HIGH (ref 0.0–4.4)
Cholesterol, Total: 246 mg/dL — ABNORMAL HIGH (ref 100–199)
HDL: 43 mg/dL (ref >39)
LDL Chol Calc (NIH): 178 mg/dL — ABNORMAL HIGH (ref 0–99)
Triglycerides: 138 mg/dL (ref 0–149)
VLDL Cholesterol Cal: 25 mg/dL (ref 5–40)

## 2019-11-25 ENCOUNTER — Ambulatory Visit: Payer: Self-pay | Admitting: Nurse Practitioner

## 2019-12-02 ENCOUNTER — Ambulatory Visit
Admission: RE | Admit: 2019-12-02 | Discharge: 2019-12-02 | Disposition: A | Discharge status: Home / Self Care | Payer: BC Managed Care – PPO | Source: Ambulatory Visit | Attending: Nephrology | Admitting: Nephrology

## 2019-12-02 DIAGNOSIS — N281 Cyst of kidney, acquired: Secondary | ICD-10-CM

## 2019-12-02 DIAGNOSIS — R3121 Asymptomatic microscopic hematuria: Secondary | ICD-10-CM

## 2019-12-02 MED ORDER — IOPAMIDOL (ISOVUE-300) INJECTION 61%
100.0000 mL | Freq: Once | INTRAVENOUS | Status: AC | PRN
  Administered 2019-12-02: 100 mL via INTRAVENOUS

## 2019-12-31 ENCOUNTER — Other Ambulatory Visit: Payer: Self-pay

## 2019-12-31 ENCOUNTER — Encounter: Payer: Self-pay | Admitting: Nurse Practitioner

## 2019-12-31 ENCOUNTER — Ambulatory Visit: Payer: BC Managed Care – PPO | Admitting: Nurse Practitioner

## 2019-12-31 VITALS — BP 116/76 | HR 95 | Temp 97.7°F | Ht 65.8 in | Wt 145.2 lb

## 2019-12-31 DIAGNOSIS — R3129 Other microscopic hematuria: Secondary | ICD-10-CM | POA: Diagnosis not present

## 2019-12-31 DIAGNOSIS — N281 Cyst of kidney, acquired: Secondary | ICD-10-CM | POA: Diagnosis not present

## 2019-12-31 DIAGNOSIS — R109 Unspecified abdominal pain: Secondary | ICD-10-CM

## 2019-12-31 DIAGNOSIS — R82998 Other abnormal findings in urine: Secondary | ICD-10-CM

## 2019-12-31 LAB — POCT URINALYSIS DIPSTICK
Bilirubin, UA: NEGATIVE
Glucose, UA: NEGATIVE
Ketones, UA: NEGATIVE
Nitrite, UA: NEGATIVE
Protein, UA: POSITIVE — AB
Spec Grav, UA: 1.02 (ref 1.010–1.025)
Urobilinogen, UA: 0.2 E.U./dL
pH, UA: 5.5 (ref 5.0–8.0)

## 2019-12-31 MED ORDER — CEFTRIAXONE SODIUM 1 G IJ SOLR
1.0000 g | Freq: Once | INTRAMUSCULAR | Status: AC
  Administered 2019-12-31: 1 g via INTRAMUSCULAR

## 2019-12-31 MED ORDER — CIPROFLOXACIN HCL 500 MG PO TABS: 500.0000 mg | ORAL_TABLET | Freq: Two times a day (BID) | ORAL | 0 refills | Status: AC

## 2019-12-31 NOTE — Progress Notes (Signed)
This visit occurred during the SARS-CoV-2 public health emergency.  Safety protocols were in place, including screening questions prior to the visit, additional usage of staff PPE, and extensive cleaning of exam room while observing appropriate contact time as indicated for disinfecting solutions.  Subjective:     Patient ID: Aimee Austin , female    DOB: [birth year] , 59 y.o.   MRN: [id number]   Chief Complaint  Patient presents with  . Flank Pain    patient stated she has been having some flank pain and strong odor to her urine for the past week. she stated when she wipes she has some spotting    HPI  Strong odor to her urine. This has been ongoing for one week. She is seeing a nephrologist - CT scan of her kidneys shows cysts  Flank Pain Pertinent negatives include no chest pain or headaches. (She has vaginal spotting. )     Past Medical History:  Diagnosis Date  . Anemia   . Arthritis    Rheumatoid Arthritis 2010  . Asthma   . Eczema   . Renal disorder   . Strep throat      Family History  Adopted: Yes  Problem Relation Age of Onset  . Cancer Mother      Current Outpatient Medications:  .  albuterol (VENTOLIN HFA) 108 (90 Base) MCG/ACT inhaler, Inhale 2 puffs into the lungs every 6 (six) hours as needed for shortness of breath. For shortness of breath, Disp: 18 g, Rfl: 3 .  Ferrous Sulfate (IRON PO), Take by mouth., Disp: , Rfl:  .  Fluocinonide Emulsified Base 0.05 % CREA, APPLY  CREAM EXTERNALLY TO AFFECTED AREA TWICE DAILY AS NEEDED, Disp: 30 g, Rfl: 0 .  hydrOXYzine (ATARAX/VISTARIL) 25 MG tablet, Take 1 tablet (25 mg total) by mouth as needed., Disp: 30 tablet, Rfl: 3 .  lisinopril (ZESTRIL) 10 MG tablet, Take 1 tablet (10 mg total) by mouth daily., Disp: 90 tablet, Rfl: 1   Allergies  Allergen Reactions  . Shellfish Allergy Anaphylaxis     Review of Systems  Constitutional: Negative for fatigue.  Respiratory: Negative.   Cardiovascular: Negative.   Negative for chest pain, palpitations and leg swelling.  Gastrointestinal: Negative for constipation, diarrhea, nausea and vomiting.  Genitourinary: Positive for flank pain and frequency.  Neurological: Negative for dizziness and headaches.  Psychiatric/Behavioral: Negative.      Today's Vitals   12/31/19 1537  BP: 116/76  Pulse: 95  Temp: 97.7 F (36.5 C)  TempSrc: Oral  Weight: 145 lb 3.2 oz (65.9 kg)  Height: 5' 5.8" (1.671 m)  PainSc: 5    Body mass index is 23.58 kg/m.   Objective:  Physical Exam Constitutional:      General: She is not in acute distress.    Appearance: Normal appearance.  Cardiovascular:     Rate and Rhythm: Normal rate and regular rhythm.     Pulses: Normal pulses.     Heart sounds: Normal heart sounds. No murmur.  Pulmonary:     Effort: Pulmonary effort is normal. No respiratory distress.     Breath sounds: Normal breath sounds.  Abdominal:     General: Abdomen is flat. Bowel sounds are normal. There is no distension.     Palpations: Abdomen is soft. There is no mass.     Tenderness: There is abdominal tenderness (mild tenderness to lower abdomen). There is right CVA tenderness. There is no left CVA tenderness.  Skin:  Capillary Refill: Capillary refill takes less than 2 seconds.  Neurological:     General: No focal deficit present.     Mental Status: She is alert and oriented to person, place, and time.  Psychiatric:        Mood and Affect: Mood normal.        Behavior: Behavior normal.        Thought Content: Thought content normal.        Judgment: Judgment normal.         Assessment And Plan:     1. Flank pain  Positive CVA tenderness to right flank area - POCT Urinalysis Dipstick [id number]) - Ambulatory referral to Urology - cefTRIAXone (ROCEPHIN) injection 1 g - Culture, Urine - ciprofloxacin (CIPRO) 500 MG tablet; Take 1 tablet (500 mg total) by mouth 2 (two) times daily for 7 days.  Dispense: 10 tablet; Refill: 0  2.  Kidney cysts  She has seen the Nephrologist and he recommends she be seen by a urologist to monitor the cyst she has on her kidneys  I have requested she ask the provider to  - Ambulatory referral to Urology - ciprofloxacin (CIPRO) 500 MG tablet; Take 1 tablet (500 mg total) by mouth 2 (two) times daily for 7 days.  Dispense: 10 tablet; Refill: 0  3. Other microscopic hematuria  Moderate blood in urine  Will send for culture   If persists may need to refer to gyn for further evaluation - Ambulatory referral to Urology - cefTRIAXone (ROCEPHIN) injection 1 g - Culture, Urine - ciprofloxacin (CIPRO) 500 MG tablet; Take 1 tablet (500 mg total) by mouth 2 (two) times daily for 7 days.  Dispense: 10 tablet; Refill: 0  4. Urine white blood cells increased  Will treat for urinary tract infection since has symptoms and send urine for culture. - Culture, Urine - ciprofloxacin (CIPRO) 500 MG tablet; Take 1 tablet (500 mg total) by mouth 2 (two) times daily for 7 days.  Dispense: 10 tablet; Refill: 0   Minette Brine, FNP    THE PATIENT IS ENCOURAGED TO PRACTICE SOCIAL DISTANCING DUE TO THE COVID-19 PANDEMIC.

## 2019-12-31 NOTE — Patient Instructions (Signed)
Urinary Tract Infection, Adult A urinary tract infection (UTI) is an infection of any part of the urinary tract. The urinary tract includes:  The kidneys.  The ureters.  The bladder.  The urethra. These organs make, store, and get rid of pee (urine) in the body. What are the causes? This is caused by germs (bacteria) in your genital area. These germs grow and cause swelling (inflammation) of your urinary tract. What increases the risk? You are more likely to develop this condition if:  You have a small, thin tube (catheter) to drain pee.  You cannot control when you pee or poop (incontinence).  You are female, and: ? You use these methods to prevent pregnancy:  A medicine that kills sperm (spermicide).  A device that blocks sperm (diaphragm). ? You have low levels of a female hormone (estrogen). ? You are pregnant.  You have genes that add to your risk.  You are sexually active.  You take antibiotic medicines.  You have trouble peeing because of: ? A prostate that is bigger than normal, if you are female. ? A blockage in the part of your body that drains pee from the bladder (urethra). ? A kidney stone. ? A nerve condition that affects your bladder (neurogenic bladder). ? Not getting enough to drink. ? Not peeing often enough.  You have other conditions, such as: ? Diabetes. ? A weak disease-fighting system (immune system). ? Sickle cell disease. ? Gout. ? Injury of the spine. What are the signs or symptoms? Symptoms of this condition include:  Needing to pee right away (urgently).  Peeing often.  Peeing small amounts often.  Pain or burning when peeing.  Blood in the pee.  Pee that smells bad or not like normal.  Trouble peeing.  Pee that is cloudy.  Fluid coming from the vagina, if you are female.  Pain in the belly or lower back. Other symptoms include:  Throwing up (vomiting).  No urge to eat.  Feeling mixed up (confused).  Being tired  and grouchy (irritable).  A fever.  Watery poop (diarrhea). How is this treated? This condition may be treated with:  Antibiotic medicine.  Other medicines.  Drinking enough water. Follow these instructions at home:  Medicines  Take over-the-counter and prescription medicines only as told by your doctor.  If you were prescribed an antibiotic medicine, take it as told by your doctor. Do not stop taking it even if you start to feel better. General instructions  Make sure you: ? Pee until your bladder is empty. ? Do not hold pee for a long time. ? Empty your bladder after sex. ? Wipe from front to back after pooping if you are a female. Use each tissue one time when you wipe.  Drink enough fluid to keep your pee pale yellow.  Keep all follow-up visits as told by your doctor. This is important. Contact a doctor if:  You do not get better after 1-2 days.  Your symptoms go away and then come back. Get help right away if:  You have very bad back pain.  You have very bad pain in your lower belly.  You have a fever.  You are sick to your stomach (nauseous).  You are throwing up. Summary  A urinary tract infection (UTI) is an infection of any part of the urinary tract.  This condition is caused by germs in your genital area.  There are many risk factors for a UTI. These include having a small, thin   tube to drain pee and not being able to control when you pee or poop.  Treatment includes antibiotic medicines for germs.  Drink enough fluid to keep your pee pale yellow. This information is not intended to replace advice given to you by your health care provider. Make sure you discuss any questions you have with your health care provider. Document Revised: 08/09/2018 Document Reviewed: 03/01/2018 Elsevier Patient Education  2020 [employer]  

## 2020-01-02 LAB — URINE CULTURE

## 2020-02-17 ENCOUNTER — Ambulatory Visit: Payer: BC Managed Care – PPO | Admitting: Nurse Practitioner

## 2020-02-18 ENCOUNTER — Other Ambulatory Visit: Payer: Self-pay

## 2020-02-18 DIAGNOSIS — R801 Persistent proteinuria, unspecified: Secondary | ICD-10-CM

## 2020-02-20 ENCOUNTER — Other Ambulatory Visit: Payer: BC Managed Care – PPO

## 2020-02-20 ENCOUNTER — Other Ambulatory Visit: Payer: Self-pay

## 2020-02-20 DIAGNOSIS — R801 Persistent proteinuria, unspecified: Secondary | ICD-10-CM

## 2020-02-21 LAB — RENAL FUNCTION PANEL
Albumin: 4.4 g/dL (ref 3.8–4.9)
BUN/Creatinine Ratio: 14 (ref 9–23)
BUN: 16 mg/dL (ref 6–24)
CO2: 21 mmol/L (ref 20–29)
Calcium: 8.9 mg/dL (ref 8.7–10.2)
Chloride: 96 mmol/L (ref 96–106)
Creatinine, Ser: 1.11 mg/dL — ABNORMAL HIGH (ref 0.57–1.00)
GFR calc Af Amer: 64 mL/min/{1.73_m2} (ref >59)
GFR calc non Af Amer: 55 mL/min/{1.73_m2} — ABNORMAL LOW (ref >59)
Glucose: 93 mg/dL (ref 65–99)
Phosphorus: 3.1 mg/dL (ref 3.0–4.3)
Potassium: 3.9 mmol/L (ref 3.5–5.2)
Sodium: 135 mmol/L (ref 134–144)

## 2020-02-21 LAB — URINALYSIS, COMPLETE
Bilirubin, UA: NEGATIVE
Glucose, UA: NEGATIVE
Ketones, UA: NEGATIVE
Leukocytes,UA: NEGATIVE
Nitrite, UA: NEGATIVE
Specific Gravity, UA: 1.016 (ref 1.005–1.030)
Urobilinogen, Ur: 0.2 mg/dL (ref 0.2–1.0)
pH, UA: 5.5 (ref 5.0–7.5)

## 2020-02-21 LAB — MICROSCOPIC EXAMINATION
Bacteria, UA: NONE SEEN
Casts: NONE SEEN /lpf
WBC, UA: NONE SEEN /hpf (ref 0–5)

## 2020-02-21 LAB — HEMOGLOBIN: Hemoglobin: 10.3 g/dL — ABNORMAL LOW (ref 11.1–15.9)

## 2020-02-21 LAB — PROTEIN / CREATININE RATIO, URINE
Creatinine, Urine: 93.4 mg/dL
Protein, Ur: 18.4 mg/dL
Protein/Creat Ratio: 197 mg/g creat (ref 0–200)

## 2020-02-24 ENCOUNTER — Ambulatory Visit: Payer: BC Managed Care – PPO | Admitting: Nurse Practitioner

## 2020-02-24 ENCOUNTER — Other Ambulatory Visit: Payer: Self-pay

## 2020-02-24 ENCOUNTER — Encounter: Payer: Self-pay | Admitting: Nurse Practitioner

## 2020-02-24 VITALS — BP 110/60 | HR 96 | Temp 97.8°F | Ht 66.6 in | Wt 141.0 lb

## 2020-02-24 DIAGNOSIS — Z8709 Personal history of other diseases of the respiratory system: Secondary | ICD-10-CM | POA: Diagnosis not present

## 2020-02-24 DIAGNOSIS — I10 Essential (primary) hypertension: Secondary | ICD-10-CM

## 2020-02-24 DIAGNOSIS — Z872 Personal history of diseases of the skin and subcutaneous tissue: Secondary | ICD-10-CM

## 2020-02-24 DIAGNOSIS — R801 Persistent proteinuria, unspecified: Secondary | ICD-10-CM | POA: Diagnosis not present

## 2020-02-24 MED ORDER — LISINOPRIL 10 MG PO TABS: 10.0000 mg | ORAL_TABLET | Freq: Every day | ORAL | 1 refills | Status: DC

## 2020-02-24 MED ORDER — ALBUTEROL SULFATE HFA 108 (90 BASE) MCG/ACT IN AERS: 2.0000 | INHALATION_SPRAY | Freq: Four times a day (QID) | RESPIRATORY_TRACT | 3 refills | Status: DC | PRN

## 2020-02-24 MED ORDER — FLUOCINONIDE EMULSIFIED BASE 0.05 % EX CREA: 1.0000 "application " | TOPICAL_CREAM | Freq: Three times a day (TID) | CUTANEOUS | 1 refills | Status: DC

## 2020-02-24 NOTE — Progress Notes (Addendum)
This visit occurred during the SARS-CoV-2 public health emergency.  Safety protocols were in place, including screening questions prior to the visit, additional usage of staff PPE, and extensive cleaning of exam room while observing appropriate contact time as indicated for disinfecting solutions.  Subjective:     Patient ID: Aimee Austin , female    DOB: 09-19-1960 , 60 y.o.   MRN: [id number]   Chief Complaint  Patient presents with   Hypertension    HPI  She presents today for her 4 month HTN follow up. Her blood pressure is well controlled. She is doing well and is not working from home. She is exercising at home on the treadmill but not outdoors. She is working an outside job called [employer] [place] the [employer] and is outdoors. She is doing well with her medications and has no sideffects currently. She would like a refill in her ventolin and flucinonide. She is doing well with her diet and eats [employer] in moderation as a treat. She has been constipated and is eating greens and vegetables.   She is doing better with the bakers cysts and her flank pain is resolved. The urologist did see two cyst and she has an appointment with them next month. Her nephrologist is aware.  Hyperlipidemia This is a chronic problem. The current episode started more than 1 year ago. The problem is uncontrolled. She has no history of chronic renal disease. There are no known factors aggravating her hyperlipidemia. Pertinent negatives include no chest pain. There are no compliance problems.  Risk factors for coronary artery disease include dyslipidemia.  Hypertension This is a chronic problem. The current episode started more than 1 year ago. The problem is unchanged. The problem is controlled. Pertinent negatives include no chest pain, headaches or palpitations. There are no associated agents to hypertension. Risk factors for coronary artery disease include dyslipidemia. Past treatments include calcium channel blockers  and central alpha agonists. The current treatment provides moderate improvement. There is no history of chronic renal disease.      Past Medical History:  Diagnosis Date   Anemia    Arthritis    Rheumatoid Arthritis 2010   Asthma    Eczema    Renal disorder    Strep throat      Family History  Adopted: Yes  Problem Relation Age of Onset   Cancer Mother      Current Outpatient Medications:    albuterol (VENTOLIN HFA) 108 (90 Base) MCG/ACT inhaler, Inhale 2 puffs into the lungs every 6 (six) hours as needed for shortness of breath. For shortness of breath, Disp: 18 g, Rfl: 3   Ferrous Sulfate (IRON PO), Take by mouth., Disp: , Rfl:    Fluocinonide Emulsified Base 0.05 % CREA, APPLY  CREAM EXTERNALLY TO AFFECTED AREA TWICE DAILY AS NEEDED, Disp: 30 g, Rfl: 0   hydrOXYzine (ATARAX/VISTARIL) 25 MG tablet, Take 1 tablet (25 mg total) by mouth as needed., Disp: 30 tablet, Rfl: 3   lisinopril (ZESTRIL) 10 MG tablet, Take 1 tablet (10 mg total) by mouth daily., Disp: 90 tablet, Rfl: 1   Allergies  Allergen Reactions   Shellfish Allergy Anaphylaxis     Review of Systems  Constitutional: Negative.  Negative for fatigue.  Respiratory: Negative.   Cardiovascular: Negative.  Negative for chest pain, palpitations and leg swelling.  Gastrointestinal: Negative.   Endocrine: Negative.  Negative for polydipsia, polyphagia and polyuria.  Genitourinary: Negative.   Neurological: Negative for dizziness and headaches.  Psychiatric/Behavioral: Negative.  Today's Vitals   02/24/20 1448  BP: 110/60  Pulse: 96  Temp: 97.8 F (36.6 C)  TempSrc: Oral  Weight: 141 lb (64 kg)  Height: 5' 6.6" (1.692 m)   Body mass index is 22.35 kg/m.   Objective:  Physical Exam Constitutional:      General: She is not in acute distress.    Appearance: Normal appearance. She is well-developed.  HENT:     Head: Normocephalic and atraumatic.     Right Ear: Hearing normal.     Left  Ear: Hearing normal.  Eyes:     General: Lids are normal.     Funduscopic exam:    Right eye: No papilledema.        Left eye: No papilledema.  Neck:     Thyroid: No thyroid mass.     Vascular: No carotid bruit.  Cardiovascular:     Rate and Rhythm: Normal rate and regular rhythm.     Pulses: Normal pulses.     Heart sounds: Normal heart sounds. No murmur heard.   Pulmonary:     Effort: Pulmonary effort is normal. No respiratory distress.     Breath sounds: Normal breath sounds.  Chest:     Breasts:        Right: Normal. No mass or tenderness.        Left: Normal. No mass or tenderness.  Musculoskeletal:     Cervical back: Full passive range of motion without pain.  Neurological:     General: No focal deficit present.     Mental Status: She is alert and oriented to person, place, and time.     Cranial Nerves: No cranial nerve deficit.     Sensory: No sensory deficit.  Psychiatric:        Mood and Affect: Mood normal.        Behavior: Behavior normal.        Thought Content: Thought content normal.        Judgment: Judgment normal.         Assessment And Plan:     1. Essential hypertension, benign  Chronic, well controlled  She had her labs done and were reviewed during office visit, was also faxed to Dr. Belva Bertin, she was also given a copy - lisinopril (ZESTRIL) 10 MG tablet; Take 1 tablet (10 mg total) by mouth daily.  Dispense: 90 tablet; Refill: 1  2. Persistent proteinuria  Labs faxed to Dr. Belva Bertin,   Continue your follow up with Dr. Belva Bertin  3. History of asthma  Refilled her albuterol inhaler, has not had to use much recently  4. History of eczema  Refilled her fluocinonide cream no current exacerbation   Aimee Lund, RN    THE PATIENT IS ENCOURAGED TO PRACTICE SOCIAL DISTANCING DUE TO THE COVID-19 PANDEMIC.

## 2020-03-18 LAB — LIPID PANEL
Chol/HDL Ratio: 5.8 ratio — ABNORMAL HIGH (ref 0.0–4.4)
Cholesterol, Total: 225 mg/dL — ABNORMAL HIGH (ref 100–199)
HDL: 39 mg/dL — ABNORMAL LOW (ref >39)
LDL Chol Calc (NIH): 162 mg/dL — ABNORMAL HIGH (ref 0–99)
Triglycerides: 133 mg/dL (ref 0–149)
VLDL Cholesterol Cal: 24 mg/dL (ref 5–40)

## 2020-03-18 LAB — SPECIMEN STATUS REPORT

## 2020-04-30 ENCOUNTER — Encounter: Payer: Self-pay | Admitting: Nurse Practitioner

## 2020-04-30 ENCOUNTER — Other Ambulatory Visit: Payer: Self-pay

## 2020-04-30 ENCOUNTER — Telehealth (INDEPENDENT_AMBULATORY_CARE_PROVIDER_SITE_OTHER): Payer: BC Managed Care – PPO | Admitting: Nurse Practitioner

## 2020-04-30 VITALS — Temp 98.1°F

## 2020-04-30 DIAGNOSIS — J018 Other acute sinusitis: Secondary | ICD-10-CM

## 2020-04-30 MED ORDER — AMOXICILLIN-POT CLAVULANATE 875-125 MG PO TABS: 1.0000 | ORAL_TABLET | Freq: Two times a day (BID) | ORAL | 0 refills | Status: AC

## 2020-04-30 MED ORDER — HYDROXYZINE HCL 25 MG PO TABS: 25.0000 mg | ORAL_TABLET | ORAL | 3 refills | Status: DC | PRN

## 2020-04-30 NOTE — Progress Notes (Deleted)
  This visit occurred during the SARS-CoV-2 public health emergency.  Safety protocols were in place, including screening questions prior to the visit, additional usage of staff PPE, and extensive cleaning of exam room while observing appropriate contact time as indicated for disinfecting solutions.  Subjective:     Patient ID: Aimee Austin , female    DOB: 03/03/1961 , 59 y.o.   MRN: [id number]   Chief Complaint  Patient presents with  . Nasal Congestion    patient stated she has some pain above her eyes and in upper dry. she stated she has a dry cough and feels like her ribs are sore    HPI  HPI   Past Medical History:  Diagnosis Date  . Anemia   . Arthritis    Rheumatoid Arthritis 2010  . Asthma   . Eczema   . Renal disorder   . Strep throat      Family History  Adopted: Yes  Problem Relation Age of Onset  . Cancer Mother      Current Outpatient Medications:  .  albuterol (VENTOLIN HFA) 108 (90 Base) MCG/ACT inhaler, Inhale 2 puffs into the lungs every 6 (six) hours as needed for shortness of breath. For shortness of breath, Disp: 18 g, Rfl: 3 .  Ferrous Sulfate (IRON PO), Take by mouth., Disp: , Rfl:  .  Fluocinonide Emulsified Base 0.05 % CREA, Apply 1 application topically 3 (three) times daily., Disp: 60 g, Rfl: 1 .  hydrOXYzine (ATARAX/VISTARIL) 25 MG tablet, Take 1 tablet (25 mg total) by mouth as needed., Disp: 30 tablet, Rfl: 3 .  lisinopril (ZESTRIL) 10 MG tablet, Take 1 tablet (10 mg total) by mouth daily., Disp: 90 tablet, Rfl: 1   Allergies  Allergen Reactions  . Shellfish Allergy Anaphylaxis     Review of Systems   Today's Vitals   04/30/20 1402  Temp: 98.1 F (36.7 C)  TempSrc: Oral   There is no height or weight on file to calculate BMI.   Objective:  Physical Exam      Assessment And Plan:     There are no diagnoses linked to this encounter.    Patient was given opportunity to ask questions. Patient verbalized understanding of the  plan and was able to repeat key elements of the plan. All questions were answered to their satisfaction.  [id number] [place], CMA   I, [place], [place], have reviewed all documentation for this visit. The documentation on 04/30/20 for the exam, diagnosis, procedures, and orders are all accurate and complete.  THE PATIENT IS ENCOURAGED TO PRACTICE SOCIAL DISTANCING DUE TO THE COVID-19 PANDEMIC.

## 2020-04-30 NOTE — Progress Notes (Signed)
Virtual Visit via MyChart   This visit type was conducted due to national recommendations for restrictions regarding the COVID-19 Pandemic (e.g. social distancing) in an effort to limit this patient's exposure and mitigate transmission in our community.  Due to her co-morbid illnesses, this patient is at least at moderate risk for complications without adequate follow up.  This format is felt to be most appropriate for this patient at this time.  All issues noted in this document were discussed and addressed.  A limited physical exam was performed with this format.    This visit type was conducted due to national recommendations for restrictions regarding the COVID-19 Pandemic (e.g. social distancing) in an effort to limit this patient's exposure and mitigate transmission in our community.  Patients identity confirmed using two different identifiers.  This format is felt to be most appropriate for this patient at this time.  All issues noted in this document were discussed and addressed.  No physical exam was performed (except for noted visual exam findings with Video Visits).    Date:  04/30/2020   ID:  Aimee Austin, DOB August 08, 1961, MRN [id number]  Patient Location:  Home - spoke with Russ Halo  Provider location:   Office    Chief Complaint:  Cold symptoms  History of Present Illness:    Aimee Austin is a 59 y.o. female who presents via video conferencing for a telehealth visit today.    The patient does have symptoms concerning for COVID-19 infection (fever, chills, cough, or new shortness of breath).   URI  This is a new problem. The current episode started in the past 7 days (Last Wednesday). There has been no fever. Associated symptoms include congestion (nasal congestion) and coughing (dry cough). Pertinent negatives include no abdominal pain, chest pain, ear pain, headaches or nausea. Associated symptoms comments: Left jaw pain. She has tried NSAIDs (Equate Cold and Flu) for  the symptoms. The treatment provided no relief.     Past Medical History:  Diagnosis Date  . Anemia   . Arthritis    Rheumatoid Arthritis 2010  . Asthma   . Eczema   . Renal disorder   . Strep throat    Past Surgical History:  Procedure Laterality Date  . ABDOMINAL HYSTERECTOMY  03/1998   Uterus only     Current Meds  Medication Sig  . albuterol (VENTOLIN HFA) 108 (90 Base) MCG/ACT inhaler Inhale 2 puffs into the lungs every 6 (six) hours as needed for shortness of breath. For shortness of breath  . Ferrous Sulfate (IRON PO) Take by mouth 2 (two) times daily.   . Fluocinonide Emulsified Base 0.05 % CREA Apply 1 application topically 3 (three) times daily.  . hydrOXYzine (ATARAX/VISTARIL) 25 MG tablet Take 1 tablet (25 mg total) by mouth as needed.  Marland Kitchen lisinopril (ZESTRIL) 10 MG tablet Take 1 tablet (10 mg total) by mouth daily.  . Nutritional Supplements (VITAMIN D BOOSTER PO) Take 1 tablet by mouth daily.  . [DISCONTINUED] hydrOXYzine (ATARAX/VISTARIL) 25 MG tablet Take 1 tablet (25 mg total) by mouth as needed.     Allergies:   Shellfish allergy   Social History   Tobacco Use  . Smoking status: Never Smoker  . Smokeless tobacco: Never Used  Vaping Use  . Vaping Use: Never used  Substance Use Topics  . Alcohol use: No    Alcohol/week: 0.0 standard drinks  . Drug use: No     Family Hx: The patient's family history  includes Cancer in her mother. She was adopted.  ROS:   Please see the history of present illness.    Review of Systems  HENT: Positive for congestion (nasal congestion). Negative for ear pain.   Respiratory: Positive for cough (dry cough) and shortness of breath (intermittent).   Cardiovascular: Negative for chest pain.  Gastrointestinal: Negative for abdominal pain and nausea.  Musculoskeletal:       Chest tenderness on palpation.    Neurological: Negative for headaches.  Psychiatric/Behavioral: Negative.     All other systems reviewed and are  negative.   Labs/Other Tests and Data Reviewed:    Recent Labs: 08/14/2019: Platelets 219; TSH 0.910 02/20/2020: BUN 16; Creatinine, Ser 1.11; Hemoglobin 10.3; Potassium 3.9; Sodium 135   Recent Lipid Panel Lab Results  Component Value Date/Time   CHOL 225 (H) 02/20/2020 10:18 AM   TRIG 133 02/20/2020 10:18 AM   HDL 39 (L) 02/20/2020 10:18 AM   CHOLHDL 5.8 (H) 02/20/2020 10:18 AM   LDLCALC 162 (H) 02/20/2020 10:18 AM    Wt Readings from Last 3 Encounters:  02/24/20 141 lb (64 kg)  12/31/19 145 lb 3.2 oz (65.9 kg)  11/18/19 140 lb 6.4 oz (63.7 kg)     Exam:    Vital Signs:  Temp 98.1 F (36.7 C) (Oral)     Physical Exam Constitutional:      General: She is not in acute distress.    Appearance: Normal appearance.  Pulmonary:     Effort: Pulmonary effort is normal. No respiratory distress.     Breath sounds: No wheezing.  Neurological:     General: No focal deficit present.     Mental Status: She is alert and oriented to person, place, and time.     Cranial Nerves: No cranial nerve deficit.  Psychiatric:        Mood and Affect: Mood normal.        Behavior: Behavior normal.        Thought Content: Thought content normal.        Judgment: Judgment normal.     ASSESSMENT & PLAN:     1. Acute non-recurrent sinusitis of other sinus  She has symptoms of a sinus infection however I have encouraged her to go to be tested for covid even though she is vaccinated.  - amoxicillin-clavulanate (AUGMENTIN) 875-125 MG tablet; Take 1 tablet by mouth 2 (two) times daily for 10 days.  Dispense: 20 tablet; Refill: 0   COVID-19 Education: The signs and symptoms of COVID-19 were discussed with the patient and how to seek care for testing (follow up with PCP or arrange E-visit).  The importance of social distancing was discussed today.  Patient Risk:   After full review of this patients clinical status, I feel that they are at least moderate risk at this time.  Time:   Today, I  have spent 11 minutes/ seconds with the patient with telehealth technology discussing above diagnoses.     Medication Adjustments/Labs and Tests Ordered: Current medicines are reviewed at length with the patient today.  Concerns regarding medicines are outlined above.   Tests Ordered: No orders of the defined types were placed in this encounter.   Medication Changes: Meds ordered this encounter  Medications  . amoxicillin-clavulanate (AUGMENTIN) 875-125 MG tablet    Sig: Take 1 tablet by mouth 2 (two) times daily for 10 days.    Dispense:  20 tablet    Refill:  0  . hydrOXYzine (ATARAX/VISTARIL) 25 MG  tablet    Sig: Take 1 tablet (25 mg total) by mouth as needed.    Dispense:  30 tablet    Refill:  3    Disposition:  Follow up prn  Signed, Minette Brine, FNP

## 2020-05-20 ENCOUNTER — Telehealth: Payer: Self-pay | Admitting: Nurse Practitioner

## 2020-07-06 ENCOUNTER — Ambulatory Visit: Payer: Self-pay | Admitting: Nurse Practitioner

## 2020-07-06 ENCOUNTER — Other Ambulatory Visit: Payer: Self-pay

## 2020-08-19 ENCOUNTER — Encounter: Payer: BC Managed Care – PPO | Admitting: Nurse Practitioner

## 2020-09-29 ENCOUNTER — Encounter: Payer: Self-pay | Admitting: Nurse Practitioner

## 2020-10-22 ENCOUNTER — Encounter: Payer: Self-pay | Admitting: Nurse Practitioner

## 2020-11-12 ENCOUNTER — Encounter: Payer: Self-pay | Admitting: Nurse Practitioner

## 2020-11-12 ENCOUNTER — Ambulatory Visit: Payer: 59 | Admitting: Nurse Practitioner

## 2020-11-12 ENCOUNTER — Other Ambulatory Visit: Payer: Self-pay

## 2020-11-12 VITALS — BP 130/78 | HR 104 | Temp 98.1°F | Ht 66.6 in | Wt 146.8 lb

## 2020-11-12 DIAGNOSIS — K219 Gastro-esophageal reflux disease without esophagitis: Secondary | ICD-10-CM | POA: Diagnosis not present

## 2020-11-12 DIAGNOSIS — D582 Other hemoglobinopathies: Secondary | ICD-10-CM

## 2020-11-12 DIAGNOSIS — Z8709 Personal history of other diseases of the respiratory system: Secondary | ICD-10-CM

## 2020-11-12 DIAGNOSIS — I10 Essential (primary) hypertension: Secondary | ICD-10-CM

## 2020-11-12 DIAGNOSIS — Z13220 Encounter for screening for lipoid disorders: Secondary | ICD-10-CM

## 2020-11-12 DIAGNOSIS — Z0000 Encounter for general adult medical examination without abnormal findings: Secondary | ICD-10-CM | POA: Diagnosis not present

## 2020-11-12 DIAGNOSIS — Z114 Encounter for screening for human immunodeficiency virus [HIV]: Secondary | ICD-10-CM

## 2020-11-12 DIAGNOSIS — Z872 Personal history of diseases of the skin and subcutaneous tissue: Secondary | ICD-10-CM

## 2020-11-12 DIAGNOSIS — R801 Persistent proteinuria, unspecified: Secondary | ICD-10-CM

## 2020-11-12 DIAGNOSIS — H6123 Impacted cerumen, bilateral: Secondary | ICD-10-CM | POA: Diagnosis not present

## 2020-11-12 LAB — POCT URINALYSIS DIPSTICK
Bilirubin, UA: NEGATIVE
Glucose, UA: NEGATIVE
Ketones, UA: NEGATIVE
Leukocytes, UA: NEGATIVE
Nitrite, UA: NEGATIVE
Protein, UA: POSITIVE — AB
Spec Grav, UA: 1.015 (ref 1.010–1.025)
Urobilinogen, UA: 0.2 E.U./dL
pH, UA: 5.5 (ref 5.0–8.0)

## 2020-11-12 LAB — POCT UA - MICROALBUMIN
Creatinine, POC: 300 mg/dL
Microalbumin Ur, POC: 80 mg/L

## 2020-11-12 MED ORDER — ALBUTEROL SULFATE HFA 108 (90 BASE) MCG/ACT IN AERS: 2.0000 | INHALATION_SPRAY | Freq: Four times a day (QID) | RESPIRATORY_TRACT | 3 refills | Status: DC | PRN

## 2020-11-12 MED ORDER — HYDROXYZINE HCL 25 MG PO TABS: 25.0000 mg | ORAL_TABLET | ORAL | 3 refills | Status: DC | PRN

## 2020-11-12 NOTE — Patient Instructions (Signed)
Health Maintenance, Female Adopting a healthy lifestyle and getting preventive care are important in promoting health and wellness. Ask your health care provider about:  The right schedule for you to have regular tests and exams.  Things you can do on your own to prevent diseases and keep yourself healthy. What should I know about diet, weight, and exercise? Eat a healthy diet  Eat a diet that includes plenty of vegetables, fruits, low-fat dairy products, and lean protein.  Do not eat a lot of foods that are high in solid fats, added sugars, or sodium.   Maintain a healthy weight Body mass index (BMI) is used to identify weight problems. It estimates body fat based on height and weight. Your health care provider can help determine your BMI and help you achieve or maintain a healthy weight. Get regular exercise Get regular exercise. This is one of the most important things you can do for your health. Most adults should:  Exercise for at least 150 minutes each week. The exercise should increase your heart rate and make you sweat (moderate-intensity exercise).  Do strengthening exercises at least twice a week. This is in addition to the moderate-intensity exercise.  Spend less time sitting. Even light physical activity can be beneficial. Watch cholesterol and blood lipids Have your blood tested for lipids and cholesterol at 58 years of age, then have this test every 5 years. Have your cholesterol levels checked more often if:  Your lipid or cholesterol levels are high.  You are older than 58 years of age.  You are at high risk for heart disease. What should I know about cancer screening? Depending on your health history and family history, you may need to have cancer screening at various ages. This may include screening for:  Breast cancer.  Cervical cancer.  Colorectal cancer.  Skin cancer.  Lung cancer. What should I know about heart disease, diabetes, and high blood  pressure? Blood pressure and heart disease  High blood pressure causes heart disease and increases the risk of stroke. This is more likely to develop in people who have high blood pressure readings, are of African descent, or are overweight.  Have your blood pressure checked: ? Every 3-5 years if you are 18-39 years of age. ? Every year if you are 40 years old or older. Diabetes Have regular diabetes screenings. This checks your fasting blood sugar level. Have the screening done:  Once every three years after age 40 if you are at a normal weight and have a low risk for diabetes.  More often and at a younger age if you are overweight or have a high risk for diabetes. What should I know about preventing infection? Hepatitis B If you have a higher risk for hepatitis B, you should be screened for this virus. Talk with your health care provider to find out if you are at risk for hepatitis B infection. Hepatitis C Testing is recommended for:  Everyone born from 1945 through 1965.  Anyone with known risk factors for hepatitis C. Sexually transmitted infections (STIs)  Get screened for STIs, including gonorrhea and chlamydia, if: ? You are sexually active and are younger than 58 years of age. ? You are older than 58 years of age and your health care provider tells you that you are at risk for this type of infection. ? Your sexual activity has changed since you were last screened, and you are at increased risk for chlamydia or gonorrhea. Ask your health care provider   if you are at risk.  Ask your health care provider about whether you are at high risk for HIV. Your health care provider may recommend a prescription medicine to help prevent HIV infection. If you choose to take medicine to prevent HIV, you should first get tested for HIV. You should then be tested every 3 months for as long as you are taking the medicine. Pregnancy  If you are about to stop having your period (premenopausal) and  you may become pregnant, seek counseling before you get pregnant.  Take 400 to 800 micrograms (mcg) of folic acid every day if you become pregnant.  Ask for birth control (contraception) if you want to prevent pregnancy. Osteoporosis and menopause Osteoporosis is a disease in which the bones lose minerals and strength with aging. This can result in bone fractures. If you are 65 years old or older, or if you are at risk for osteoporosis and fractures, ask your health care provider if you should:  Be screened for bone loss.  Take a calcium or vitamin D supplement to lower your risk of fractures.  Be given hormone replacement therapy (HRT) to treat symptoms of menopause. Follow these instructions at home: Lifestyle  Do not use any products that contain nicotine or tobacco, such as cigarettes, e-cigarettes, and chewing tobacco. If you need help quitting, ask your health care provider.  Do not use street drugs.  Do not share needles.  Ask your health care provider for help if you need support or information about quitting drugs. Alcohol use  Do not drink alcohol if: ? Your health care provider tells you not to drink. ? You are pregnant, may be pregnant, or are planning to become pregnant.  If you drink alcohol: ? Limit how much you use to 0-1 drink a day. ? Limit intake if you are breastfeeding.  Be aware of how much alcohol is in your drink. In the [place] one drink equals one 12 oz bottle of beer (355 mL), one 5 oz glass of wine (148 mL), or one 1 oz glass of hard liquor (44 mL). General instructions  Schedule regular health, dental, and eye exams.  Stay current with your vaccines.  Tell your health care provider if: ? You often feel depressed. ? You have ever been abused or do not feel safe at home. Summary  Adopting a healthy lifestyle and getting preventive care are important in promoting health and wellness.  Follow your health care provider's instructions about healthy  diet, exercising, and getting tested or screened for diseases.  Follow your health care provider's instructions on monitoring your cholesterol and blood pressure. This information is not intended to replace advice given to you by your health care provider. Make sure you discuss any questions you have with your health care provider. Document Revised: 08/15/2018 Document Reviewed: 08/15/2018 Elsevier Patient Education  2021 [employer]  

## 2020-11-12 NOTE — Progress Notes (Addendum)
I,Tianna Badgett,acting as a Education administrator for [employer], NP.,have documented all relevant documentation on the behalf of [employer], NP,as directed by  Bary Castilla, NP while in the presence of Bary Castilla, NP.  This visit occurred during the SARS-CoV-2 public health emergency.  Safety protocols were in place, including screening questions prior to the visit, additional usage of staff PPE, and extensive cleaning of exam room while observing appropriate contact time as indicated for disinfecting solutions.  Subjective:     Patient ID: Aimee Austin , female    DOB: Mar 01, 1961 , 60 y.o.   MRN: [id number]   Chief Complaint  Patient presents with  . Annual Exam    HPI  Patient is here for physical exam. She is seen by Irwin Brakeman for her PAP. She has a partial hysterectomy. She will make an appt with her. She had a mammogram. She complains GERD pain . She is working on a diet plan with her friend that every week she cuts down on something. She is also going to incorporate exercise in it as well. She has had both of her COVID shots and booster but not the flu shot.     Wt Readings from Last 3 Encounters: 11/12/20 : 146 lb 12.8 oz (66.6 kg) 02/24/20 : 141 lb (64 kg) 12/31/19 : 145 lb 3.2 oz (65.9 kg)    Hypertension This is a chronic problem. The current episode started more than 1 year ago. The problem is unchanged. The problem is controlled. Pertinent negatives include no chest pain, headaches or palpitations. There are no associated agents to hypertension. Risk factors for coronary artery disease include dyslipidemia. Past treatments include calcium channel blockers and central alpha agonists. The current treatment provides moderate improvement. There is no history of chronic renal disease.  Hyperlipidemia This is a chronic problem. The current episode started more than 1 year ago. The problem is uncontrolled. She has no history of chronic renal disease. There are no  known factors aggravating her hyperlipidemia. Pertinent negatives include no chest pain or myalgias. There are no compliance problems.  Risk factors for coronary artery disease include dyslipidemia.     Past Medical History:  Diagnosis Date  . Anemia   . Arthritis    Rheumatoid Arthritis 2010  . Asthma   . Eczema   . Renal disorder   . Strep throat      Family History  Adopted: Yes  Problem Relation Age of Onset  . Cancer Mother      Current Outpatient Medications:  .  Ferrous Sulfate (IRON PO), Take by mouth 2 (two) times daily. , Disp: , Rfl:  .  Fluocinonide Emulsified Base 0.05 % CREA, Apply 1 application topically 3 (three) times daily., Disp: 60 g, Rfl: 1 .  lisinopril (ZESTRIL) 10 MG tablet, Take 1 tablet (10 mg total) by mouth daily., Disp: 90 tablet, Rfl: 1 .  Nutritional Supplements (VITAMIN D BOOSTER PO), Take 1 tablet by mouth daily., Disp: , Rfl:  .  albuterol (VENTOLIN HFA) 108 (90 Base) MCG/ACT inhaler, Inhale 2 puffs into the lungs every 6 (six) hours as needed for shortness of breath. For shortness of breath, Disp: 18 g, Rfl: 3 .  hydrOXYzine (ATARAX/VISTARIL) 25 MG tablet, Take 1 tablet (25 mg total) by mouth as needed., Disp: 30 tablet, Rfl: 3   Allergies  Allergen Reactions  . Shellfish Allergy Anaphylaxis    She has had a partial hysterectomy and she sees a Materials engineer on [employer].   Negative for:  breast discharge, breast lump(s), breast pain and breast self exam. Associated symptoms include abnormal vaginal bleeding. Pertinent negatives include abnormal bleeding (hematology), anxiety, decreased libido, depression, difficulty falling sleep, dyspareunia, history of infertility, nocturia, sexual dysfunction, sleep disturbances, urinary incontinence, urinary urgency, vaginal discharge and vaginal itching. Diet regular.The patient states her exercise level is    . The patient's tobacco use is:  Social History   Tobacco Use  Smoking Status Never Smoker  Smokeless  Tobacco Never Used  . She has been exposed to passive smoke. The patient's alcohol use is:  Social History   Substance and Sexual Activity  Alcohol Use No  . Alcohol/week: 0.0 standard drinks    Review of Systems  Constitutional: Negative.  Negative for chills and fatigue.  HENT: Negative for sore throat.   Eyes: Negative.   Respiratory: Negative.   Cardiovascular: Negative.  Negative for chest pain and palpitations.  Gastrointestinal: Negative.   Endocrine: Negative.  Negative for polydipsia, polyphagia and polyuria.  Genitourinary: Negative.   Musculoskeletal: Negative.  Negative for back pain, myalgias and neck stiffness.  Skin: Negative.   Allergic/Immunologic: Negative.   Neurological: Negative.  Negative for weakness, numbness and headaches.  Hematological: Negative.   Psychiatric/Behavioral: Negative.      Today's Vitals   11/12/20 0843 11/12/20 0847  BP:  130/78  Pulse:  (!) 104  Temp: 98.1 F (36.7 C) 98.1 F (36.7 C)  TempSrc: Oral Oral  Weight: 146 lb 12.8 oz (66.6 kg)   Height: 5' 6.6" (1.692 m)    Body mass index is 23.27 kg/m.   Objective:  Physical Exam Vitals and nursing note reviewed.  Constitutional:      Appearance: Normal appearance. She is obese.  HENT:     Head: Normocephalic and atraumatic.     Right Ear: Tympanic membrane, ear canal and external ear normal. There is impacted cerumen.     Left Ear: Tympanic membrane, ear canal and external ear normal. There is impacted cerumen.     Nose: Nose normal.     Mouth/Throat:     Mouth: Mucous membranes are moist.     Pharynx: Oropharynx is clear.  Eyes:     Extraocular Movements: Extraocular movements intact.     Conjunctiva/sclera: Conjunctivae normal.     Pupils: Pupils are equal, round, and reactive to light.  Cardiovascular:     Rate and Rhythm: Normal rate and regular rhythm.     Pulses: Normal pulses.     Heart sounds: Normal heart sounds. No murmur heard.   Pulmonary:     Effort:  Pulmonary effort is normal. No respiratory distress.     Breath sounds: Normal breath sounds. No wheezing.  Abdominal:     General: Abdomen is flat. Bowel sounds are normal.     Palpations: Abdomen is soft.  Genitourinary:    Comments: Deferred. Patient sees a OBGYN  Musculoskeletal:        General: Normal range of motion.     Cervical back: Normal range of motion and neck supple.  Skin:    General: Skin is warm and dry.     Capillary Refill: Capillary refill takes less than 2 seconds.  Neurological:     General: No focal deficit present.     Mental Status: She is alert and oriented to person, place, and time.  Psychiatric:        Mood and Affect: Mood normal.        Behavior: Behavior normal.  Thought Content: Thought content normal.        Judgment: Judgment normal.         Assessment And Plan:     1. Health maintenance examination -Behavior modification discussed and diet history reviewed.  -Pt continue to exercise regularly and modify with low GI. -Recommend intake of daily multivitamins, vitamin D, and calcium  -Recommend mammogram for preventive screenings, as well as recommend immunization including influenza, TDAP.   2. Essential hypertension, benign -Chronic, stable  - POCT Urinalysis Dipstick (81002) - POCT UA - Microalbumin - EKG 12-Lead- NSR  - CMP14+EGFR  3. History of asthma - albuterol (VENTOLIN HFA) 108 (90 Base) MCG/ACT inhaler; Inhale 2 puffs into the lungs every 6 (six) hours as needed for shortness of breath. For shortness of breath  Dispense: 18 g; Refill: 3  4. History of eczema - hydrOXYzine (ATARAX/VISTARIL) 25 MG tablet; Take 1 tablet (25 mg total) by mouth as needed.  Dispense: 30 tablet; Refill: 3  5. Encounter for screening for HIV - HIV Antibody (routine testing w rflx)  6. Gastroesophageal reflux disease without esophagitis -Patient will try OTC Pepcid, TUMS -Educated patient about eating less foods that will irritate her stomach  such as decreasing her intake of spicy foods. -Educated patient to stay upright after eating for atleast 1-2 hours.   7. Hemoglobin C trait (HCC) -Chronic, stable  -Will check her labs  -Takes OTC iron pill  - CBC - Iron and IBC ([id number])  8. Bilateral impacted cerumen -Used ear irrigation with curette in bilateral ears.   9. Encounter for screening for lipid disorder -Educated patient on the importance of eating a healthy diet including increase in fish intake, decrease red meats and high fatty foods.  - Lipid panel  10. Persistent proteinuria -Will check and assess  - Hemoglobin A1c  -Follow up in 3 months or early if needed.   Patient was given opportunity to ask questions. Patient verbalized understanding of the plan and was able to repeat key elements of the plan. All questions were answered to their satisfaction.   Bary Castilla, NP   I, Bary Castilla, NP, have reviewed all documentation for this visit. The documentation on 11/12/20 for the exam, diagnosis, procedures, and orders are all accurate and complete.  THE PATIENT IS ENCOURAGED TO PRACTICE SOCIAL DISTANCING DUE TO THE COVID-19 PANDEMIC.

## 2020-11-13 LAB — CMP14+EGFR
ALT: 13 IU/L (ref 0–32)
AST: 18 IU/L (ref 0–40)
Albumin/Globulin Ratio: 1.7 (ref 1.2–2.2)
Albumin: 4.8 g/dL (ref 3.8–4.9)
Alkaline Phosphatase: 75 IU/L (ref 44–121)
BUN/Creatinine Ratio: 17 (ref 9–23)
BUN: 25 mg/dL — ABNORMAL HIGH (ref 6–24)
Bilirubin Total: 0.2 mg/dL (ref 0.0–1.2)
CO2: 23 mmol/L (ref 20–29)
Calcium: 10 mg/dL (ref 8.7–10.2)
Chloride: 106 mmol/L (ref 96–106)
Creatinine, Ser: 1.46 mg/dL — ABNORMAL HIGH (ref 0.57–1.00)
Globulin, Total: 2.8 g/dL (ref 1.5–4.5)
Glucose: 91 mg/dL (ref 65–99)
Potassium: 5.4 mmol/L — ABNORMAL HIGH (ref 3.5–5.2)
Sodium: 146 mmol/L — ABNORMAL HIGH (ref 134–144)
Total Protein: 7.6 g/dL (ref 6.0–8.5)
eGFR: 41 mL/min/{1.73_m2} — ABNORMAL LOW (ref >59)

## 2020-11-13 LAB — LIPID PANEL
Chol/HDL Ratio: 5.7 ratio — ABNORMAL HIGH (ref 0.0–4.4)
Cholesterol, Total: 223 mg/dL — ABNORMAL HIGH (ref 100–199)
HDL: 39 mg/dL — ABNORMAL LOW (ref >39)
LDL Chol Calc (NIH): 159 mg/dL — ABNORMAL HIGH (ref 0–99)
Triglycerides: 139 mg/dL (ref 0–149)
VLDL Cholesterol Cal: 25 mg/dL (ref 5–40)

## 2020-11-13 LAB — CBC
Hematocrit: 34.7 % (ref 34.0–46.6)
Hemoglobin: 11 g/dL — ABNORMAL LOW (ref 11.1–15.9)
MCH: 24.8 pg — ABNORMAL LOW (ref 26.6–33.0)
MCHC: 31.7 g/dL (ref 31.5–35.7)
MCV: 78 fL — ABNORMAL LOW (ref 79–97)
Platelets: 215 10*3/uL (ref 150–450)
RBC: 4.44 x10E6/uL (ref 3.77–5.28)
RDW: 14.1 % (ref 11.7–15.4)
WBC: 6.5 10*3/uL (ref 3.4–10.8)

## 2020-11-13 LAB — IRON AND TIBC
Iron Saturation: 16 % (ref 15–55)
Iron: 48 ug/dL (ref 27–159)
Total Iron Binding Capacity: 294 ug/dL (ref 250–450)
UIBC: 246 ug/dL (ref 131–425)

## 2020-11-13 LAB — HEMOGLOBIN A1C
Est. average glucose Bld gHb Est-mCnc: 117 mg/dL
Hgb A1c MFr Bld: 5.7 % — ABNORMAL HIGH (ref 4.8–5.6)

## 2020-11-13 LAB — HIV ANTIBODY (ROUTINE TESTING W REFLEX): HIV Screen 4th Generation wRfx: NONREACTIVE

## 2020-11-20 ENCOUNTER — Other Ambulatory Visit: Payer: Self-pay | Admitting: Nurse Practitioner

## 2020-11-20 DIAGNOSIS — Z1231 Encounter for screening mammogram for malignant neoplasm of breast: Secondary | ICD-10-CM

## 2020-12-22 ENCOUNTER — Telehealth: Payer: Self-pay

## 2020-12-22 NOTE — Telephone Encounter (Signed)
Pt called she had a fall went to urgent care over a wk ago still having trouble/painful breathing the pain did ease up but now has gotten worse. Per JM pt needs to go back to urgent care because of the trouble/painful breathing. Pt agrees stated she would go back to urgent care

## 2021-01-13 ENCOUNTER — Other Ambulatory Visit: Payer: Self-pay

## 2021-01-13 ENCOUNTER — Ambulatory Visit
Admission: RE | Admit: 2021-01-13 | Discharge: 2021-01-13 | Disposition: A | Discharge status: Home / Self Care | Payer: BC Managed Care – PPO | Source: Ambulatory Visit | Attending: Nurse Practitioner | Admitting: Nurse Practitioner

## 2021-01-13 DIAGNOSIS — Z1231 Encounter for screening mammogram for malignant neoplasm of breast: Secondary | ICD-10-CM

## 2021-01-25 ENCOUNTER — Other Ambulatory Visit: Payer: Self-pay

## 2021-01-25 ENCOUNTER — Telehealth (INDEPENDENT_AMBULATORY_CARE_PROVIDER_SITE_OTHER): Payer: 59 | Admitting: Nurse Practitioner

## 2021-01-25 ENCOUNTER — Encounter: Payer: Self-pay | Admitting: Nurse Practitioner

## 2021-01-25 ENCOUNTER — Telehealth: Payer: Self-pay

## 2021-01-25 VITALS — Ht 65.0 in | Wt 135.0 lb

## 2021-01-25 DIAGNOSIS — R0981 Nasal congestion: Secondary | ICD-10-CM

## 2021-01-25 DIAGNOSIS — J040 Acute laryngitis: Secondary | ICD-10-CM | POA: Diagnosis not present

## 2021-01-25 DIAGNOSIS — N644 Mastodynia: Secondary | ICD-10-CM

## 2021-01-25 MED ORDER — AZITHROMYCIN 250 MG PO TABS: ORAL_TABLET | ORAL | 0 refills | Status: AC

## 2021-01-25 NOTE — Progress Notes (Signed)
Virtual Visit via MyChart   This visit type was conducted due to national recommendations for restrictions regarding the COVID-19 Pandemic (e.g. social distancing) in an effort to limit this patient's exposure and mitigate transmission in our community.  Due to her co-morbid illnesses, this patient is at least at moderate risk for complications without adequate follow up.  This format is felt to be most appropriate for this patient at this time.  All issues noted in this document were discussed and addressed.  A limited physical exam was performed with this format.    This visit type was conducted due to national recommendations for restrictions regarding the COVID-19 Pandemic (e.g. social distancing) in an effort to limit this patient's exposure and mitigate transmission in our community.  Patients identity confirmed using two different identifiers.  This format is felt to be most appropriate for this patient at this time.  All issues noted in this document were discussed and addressed.  No physical exam was performed (except for noted visual exam findings with Video Visits).    Date:  02/22/2021   ID:  Aimee Austin, DOB March 26, 1961, MRN [id number]  Patient Location:  Home - spoke with Rosamond  Provider location:   Office    Chief Complaint:  hoarse  History of Present Illness:    Aimee Austin is a 60 y.o. female who presents via video conferencing for a telehealth visit today.    The patient does have symptoms concerning for COVID-19 infection (fever, chills, cough, or new shortness of breath).   She has laryngitis since Saturday, she also has facial pain. She denies having runny nose. She has nasal congestion but "nothing is coming out".  She took Delsym DM without relief.  She has been drinking hot tea with honey without relief.   She continues to have left breast pain and seen at Urgent Care which was all normal. However she continues to have discomfort. I will refer her to GYN for  further evaluation.     Past Medical History:  Diagnosis Date   Anemia    Arthritis    Rheumatoid Arthritis 2010   Asthma    Eczema    Renal disorder    Strep throat    Past Surgical History:  Procedure Laterality Date   ABDOMINAL HYSTERECTOMY  03/1998   Uterus only     Current Meds  Medication Sig   [EXPIRED] azithromycin (ZITHROMAX) 250 MG tablet Take 2 tablets (500 mg) on  Day 1,  followed by 1 tablet (250 mg) once daily on Days 2 through 5.     Allergies:   Shellfish allergy and Sulfa antibiotics   Social History   Tobacco Use   Smoking status: Never   Smokeless tobacco: Never  Vaping Use   Vaping Use: Never used  Substance Use Topics   Alcohol use: No    Alcohol/week: 0.0 standard drinks   Drug use: No     Family Hx: The patient's family history includes Cancer in her mother. She was adopted.  ROS:   Please see the history of present illness.    Review of Systems  Constitutional: Negative.   HENT: Negative.  Negative for sinus pain.   Respiratory:  Positive for cough (dry cough). Negative for shortness of breath and wheezing.   Cardiovascular: Negative.   Musculoskeletal: Negative.   Neurological: Negative.   Psychiatric/Behavioral: Negative.     All other systems reviewed and are negative.   Labs/Other Tests and Data Reviewed:  Recent Labs: 02/17/2021: ALT 110; BUN 23; Creatinine, Ser 0.97; Hemoglobin 9.1; Magnesium 1.7; Platelets 189; Potassium 4.7; Sodium 141   Recent Lipid Panel Lab Results  Component Value Date/Time   CHOL 177 02/17/2021 09:07 AM   TRIG 175 (H) 02/17/2021 09:07 AM   HDL 33 (L) 02/17/2021 09:07 AM   CHOLHDL 5.4 (H) 02/17/2021 09:07 AM   LDLCALC 113 (H) 02/17/2021 09:07 AM    Wt Readings from Last 3 Encounters:  02/17/21 137 lb 12.8 oz (62.5 kg)  01/25/21 135 lb (61.2 kg)  11/12/20 146 lb 12.8 oz (66.6 kg)     Exam:    Vital Signs:  Ht 5\' 5"  (1.651 m)   Wt 135 lb (61.2 kg)   BMI 22.47 kg/m     Physical  Exam Constitutional:      General: She is not in acute distress.    Appearance: Normal appearance.  Cardiovascular:     Rate and Rhythm: Normal rate and regular rhythm.  Pulmonary:     Effort: Pulmonary effort is normal. No respiratory distress.     Breath sounds: Normal breath sounds. No wheezing.  Neurological:     Mental Status: She is alert.    ASSESSMENT & PLAN:    1. Nasal congestion Will treat with azithromycin She is to come to office to have a PCR test done, continue to remain in isolation until you get your results - azithromycin (ZITHROMAX) 250 MG tablet; Take 2 tablets (500 mg) on  Day 1,  followed by 1 tablet (250 mg) once daily on Days 2 through 5.  Dispense: 6 each; Refill: 0  2. Laryngitis Warm water gargles with salt  - azithromycin (ZITHROMAX) 250 MG tablet; Take 2 tablets (500 mg) on  Day 1,  followed by 1 tablet (250 mg) once daily on Days 2 through 5.  Dispense: 6 each; Refill: 0   COVID-19 Education: The signs and symptoms of COVID-19 were discussed with the patient and how to seek care for testing (follow up with PCP or arrange E-visit).  The importance of social distancing was discussed today.  Patient Risk:   After full review of this patients clinical status, I feel that they are at least moderate risk at this time.  Time:   Today, I have spent 10 minutes/ seconds with the patient with telehealth technology discussing above diagnoses.     Medication Adjustments/Labs and Tests Ordered: Current medicines are reviewed at length with the patient today.  Concerns regarding medicines are outlined above.   Tests Ordered: Orders Placed This Encounter  Procedures   Novel Coronavirus, NAA ([facility])   SARS-COV-2, NAA 2 DAY TAT   Ambulatory referral to Obstetrics / Gynecology    Medication Changes: Meds ordered this encounter  Medications   azithromycin (ZITHROMAX) 250 MG tablet    Sig: Take 2 tablets (500 mg) on  Day 1,  followed by 1 tablet (250 mg)  once daily on Days 2 through 5.    Dispense:  6 each    Refill:  0    Disposition:  Follow up prn  Signed, Minette Brine, FNP

## 2021-01-25 NOTE — Telephone Encounter (Signed)
Patient consented to virtual appt YL,RMA

## 2021-01-26 ENCOUNTER — Encounter: Payer: Self-pay | Admitting: Nurse Practitioner

## 2021-01-26 LAB — NOVEL CORONAVIRUS, NAA: SARS-CoV-2, NAA: NOT DETECTED

## 2021-01-26 LAB — SARS-COV-2, NAA 2 DAY TAT

## 2021-02-17 ENCOUNTER — Ambulatory Visit: Payer: 59 | Admitting: Nurse Practitioner

## 2021-02-17 ENCOUNTER — Other Ambulatory Visit: Payer: Self-pay

## 2021-02-17 ENCOUNTER — Encounter: Payer: Self-pay | Admitting: Nurse Practitioner

## 2021-02-17 VITALS — BP 118/66 | HR 102 | Temp 99.1°F | Ht 65.0 in | Wt 137.8 lb

## 2021-02-17 DIAGNOSIS — I10 Essential (primary) hypertension: Secondary | ICD-10-CM | POA: Diagnosis not present

## 2021-02-17 DIAGNOSIS — E7800 Pure hypercholesterolemia, unspecified: Secondary | ICD-10-CM | POA: Diagnosis not present

## 2021-02-17 DIAGNOSIS — Z8709 Personal history of other diseases of the respiratory system: Secondary | ICD-10-CM

## 2021-02-17 DIAGNOSIS — J0101 Acute recurrent maxillary sinusitis: Secondary | ICD-10-CM

## 2021-02-17 LAB — CMP14+EGFR
ALT: 110 IU/L — ABNORMAL HIGH (ref 0–32)
AST: 88 IU/L — ABNORMAL HIGH (ref 0–40)
Albumin/Globulin Ratio: 1.6 (ref 1.2–2.2)
Albumin: 4.2 g/dL (ref 3.8–4.9)
Alkaline Phosphatase: 91 IU/L (ref 44–121)
BUN/Creatinine Ratio: 24 — ABNORMAL HIGH (ref 9–23)
BUN: 23 mg/dL (ref 6–24)
Bilirubin Total: 0.3 mg/dL (ref 0.0–1.2)
CO2: 20 mmol/L (ref 20–29)
Calcium: 10.1 mg/dL (ref 8.7–10.2)
Chloride: 103 mmol/L (ref 96–106)
Creatinine, Ser: 0.97 mg/dL (ref 0.57–1.00)
Globulin, Total: 2.7 g/dL (ref 1.5–4.5)
Glucose: 89 mg/dL (ref 65–99)
Potassium: 4.7 mmol/L (ref 3.5–5.2)
Sodium: 141 mmol/L (ref 134–144)
Total Protein: 6.9 g/dL (ref 6.0–8.5)
eGFR: 68 mL/min/{1.73_m2} (ref >59)

## 2021-02-17 MED ORDER — AMOXICILLIN-POT CLAVULANATE 875-125 MG PO TABS: 1.0000 | ORAL_TABLET | Freq: Two times a day (BID) | ORAL | 0 refills | Status: AC

## 2021-02-17 MED ORDER — ALBUTEROL SULFATE HFA 108 (90 BASE) MCG/ACT IN AERS
2.0000 | INHALATION_SPRAY | Freq: Four times a day (QID) | RESPIRATORY_TRACT | 3 refills | Status: DC | PRN
Start: 2021-02-17 — End: 2021-02-18

## 2021-02-17 NOTE — Progress Notes (Signed)
I,Katawbba Wiggins,acting as a Education administrator for [employer], FNP.,have documented all relevant documentation on the behalf of Minette Brine, FNP,as directed by  Minette Brine, FNP while in the presence of Minette Brine, [place].  This visit occurred during the SARS-CoV-2 public health emergency.  Safety protocols were in place, including screening questions prior to the visit, additional usage of staff PPE, and extensive cleaning of exam room while observing appropriate contact time as indicated for disinfecting solutions.  Subjective:     Patient ID: Aimee Austin , female    DOB: 12/15/1960 , 60 y.o.   MRN: [id number]   Chief Complaint  Patient presents with   Hypertension   Hyperlipidemia    HPI  She presents today for her 4 month HTN and cholesterol follow up. At home her BP is usually normal at home.  Exercise: She tries to walk as much as she can.  Diet: She is eating a healthy diet. She lost some weight.  Wt Readings from Last 3 Encounters: 02/17/21 : 137 lb 12.8 oz (62.5 kg) 01/25/21 : 135 lb (61.2 kg) 11/12/20 : 146 lb 12.8 oz (66.6 kg) She sees her nephrologist next week.   Hypertension This is a chronic problem. The current episode started more than 1 year ago. The problem is unchanged. The problem is controlled. Associated symptoms include headaches. Pertinent negatives include no chest pain, palpitations or shortness of breath. There are no associated agents to hypertension. Risk factors for coronary artery disease include dyslipidemia. Past treatments include calcium channel blockers and central alpha agonists. The current treatment provides moderate improvement. There is no history of chronic renal disease.  Hyperlipidemia This is a chronic problem. The current episode started more than 1 year ago. The problem is uncontrolled. She has no history of chronic renal disease. There are no known factors aggravating her hyperlipidemia. Pertinent negatives include no chest pain or shortness of  breath. There are no compliance problems.  Risk factors for coronary artery disease include dyslipidemia.    Past Medical History:  Diagnosis Date   Anemia    Arthritis    Rheumatoid Arthritis 2010   Asthma    Eczema    Renal disorder    Strep throat      Family History  Adopted: Yes  Problem Relation Age of Onset   Cancer Mother      Current Outpatient Medications:    amoxicillin-clavulanate (AUGMENTIN) 875-125 MG tablet, Take 1 tablet by mouth 2 (two) times daily for 7 days., Disp: 14 tablet, Rfl: 0   Ferrous Sulfate (IRON PO), Take by mouth daily., Disp: , Rfl:    Fluocinonide Emulsified Base 0.05 % CREA, Apply 1 application topically 3 (three) times daily., Disp: 60 g, Rfl: 1   hydrOXYzine (ATARAX/VISTARIL) 25 MG tablet, Take 1 tablet (25 mg total) by mouth as needed., Disp: 30 tablet, Rfl: 3   lisinopril (ZESTRIL) 10 MG tablet, Take 1 tablet (10 mg total) by mouth daily., Disp: 90 tablet, Rfl: 1   Nutritional Supplements (VITAMIN D BOOSTER PO), Take 1 tablet by mouth daily., Disp: , Rfl:    albuterol (VENTOLIN HFA) 108 (90 Base) MCG/ACT inhaler, Inhale 2 puffs into the lungs every 6 (six) hours as needed for shortness of breath. For shortness of breath, Disp: 18 g, Rfl: 3   Allergies  Allergen Reactions   Shellfish Allergy Anaphylaxis   Sulfa Antibiotics Anaphylaxis     Review of Systems  Constitutional:  Negative for chills and fever.  HENT:  Positive for  congestion, nosebleeds, postnasal drip and sinus pain. Negative for tinnitus.   Respiratory: Negative.  Negative for cough, shortness of breath and wheezing.   Cardiovascular: Negative.  Negative for chest pain and palpitations.  Gastrointestinal: Negative.   Musculoskeletal:  Negative for arthralgias.  Neurological:  Positive for headaches. Negative for weakness and numbness.  Psychiatric/Behavioral: Negative.    All other systems reviewed and are negative.   Today's Vitals   02/17/21 0842  BP: 118/66  Pulse:  (!) 102  Temp: 99.1 F (37.3 C)  TempSrc: Oral  Weight: 137 lb 12.8 oz (62.5 kg)  Height: $Remove'5\' 5"'[id number]$  (1.651 m)   Body mass index is 22.93 kg/m.  Wt Readings from Last 3 Encounters:  02/17/21 137 lb 12.8 oz (62.5 kg)  01/25/21 135 lb (61.2 kg)  11/12/20 146 lb 12.8 oz (66.6 kg)    BP Readings from Last 3 Encounters:  02/17/21 118/66  11/12/20 130/78  02/24/20 110/60    Objective:  Physical Exam Constitutional:      Appearance: Normal appearance.  HENT:     Head: Normocephalic and atraumatic.     Right Ear: Tympanic membrane normal.     Left Ear: Tympanic membrane normal.     Nose: Congestion present. No nasal deformity.  Cardiovascular:     Rate and Rhythm: Normal rate and regular rhythm.     Pulses: Normal pulses.     Heart sounds: Normal heart sounds. No murmur heard. Pulmonary:     Effort: Pulmonary effort is normal. No respiratory distress.     Breath sounds: Normal breath sounds. No wheezing.  Skin:    General: Skin is warm and dry.     Capillary Refill: Capillary refill takes less than 2 seconds.  Neurological:     Mental Status: She is alert and oriented to person, place, and time.  Psychiatric:        Mood and Affect: Mood normal.        Behavior: Behavior normal.        Thought Content: Thought content normal.        Assessment And Plan:     1. Essential hypertension, benign -Limit the intake of processed foods and salt intake. You should increase your intake of green vegetables and fruits. Limit the use of alcohol. Limit fast foods and fried foods. Avoid high fatty saturated and trans fat foods. Keep yourself hydrated with drinking water. Avoid red meats. Eat lean meats instead. Exercise for atleast 30-45 min for atleast 4-5 times a week.  -Chronic, stable , continue meds.  - CBC with Diff - CMP14+EGFR - Magnesium  2. Elevated cholesterol --Chronic, stable  -Continue current meds, tolerating well.  -Encouraged to continue with a low fat diet avoiding  fast food and fried food.  - Lipid panel  3. History of asthma -chronic, stable  - albuterol (VENTOLIN HFA) 108 (90 Base) MCG/ACT inhaler; Inhale 2 puffs into the lungs every 6 (six) hours as needed for shortness of breath. For shortness of breath  Dispense: 18 g; Refill: 3  4. Acute recurrent maxillary sinusitis -Sinus tenderness along with congestion. -Will treat for sinusitis  -Encouraged patient to drink a lot of water and hydrate  -Take OTC antihistamines as needed.  -Flonase as needed.  - amoxicillin-clavulanate (AUGMENTIN) 875-125 MG tablet; Take 1 tablet by mouth 2 (two) times daily for 7 days.  Dispense: 14 tablet; Refill: 0    The patient was encouraged to call or send a message through [place] for any questions  or concerns.   Side effects and appropriate use of all the medication(s) were discussed with the patient today. Patient advised to use the medication(s) as directed by their healthcare provider. The patient was encouraged to read, review, and understand all associated package inserts and contact our office with any questions or concerns. The patient accepts the risks of the treatment plan and had an opportunity to ask questions.    Follow up: if symptoms persist or do not get better.   Patient was given opportunity to ask questions. Patient verbalized understanding of the plan and was able to repeat key elements of the plan. All questions were answered to their satisfaction.  Raman Keland Peyton, DNP   I, Raman Hawley Michel have reviewed all documentation for this visit. The documentation on 01/14/21 for the exam, diagnosis, procedures, and orders are all accurate and complete.   IF YOU HAVE BEEN REFERRED TO A SPECIALIST, IT MAY TAKE 1-2 WEEKS TO SCHEDULE/PROCESS THE REFERRAL. IF YOU HAVE NOT HEARD FROM US/SPECIALIST IN TWO WEEKS, PLEASE GIVE [place] A CALL AT [phone].   THE PATIENT IS ENCOURAGED TO PRACTICE SOCIAL DISTANCING DUE TO THE COVID-19 PANDEMIC.

## 2021-02-18 ENCOUNTER — Other Ambulatory Visit: Payer: Self-pay | Admitting: Nurse Practitioner

## 2021-02-18 DIAGNOSIS — Z8709 Personal history of other diseases of the respiratory system: Secondary | ICD-10-CM

## 2021-02-18 LAB — LIPID PANEL
Chol/HDL Ratio: 5.4 ratio — ABNORMAL HIGH (ref 0.0–4.4)
Cholesterol, Total: 177 mg/dL (ref 100–199)
HDL: 33 mg/dL — ABNORMAL LOW (ref >39)
LDL Chol Calc (NIH): 113 mg/dL — ABNORMAL HIGH (ref 0–99)
Triglycerides: 175 mg/dL — ABNORMAL HIGH (ref 0–149)
VLDL Cholesterol Cal: 31 mg/dL (ref 5–40)

## 2021-02-18 LAB — CBC WITH DIFFERENTIAL/PLATELET
Basophils Absolute: 0 10*3/uL (ref 0.0–0.2)
Basos: 1 %
EOS (ABSOLUTE): 0.1 10*3/uL (ref 0.0–0.4)
Eos: 2 %
Hematocrit: 28.6 % — ABNORMAL LOW (ref 34.0–46.6)
Hemoglobin: 9.1 g/dL — ABNORMAL LOW (ref 11.1–15.9)
Immature Grans (Abs): 0 10*3/uL (ref 0.0–0.1)
Immature Granulocytes: 0 %
Lymphocytes Absolute: 1.7 10*3/uL (ref 0.7–3.1)
Lymphs: 29 %
MCH: 23.8 pg — ABNORMAL LOW (ref 26.6–33.0)
MCHC: 31.8 g/dL (ref 31.5–35.7)
MCV: 75 fL — ABNORMAL LOW (ref 79–97)
Monocytes Absolute: 0.5 10*3/uL (ref 0.1–0.9)
Monocytes: 9 %
Neutrophils Absolute: 3.7 10*3/uL (ref 1.4–7.0)
Neutrophils: 59 %
Platelets: 189 10*3/uL (ref 150–450)
RBC: 3.82 x10E6/uL (ref 3.77–5.28)
RDW: 12.9 % (ref 11.7–15.4)
WBC: 6.1 10*3/uL (ref 3.4–10.8)

## 2021-02-18 LAB — MAGNESIUM: Magnesium: 1.7 mg/dL (ref 1.6–2.3)

## 2021-02-18 MED ORDER — ALBUTEROL SULFATE HFA 108 (90 BASE) MCG/ACT IN AERS
2.0000 | INHALATION_SPRAY | Freq: Four times a day (QID) | RESPIRATORY_TRACT | 2 refills | Status: DC | PRN
Start: 2021-02-18 — End: 2021-04-22

## 2021-02-22 ENCOUNTER — Encounter: Payer: Self-pay | Admitting: Nurse Practitioner

## 2021-02-24 ENCOUNTER — Encounter: Payer: Self-pay | Admitting: Nurse Practitioner

## 2021-02-24 ENCOUNTER — Ambulatory Visit: Payer: 59 | Admitting: Nurse Practitioner

## 2021-02-24 ENCOUNTER — Other Ambulatory Visit: Payer: Self-pay

## 2021-02-24 VITALS — BP 128/66 | HR 113 | Temp 98.4°F | Ht 64.2 in | Wt 133.0 lb

## 2021-02-24 DIAGNOSIS — M79662 Pain in left lower leg: Secondary | ICD-10-CM | POA: Diagnosis not present

## 2021-02-24 DIAGNOSIS — M79661 Pain in right lower leg: Secondary | ICD-10-CM

## 2021-02-24 NOTE — Patient Instructions (Signed)
Weakness Weakness is a lack of strength. You may feel weak all over your body (generalized), or you may feel weak in one part of your body (focal). There are many potential causes of weakness. Sometimes, the cause of your weakness may not be known. Some causes of weakness can be serious, so it isimportant to see your doctor. Follow these instructions at home: Activity Rest as needed. Try to get enough sleep. Most adults need 7-8 hours of sleep each night. Talk to your doctor about how much sleep you need each night. Do exercises, such as arm curls and leg raises, for 30 minutes at least 2 days a week or as told by your doctor. Think about working with a physical therapist or trainer to help you get stronger. General instructions  Take over-the-counter and prescription medicines only as told by your doctor. Eat a healthy, well-balanced diet. This includes: Proteins to build muscles, such as lean meats and fish. Fresh fruits and vegetables. Carbohydrates to boost energy, such as whole grains. Drink enough fluid to keep your pee (urine) pale yellow. Keep all follow-up visits as told by your doctor. This is important.  Contact a doctor if: Your weakness does not get better or it gets worse. Your weakness affects your ability to: Think clearly. Do your normal daily activities. Get help right away if you: Have sudden weakness on one side of your face or body. Have chest pain. Have trouble breathing or shortness of breath. Have problems with your vision. Have trouble talking or swallowing. Have trouble standing or walking. Are light-headed. Pass out (lose consciousness). Summary Weakness is a lack of strength. You may feel weak all over your body or just in one part of your body. There are many potential causes of weakness. Sometimes, the cause of your weakness may not be known. Rest as needed, and try to get enough sleep. Most adults need 7-8 hours of sleep each night. Eat a healthy,  well-balanced diet. This information is not intended to replace advice given to you by your health care provider. Make sure you discuss any questions you have with your healthcare provider. Document Revised: 03/28/2018 Document Reviewed: 03/28/2018 Elsevier Patient Education  2022 Elsevier Inc.  

## 2021-02-24 NOTE — Progress Notes (Signed)
I,Tianna Badgett,acting as a Education administrator for [employer], NP.,have documented all relevant documentation on the behalf of [employer], NP,as directed by  Bary Castilla, NP while in the presence of Bary Castilla, NP.  This visit occurred during the SARS-CoV-2 public health emergency.  Safety protocols were in place, including screening questions prior to the visit, additional usage of staff PPE, and extensive cleaning of exam room while observing appropriate contact time as indicated for disinfecting solutions.  Subjective:     Patient ID: Aimee Austin , female    DOB: Dec 25, 1960 , 60 y.o.   MRN: [id number]   Chief Complaint  Patient presents with   Extremity Weakness    HPI  Patient is here for weakness in her legs. Her legs started feeling very weak last week. She does have a history of RA. She is also reporting shortness of breath.   Extremity Weakness  The pain is present in the right upper leg, right hip, right lower leg, right knee, left lower leg, left knee, left upper leg and left hip. This is a new problem. The current episode started in the past 7 days. There has been no history of extremity trauma. The problem occurs constantly. The problem has been gradually improving. The quality of the pain is described as pounding. The pain is at a severity of 5/10. The pain is moderate. Pertinent negatives include no numbness. She has tried acetaminophen and rest for the symptoms. The treatment provided mild relief. Family history includes rheumatoid arthritis. Her past medical history is significant for osteoarthritis and rheumatoid arthritis.    Past Medical History:  Diagnosis Date   Anemia    Arthritis    Rheumatoid Arthritis 2010   Asthma    Eczema    Renal disorder    Strep throat      Family History  Adopted: Yes  Problem Relation Age of Onset   Cancer Mother      Current Outpatient Medications:    albuterol (VENTOLIN HFA) 108 (90 Base) MCG/ACT inhaler,  Inhale 2 puffs into the lungs every 6 (six) hours as needed for wheezing or shortness of breath., Disp: 8 g, Rfl: 2   amoxicillin-clavulanate (AUGMENTIN) 875-125 MG tablet, Take 1 tablet by mouth 2 (two) times daily for 7 days., Disp: 14 tablet, Rfl: 0   Ferrous Sulfate (IRON PO), Take by mouth daily., Disp: , Rfl:    Fluocinonide Emulsified Base 0.05 % CREA, Apply 1 application topically 3 (three) times daily., Disp: 60 g, Rfl: 1   hydrOXYzine (ATARAX/VISTARIL) 25 MG tablet, Take 1 tablet (25 mg total) by mouth as needed., Disp: 30 tablet, Rfl: 3   lisinopril (ZESTRIL) 10 MG tablet, Take 1 tablet (10 mg total) by mouth daily., Disp: 90 tablet, Rfl: 1   Nutritional Supplements (VITAMIN D BOOSTER PO), Take 1 tablet by mouth daily., Disp: , Rfl:    Allergies  Allergen Reactions   Shellfish Allergy Anaphylaxis   Sulfa Antibiotics Anaphylaxis     Review of Systems  Constitutional: Negative.  Negative for chills and fatigue.  HENT: Negative.  Negative for congestion, sinus pressure and sneezing.   Eyes: Negative.   Respiratory:  Positive for shortness of breath. Negative for cough and wheezing.        SOB when she walks   Cardiovascular: Negative.  Negative for chest pain and palpitations.  Gastrointestinal: Negative.  Negative for diarrhea and nausea.  Endocrine: Negative.   Genitourinary: Negative.   Musculoskeletal:  Positive for arthralgias and extremity  weakness. Negative for back pain, joint swelling and neck pain.       Bilateral leg pain in the back.   Skin: Negative.   Allergic/Immunologic: Negative.   Neurological:  Positive for weakness. Negative for tremors, numbness and headaches.       Weakness in both legs.   Hematological: Negative.   Psychiatric/Behavioral: Negative.      Today's Vitals   02/24/21 1015  BP: 128/66  Pulse: (!) 113  Temp: 98.4 F (36.9 C)  TempSrc: Oral  Weight: 133 lb (60.3 kg)  Height: 5' 4.2" (1.631 m)   Body mass index is 22.69 kg/m.    Objective:  Physical Exam Constitutional:      Appearance: Normal appearance.  Cardiovascular:     Rate and Rhythm: Normal rate and regular rhythm.     Pulses: Normal pulses.     Heart sounds: Normal heart sounds. No murmur heard. Pulmonary:     Effort: Pulmonary effort is normal. No respiratory distress.     Breath sounds: Normal breath sounds. No wheezing.  Musculoskeletal:        General: No swelling.     Right lower leg: Tenderness present. No swelling. No edema.     Left lower leg: Tenderness present. No swelling. No edema.  Skin:    General: Skin is warm and dry.     Capillary Refill: Capillary refill takes less than 2 seconds.  Neurological:     Mental Status: She is alert and oriented to person, place, and time.        Assessment And Plan:     1. Pain in both lower legs - VAS [place] LOWER EXTREMITY VENOUS (DVT); Future  -Since pt. Came in leg pain in both legs with shortness of breath. I have advised the patient to go to the nearest emergency room or urgent care to be further evaluated for a blood clot/DVT. The patient verbalized understanding and states that she will go to urgent care for an [place].  -I have advised the warning signs of a DVT to the patient which includes shortness of breath, leg pain and swelling.  -The patient verbalized understanding.   Follow up: if symptoms persist or do not get better.   The patient was encouraged to call or send a message through [place] for any questions or concerns.   Patient was given opportunity to ask questions. Patient verbalized understanding of the plan and was able to repeat key elements of the plan. All questions were answered to their satisfaction.  Raman Saleah Rishel, DNP   I, Raman Halo Shevlin have reviewed all documentation for this visit. The documentation on 02/24/21 for the exam, diagnosis, procedures, and orders are all accurate and complete.    IF YOU HAVE BEEN REFERRED TO A SPECIALIST, IT MAY TAKE 1-2 WEEKS TO  SCHEDULE/PROCESS THE REFERRAL. IF YOU HAVE NOT HEARD FROM US/SPECIALIST IN TWO WEEKS, PLEASE GIVE [place] A CALL AT [phone].   THE PATIENT IS ENCOURAGED TO PRACTICE SOCIAL DISTANCING DUE TO THE COVID-19 PANDEMIC.

## 2021-03-05 ENCOUNTER — Other Ambulatory Visit: Payer: Self-pay | Admitting: Urology

## 2021-03-05 DIAGNOSIS — R3129 Other microscopic hematuria: Secondary | ICD-10-CM

## 2021-03-31 ENCOUNTER — Other Ambulatory Visit: Payer: 59

## 2021-04-01 ENCOUNTER — Other Ambulatory Visit: Payer: 59

## 2021-04-15 ENCOUNTER — Ambulatory Visit
Admission: RE | Admit: 2021-04-15 | Discharge: 2021-04-15 | Disposition: A | Discharge status: Home / Self Care | Payer: BC Managed Care – PPO | Source: Ambulatory Visit | Attending: Urology | Admitting: Urology

## 2021-04-15 DIAGNOSIS — R3129 Other microscopic hematuria: Secondary | ICD-10-CM

## 2021-04-21 ENCOUNTER — Other Ambulatory Visit: Payer: Self-pay

## 2021-04-21 ENCOUNTER — Encounter: Payer: BC Managed Care – PPO | Admitting: Nurse Practitioner

## 2021-04-22 ENCOUNTER — Telehealth: Payer: Self-pay

## 2021-04-22 ENCOUNTER — Telehealth (INDEPENDENT_AMBULATORY_CARE_PROVIDER_SITE_OTHER): Payer: BC Managed Care – PPO | Admitting: Nurse Practitioner

## 2021-04-22 ENCOUNTER — Encounter: Payer: Self-pay | Admitting: Nurse Practitioner

## 2021-04-22 VITALS — BP 128/87 | HR 108 | Temp 98.5°F | Ht 64.2 in | Wt 130.0 lb

## 2021-04-22 DIAGNOSIS — M79661 Pain in right lower leg: Secondary | ICD-10-CM

## 2021-04-22 DIAGNOSIS — R0981 Nasal congestion: Secondary | ICD-10-CM | POA: Diagnosis not present

## 2021-04-22 DIAGNOSIS — M79662 Pain in left lower leg: Secondary | ICD-10-CM | POA: Diagnosis not present

## 2021-04-22 DIAGNOSIS — R059 Cough, unspecified: Secondary | ICD-10-CM

## 2021-04-22 DIAGNOSIS — Z20822 Contact with and (suspected) exposure to covid-19: Secondary | ICD-10-CM | POA: Diagnosis not present

## 2021-04-22 DIAGNOSIS — Z8709 Personal history of other diseases of the respiratory system: Secondary | ICD-10-CM | POA: Diagnosis not present

## 2021-04-22 MED ORDER — BENZONATATE 100 MG PO CAPS: 100.0000 mg | ORAL_CAPSULE | Freq: Three times a day (TID) | ORAL | 1 refills | Status: DC | PRN

## 2021-04-22 MED ORDER — GUAIFENESIN-DM 100-10 MG/5ML PO SYRP: 5.0000 mL | ORAL_SOLUTION | ORAL | 0 refills | Status: DC | PRN

## 2021-04-22 MED ORDER — ALBUTEROL SULFATE HFA 108 (90 BASE) MCG/ACT IN AERS: 2.0000 | INHALATION_SPRAY | Freq: Four times a day (QID) | RESPIRATORY_TRACT | 2 refills | Status: DC | PRN

## 2021-04-22 MED ORDER — AMOXICILLIN-POT CLAVULANATE 875-125 MG PO TABS: 1.0000 | ORAL_TABLET | Freq: Two times a day (BID) | ORAL | 0 refills | Status: AC

## 2021-04-22 NOTE — Progress Notes (Signed)
Virtual Visit via 100% video visit.    This visit type was conducted due to national recommendations for restrictions regarding the COVID-19 Pandemic (e.g. social distancing) in an effort to limit this patient's exposure and mitigate transmission in our community.  Due to her co-morbid illnesses, this patient is at least at moderate risk for complications without adequate follow up.  This format is felt to be most appropriate for this patient at this time.  All issues noted in this document were discussed and addressed.  A limited physical exam was performed with this format.    This visit type was conducted due to national recommendations for restrictions regarding the COVID-19 Pandemic (e.g. social distancing) in an effort to limit this patient's exposure and mitigate transmission in our community.  Patients identity confirmed using two different identifiers.  This format is felt to be most appropriate for this patient at this time.  All issues noted in this document were discussed and addressed.  No physical exam was performed (except for noted visual exam findings with Video Visits).    Date:  04/22/2021   ID:  [name], DOB 01/22/1961, MRN [id number]  Patient Location:  Home   Provider location:   Office   Chief Complaint:  congestion and bad cough.   History of Present Illness:    Aimee Austin is a 60 y.o. female who presents via video conferencing for a telehealth visit today.    The patient does have symptoms concerning for COVID-19 infection (fever, chills, cough, or new shortness of breath).   Congestion and bad cough. Started on Sunday. She was at wedding. She is vaccinated and has had 2 booster. No fever. She has shortness of breath due to asthma. She is taking albuterol. She is tried and has fatigue.   URI  Associated symptoms include congestion and coughing. Pertinent negatives include no chest pain, ear pain, headaches or joint pain.    Past Medical History:   Diagnosis Date   Anemia    Arthritis    Rheumatoid Arthritis 2010   Asthma    Eczema    Renal disorder    Strep throat    Past Surgical History:  Procedure Laterality Date   ABDOMINAL HYSTERECTOMY  03/1998   Uterus only     Current Meds  Medication Sig   benzonatate (TESSALON PERLES) 100 MG capsule Take 1 capsule (100 mg total) by mouth 3 (three) times daily as needed for cough.   Ferrous Sulfate (IRON PO) Take by mouth daily.   Fluocinonide Emulsified Base 0.05 % CREA Apply 1 application topically 3 (three) times daily.   guaiFENesin-dextromethorphan (ROBITUSSIN DM) 100-10 MG/5ML syrup Take 5 mLs by mouth every 4 (four) hours as needed for cough.   hydrOXYzine (ATARAX/VISTARIL) 25 MG tablet Take 1 tablet (25 mg total) by mouth as needed.   lisinopril (ZESTRIL) 10 MG tablet Take 1 tablet (10 mg total) by mouth daily.   Nutritional Supplements (VITAMIN D BOOSTER PO) Take 1 tablet by mouth daily.   [DISCONTINUED] albuterol (VENTOLIN HFA) 108 (90 Base) MCG/ACT inhaler Inhale 2 puffs into the lungs every 6 (six) hours as needed for wheezing or shortness of breath.     Allergies:   Shellfish allergy and Sulfa antibiotics   Social History   Tobacco Use   Smoking status: Never   Smokeless tobacco: Never  Vaping Use   Vaping Use: Never used  Substance Use Topics   Alcohol use: No    Alcohol/week: 0.0 standard  drinks   Drug use: No     Family Hx: The patient's family history includes Cancer in her mother. She was adopted.  ROS:   Please see the history of present illness.    Review of Systems  Constitutional:  Positive for malaise/fatigue. Negative for chills and fever.  HENT:  Positive for congestion. Negative for ear pain.   Respiratory:  Positive for cough and shortness of breath.   Cardiovascular:  Negative for chest pain and palpitations.  Musculoskeletal:  Negative for joint pain and myalgias.       Chronic leg pain bilaterally  Neurological:  Negative for weakness  and headaches.   All other systems reviewed and are negative.   Labs/Other Tests and Data Reviewed:    Recent Labs: 02/17/2021: ALT 110; BUN 23; Creatinine, Ser 0.97; Hemoglobin 9.1; Magnesium 1.7; Platelets 189; Potassium 4.7; Sodium 141   Recent Lipid Panel Lab Results  Component Value Date/Time   CHOL 177 02/17/2021 09:07 AM   TRIG 175 (H) 02/17/2021 09:07 AM   HDL 33 (L) 02/17/2021 09:07 AM   CHOLHDL 5.4 (H) 02/17/2021 09:07 AM   LDLCALC 113 (H) 02/17/2021 09:07 AM    Wt Readings from Last 3 Encounters:  04/22/21 130 lb (59 kg)  02/24/21 133 lb (60.3 kg)  02/17/21 137 lb 12.8 oz (62.5 kg)     Exam:    Vital Signs:  BP 128/87   Pulse (!) 108   Temp 98.5 F (36.9 C)   Ht 5' 4.2" (1.631 m)   Wt 130 lb (59 kg)   BMI 22.18 kg/m     Physical Exam Vitals and nursing note reviewed.  HENT:     Head: Normocephalic and atraumatic.  Pulmonary:     Effort: Pulmonary effort is normal.  Neurological:     Mental Status: She is alert and oriented to person, place, and time.  Psychiatric:        Mood and Affect: Affect normal.    ASSESSMENT & PLAN:    1. Exposure to COVID-19 virus - POC COVID-19  2. Cough - guaiFENesin-dextromethorphan (ROBITUSSIN DM) 100-10 MG/5ML syrup; Take 5 mLs by mouth every 4 (four) hours as needed for cough.  Dispense: 118 mL; Refill: 0 - benzonatate (TESSALON PERLES) 100 MG capsule; Take 1 capsule (100 mg total) by mouth 3 (three) times daily as needed for cough.  Dispense: 30 capsule; Refill: 1 - POC COVID-19  3. Sinus congestion - amoxicillin-clavulanate (AUGMENTIN) 875-125 MG tablet; Take 1 tablet by mouth 2 (two) times daily for 7 days.  Dispense: 14 tablet; Refill: 0  4. History of asthma - albuterol (VENTOLIN HFA) 108 (90 Base) MCG/ACT inhaler; Inhale 2 puffs into the lungs every 6 (six) hours as needed for wheezing or shortness of breath.  Dispense: 8 g; Refill: 2  5. Pain in both lower legs -History of leg pain. She had a prior  order for [place] however that was never done. Will put in another order for her.  - VAS [place] LOWER EXTREMITY VENOUS (DVT); Future  4. History of asthma - albuterol (VENTOLIN HFA) 108 (90 Base) MCG/ACT inhaler; Inhale 2 puffs into the lungs every 6 (six) hours as needed for wheezing or shortness of breath.  Dispense: 8 g; Refill: 2  5. Pain in both lower legs - VAS [place] LOWER EXTREMITY VENOUS (DVT); Future  The patient was encouraged to call or send a message through [place] for any questions or concerns.   Follow up: if symptoms persist or  do not get better.   Side effects and appropriate use of all the medication(s) were discussed with the patient today. Patient advised to use the medication(s) as directed by their healthcare provider. The patient was encouraged to read, review, and understand all associated package inserts and contact our office with any questions or concerns. The patient accepts the risks of the treatment plan and had an opportunity to ask questions.    Advised patient to take Vitamin C, D, Zinc. Keep yourself hydrated with a lot of water and rest. Take Delsym for cough and Mucinex. Take Tylenol or pain reliever every 4-6 hours as needed for pain/fever/body ache. Educated patient if symptoms get worse or if she experiences any SOB or pain in her legs to seek immediate emergency care. Continue to monitor your pulse oxygen. Call us if you have any questions. Quarantine for 5 days if tested positive and no symptoms or 10 days if tested positive and have symptoms. Wear a mask around other people.     COVID-19 Education: The signs and symptoms of COVID-19 were discussed with the patient and how to seek care for testing (follow up with PCP or arrange E-visit).  The importance of social distancing was discussed today.  Patient Risk:   After full review of this patients clinical status, I feel that they are at least moderate risk at this time.  Time:   Today, I have spent 15 minutes/  seconds with the patient with telehealth technology discussing above diagnoses.     Medication Adjustments/Labs and Tests Ordered: Current medicines are reviewed at length with the patient today.  Concerns regarding medicines are outlined above.   Tests Ordered: Orders Placed This Encounter  Procedures   POC COVID-19   VAS [place] LOWER EXTREMITY VENOUS (DVT)    Medication Changes: Meds ordered this encounter  Medications   guaiFENesin-dextromethorphan (ROBITUSSIN DM) 100-10 MG/5ML syrup    Sig: Take 5 mLs by mouth every 4 (four) hours as needed for cough.    Dispense:  118 mL    Refill:  0   benzonatate (TESSALON PERLES) 100 MG capsule    Sig: Take 1 capsule (100 mg total) by mouth 3 (three) times daily as needed for cough.    Dispense:  30 capsule    Refill:  1   albuterol (VENTOLIN HFA) 108 (90 Base) MCG/ACT inhaler    Sig: Inhale 2 puffs into the lungs every 6 (six) hours as needed for wheezing or shortness of breath.    Dispense:  8 g    Refill:  2    Ok to fill Proair inhaler    Disposition:  Follow up if symptoms get worse or don't get better.   Signed, Bary Castilla, NP

## 2021-04-22 NOTE — Telephone Encounter (Signed)
The pt was notified that she has covid, and to isolate for 5 days. The pt was told to let the office know how she is doing tomorrow.

## 2021-04-23 ENCOUNTER — Ambulatory Visit (HOSPITAL_COMMUNITY): Payer: BC Managed Care – PPO

## 2021-04-29 NOTE — Progress Notes (Signed)
Erroneous encounter

## 2021-05-14 ENCOUNTER — Ambulatory Visit (HOSPITAL_COMMUNITY)
Admission: RE | Admit: 2021-05-14 | Discharge: 2021-05-14 | Disposition: A | Discharge status: Home / Self Care | Payer: BC Managed Care – PPO | Source: Ambulatory Visit | Attending: Cardiovascular Disease | Admitting: Cardiovascular Disease

## 2021-05-14 ENCOUNTER — Other Ambulatory Visit: Payer: Self-pay

## 2021-05-14 DIAGNOSIS — M79662 Pain in left lower leg: Secondary | ICD-10-CM | POA: Diagnosis not present

## 2021-05-14 DIAGNOSIS — M79661 Pain in right lower leg: Secondary | ICD-10-CM | POA: Insufficient documentation

## 2021-05-24 ENCOUNTER — Encounter: Payer: Self-pay | Admitting: Nurse Practitioner

## 2021-05-24 ENCOUNTER — Other Ambulatory Visit: Payer: Self-pay

## 2021-05-24 ENCOUNTER — Ambulatory Visit: Payer: BC Managed Care – PPO | Admitting: Nurse Practitioner

## 2021-05-24 VITALS — BP 124/60 | Temp 98.8°F | Ht 64.4 in | Wt 127.2 lb

## 2021-05-24 DIAGNOSIS — R198 Other specified symptoms and signs involving the digestive system and abdomen: Secondary | ICD-10-CM

## 2021-05-24 DIAGNOSIS — R0609 Other forms of dyspnea: Secondary | ICD-10-CM

## 2021-05-24 DIAGNOSIS — N644 Mastodynia: Secondary | ICD-10-CM | POA: Diagnosis not present

## 2021-05-24 DIAGNOSIS — R0600 Dyspnea, unspecified: Secondary | ICD-10-CM | POA: Diagnosis not present

## 2021-05-24 DIAGNOSIS — R634 Abnormal weight loss: Secondary | ICD-10-CM | POA: Diagnosis not present

## 2021-05-24 NOTE — Progress Notes (Signed)
I,Tianna Badgett,acting as a Education administrator for [employer], FNP.,have documented all relevant documentation on the behalf of Minette Brine, FNP,as directed by  Minette Brine, FNP while in the presence of Minette Brine, [place].  This visit occurred during the SARS-CoV-2 public health emergency.  Safety protocols were in place, including screening questions prior to the visit, additional usage of staff PPE, and extensive cleaning of exam room while observing appropriate contact time as indicated for disinfecting solutions.  Subjective:     Patient ID: Aimee Austin , female    DOB: 03/18/1961 , 60 y.o.   MRN: [id number]   Chief Complaint  Patient presents with   Breast Pain   Weight Loss    HPI  Patient is here today for bilateral breast pain. She states that this has been occurring for about 2 weeks. Reports feeling like her breast are aching and heavy. Her last mammogram was in May.  When she lays down her pain is better. No medications.   She is also having issues with weight loss and constipation. She had covid August 17th, she had a cough and fatigue. The week prior she had diarrhea for a week. She is unable to fit her clothes, wearing pants with a top over it. She had her colonoscopy in 2017.  She is drinking ensure two times a day with breakfast and dinner.   Wt Readings from Last 3 Encounters: 05/24/21 : 127 lb 3.2 oz (57.7 kg) 04/22/21 : 130 lb (59 kg) - reported weight by patient 02/24/21 : 133 lb (60.3 kg)    Constipation This is a new problem. The problem is unchanged. The patient is on a high fiber diet. She Does not exercise regularly. There has Been adequate water intake. Pertinent negatives include no abdominal pain. She has tried diet changes (prunes) for the symptoms. There is no history of abdominal surgery.    Past Medical History:  Diagnosis Date   Anemia    Arthritis    Rheumatoid Arthritis 2010   Asthma    Eczema    Renal disorder    Strep throat      Family History   Adopted: Yes  Problem Relation Age of Onset   Cancer Mother      Current Outpatient Medications:    albuterol (VENTOLIN HFA) 108 (90 Base) MCG/ACT inhaler, Inhale 2 puffs into the lungs every 6 (six) hours as needed for wheezing or shortness of breath., Disp: 8 g, Rfl: 2   benzonatate (TESSALON PERLES) 100 MG capsule, Take 1 capsule (100 mg total) by mouth 3 (three) times daily as needed for cough., Disp: 30 capsule, Rfl: 1   Ferrous Sulfate (IRON PO), Take by mouth daily., Disp: , Rfl:    Fluocinonide Emulsified Base 0.05 % CREA, Apply 1 application topically 3 (three) times daily., Disp: 60 g, Rfl: 1   guaiFENesin-dextromethorphan (ROBITUSSIN DM) 100-10 MG/5ML syrup, Take 5 mLs by mouth every 4 (four) hours as needed for cough., Disp: 118 mL, Rfl: 0   hydrOXYzine (ATARAX/VISTARIL) 25 MG tablet, Take 1 tablet (25 mg total) by mouth as needed., Disp: 30 tablet, Rfl: 3   lisinopril (ZESTRIL) 10 MG tablet, Take 1 tablet (10 mg total) by mouth daily., Disp: 90 tablet, Rfl: 1   Nutritional Supplements (VITAMIN D BOOSTER PO), Take 1 tablet by mouth daily., Disp: , Rfl:    Allergies  Allergen Reactions   Shellfish Allergy Anaphylaxis   Sulfa Antibiotics Anaphylaxis     Review of Systems  Constitutional: Negative.  Respiratory: Negative.    Cardiovascular:  Positive for palpitations.  Gastrointestinal:  Positive for constipation. Negative for abdominal pain.  Neurological: Negative.     Today's Vitals   05/24/21 1548  BP: 124/60  Temp: 98.8 F (37.1 C)  TempSrc: Oral  Weight: 127 lb 3.2 oz (57.7 kg)  Height: 5' 4.4" (1.636 m)   Body mass index is 21.56 kg/m.  Wt Readings from Last 3 Encounters:  05/24/21 127 lb 3.2 oz (57.7 kg)  04/22/21 130 lb (59 kg)  02/24/21 133 lb (60.3 kg)    Objective:  Physical Exam Vitals reviewed.  Constitutional:      General: She is not in acute distress.    Appearance: Normal appearance.  Cardiovascular:     Rate and Rhythm: Normal rate  and regular rhythm.     Pulses: Normal pulses.     Heart sounds: Normal heart sounds. No murmur heard. Pulmonary:     Effort: Pulmonary effort is normal. No respiratory distress.     Breath sounds: Normal breath sounds. No wheezing.  Musculoskeletal:        General: No tenderness.  Neurological:     General: No focal deficit present.     Mental Status: She is alert and oriented to person, place, and time.     Cranial Nerves: No cranial nerve deficit.     Motor: No weakness.  Psychiatric:        Mood and Affect: Mood is anxious.        Behavior: Behavior normal.        Thought Content: Thought content normal.        Judgment: Judgment normal.     Comments: She is talking a little faster than usual and constantly shaking her legs        Assessment And Plan:     1. Abnormal weight loss Wt Readings from Last 3 Encounters:  05/24/21 127 lb 3.2 oz (57.7 kg)  04/22/21 130 lb (59 kg)  02/24/21 133 lb (60.3 kg)   Her pants are loose fitting, will check for metabolic causes May be related to having Covid vs diarrhea vs metabolic - CBC - TSH - Vitamin B12 - Hemoglobin A1c  2. Breast pain Tenderness to bilateral breast throughout on palpation - MM Digital Diagnostic Bilat; Future  3. Change in bowel movement She has been having more lose stools accompanied by the weight loss will refer to GI for further evaluation - CBC - Ambulatory referral to Gastroenterology  4. Dyspnea on exertion Comments: EKG done with Inferolateral ST-elevation -possible repolarization variant -consider injury, will check BNP, CXR and Echo before considering referral to Cardiology. This could also be related to her recent diagnosis of Covid. - ECHOCARDIOGRAM COMPLETE; Future - EKG 12-Lead - Brain natriuretic peptide - DG Chest 2 View; Future    Patient was given opportunity to ask questions. Patient verbalized understanding of the plan and was able to repeat key elements of the plan. All questions  were answered to their satisfaction.  Minette Brine, FNP   I, Minette Brine, FNP, have reviewed all documentation for this visit. The documentation on 05/24/21 for the exam, diagnosis, procedures, and orders are all accurate and complete.   IF YOU HAVE BEEN REFERRED TO A SPECIALIST, IT MAY TAKE 1-2 WEEKS TO SCHEDULE/PROCESS THE REFERRAL. IF YOU HAVE NOT HEARD FROM US/SPECIALIST IN TWO WEEKS, PLEASE GIVE [place] A CALL AT [phone] X [phone].   THE PATIENT IS ENCOURAGED TO PRACTICE SOCIAL DISTANCING DUE TO THE  COVID-19 PANDEMIC.

## 2021-05-24 NOTE — Patient Instructions (Signed)
Breast Tenderness Breast tenderness is a common problem for women of all ages, but may also occur in men. Breast tenderness may range from mild discomfort to severe pain. In women, the pain usually comes and goes with the menstrual cycle, but it can also be constant. Breast tenderness has many possible causes, including hormone changes, infections, and taking certain medicines. You may have tests, such as a mammogram or an ultrasound, to check for any unusual findings. Having breast tenderness usually does not mean that you have breast cancer. Follow these instructions at home: Managing pain and discomfort  If directed, put ice to the painful area. To do this: Put ice in a plastic bag. Place a towel between your skin and the bag. Leave the ice on for 20 minutes, 2-3 times a day. Wear a supportive bra, especially during exercise. You may also want to wear a supportive bra while sleeping if your breasts are very tender. Medicines Take over-the-counter and prescription medicines only as told by your health care provider. If the cause of your pain is infection, you may be prescribed an antibiotic medicine. If you were prescribed an antibiotic, take it as told by your health care provider. Do not stop taking the antibiotic even if you start to feel better. Eating and drinking Your health care provider may recommend that you lessen the amount of fat in your diet. You can do this by: Limiting fried foods. Cooking foods using methods such as baking, boiling, grilling, and broiling. Decrease the amount of caffeine in your diet. Instead, drink more water and choose caffeine-free drinks. General instructions  Keep a log of the days and times when your breasts are most tender. Ask your health care provider how to do breast exams at home. This will help you notice if you have an unusual growth or lump. Keep all follow-up visits as told by your health care provider. This is important. Contact a health care  provider if: Any part of your breast is hard, red, and hot to the touch. This may be a sign of infection. You are a woman and: Not breastfeeding and you have fluid, especially blood or pus, coming out of your nipples. Have a new or painful lump in your breast that remains after your menstrual period ends. You have a fever. Your pain does not improve or it gets worse. Your pain is interfering with your daily activities. Summary Breast tenderness may range from mild discomfort to severe pain. Breast tenderness has many possible causes, including hormone changes, infections, and taking certain medicines. It can be treated with ice, wearing a supportive bra, and medicines. Make changes to your diet if told to by your health care provider. This information is not intended to replace advice given to you by your health care provider. Make sure you discuss any questions you have with your health care provider. Document Revised: 01/14/2019 Document Reviewed: 01/14/2019 Elsevier Patient Education  2022 Elsevier Inc.  

## 2021-05-25 LAB — CBC
Hematocrit: 30.2 % — ABNORMAL LOW (ref 34.0–46.6)
Hemoglobin: 9.6 g/dL — ABNORMAL LOW (ref 11.1–15.9)
MCH: 22.7 pg — ABNORMAL LOW (ref 26.6–33.0)
MCHC: 31.8 g/dL (ref 31.5–35.7)
MCV: 72 fL — ABNORMAL LOW (ref 79–97)
Platelets: 215 10*3/uL (ref 150–450)
RBC: 4.22 x10E6/uL (ref 3.77–5.28)
RDW: 14.7 % (ref 11.7–15.4)
WBC: 7.9 10*3/uL (ref 3.4–10.8)

## 2021-05-25 LAB — BRAIN NATRIURETIC PEPTIDE: BNP: 41.1 pg/mL (ref 0.0–100.0)

## 2021-05-25 LAB — HEMOGLOBIN A1C
Est. average glucose Bld gHb Est-mCnc: 114 mg/dL
Hgb A1c MFr Bld: 5.6 % (ref 4.8–5.6)

## 2021-05-25 LAB — TSH: TSH: <0.005 u[IU]/mL — ABNORMAL LOW (ref 0.450–4.500)

## 2021-05-25 LAB — VITAMIN B12: Vitamin B-12: 592 pg/mL (ref 232–1245)

## 2021-06-04 ENCOUNTER — Other Ambulatory Visit: Payer: Self-pay | Admitting: Nurse Practitioner

## 2021-06-04 DIAGNOSIS — N644 Mastodynia: Secondary | ICD-10-CM

## 2021-06-05 LAB — SPECIMEN STATUS REPORT

## 2021-06-05 LAB — IRON AND TIBC
Iron Saturation: 15 % (ref 15–55)
Iron: 39 ug/dL (ref 27–159)
Total Iron Binding Capacity: 262 ug/dL (ref 250–450)
UIBC: 223 ug/dL (ref 131–425)

## 2021-06-05 LAB — FERRITIN: Ferritin: 446 ng/mL — ABNORMAL HIGH (ref 15–150)

## 2021-06-05 LAB — T3, FREE: T3, Free: 9.1 pg/mL — ABNORMAL HIGH (ref 2.0–4.4)

## 2021-06-05 LAB — T4, FREE: Free T4: 3.34 ng/dL — ABNORMAL HIGH (ref 0.82–1.77)

## 2021-06-07 ENCOUNTER — Other Ambulatory Visit: Payer: Self-pay | Admitting: Nurse Practitioner

## 2021-06-07 DIAGNOSIS — R002 Palpitations: Secondary | ICD-10-CM

## 2021-06-07 DIAGNOSIS — R634 Abnormal weight loss: Secondary | ICD-10-CM

## 2021-06-07 DIAGNOSIS — R946 Abnormal results of thyroid function studies: Secondary | ICD-10-CM

## 2021-06-07 MED ORDER — METOPROLOL TARTRATE 25 MG PO TABS: 25.0000 mg | ORAL_TABLET | Freq: Two times a day (BID) | ORAL | 1 refills | Status: DC

## 2021-06-07 NOTE — Progress Notes (Signed)
I have sent metoprolol 25 mg take 2 times a day until her appt with Cardiology.

## 2021-06-10 ENCOUNTER — Ambulatory Visit (HOSPITAL_COMMUNITY): Payer: BC Managed Care – PPO

## 2021-06-16 ENCOUNTER — Other Ambulatory Visit: Payer: Self-pay

## 2021-06-16 ENCOUNTER — Ambulatory Visit (HOSPITAL_COMMUNITY): Payer: BC Managed Care – PPO | Attending: Cardiology

## 2021-06-16 DIAGNOSIS — R0609 Other forms of dyspnea: Secondary | ICD-10-CM | POA: Diagnosis present

## 2021-06-16 LAB — ECHOCARDIOGRAM COMPLETE
Area-P 1/2: 3.01 cm2
S' Lateral: 2.2 cm

## 2021-06-21 ENCOUNTER — Other Ambulatory Visit (HOSPITAL_COMMUNITY): Payer: BC Managed Care – PPO

## 2021-06-24 ENCOUNTER — Other Ambulatory Visit (HOSPITAL_COMMUNITY): Payer: BC Managed Care – PPO

## 2021-06-25 ENCOUNTER — Ambulatory Visit: Payer: BC Managed Care – PPO

## 2021-06-25 ENCOUNTER — Other Ambulatory Visit: Payer: Self-pay

## 2021-06-25 ENCOUNTER — Ambulatory Visit
Admission: RE | Admit: 2021-06-25 | Discharge: 2021-06-25 | Disposition: A | Discharge status: Home / Self Care | Payer: BC Managed Care – PPO | Source: Ambulatory Visit | Attending: Nurse Practitioner | Admitting: Nurse Practitioner

## 2021-06-25 ENCOUNTER — Other Ambulatory Visit (HOSPITAL_COMMUNITY): Payer: BC Managed Care – PPO

## 2021-06-25 DIAGNOSIS — N644 Mastodynia: Secondary | ICD-10-CM

## 2021-08-10 ENCOUNTER — Telehealth: Payer: BC Managed Care – PPO | Admitting: Nurse Practitioner

## 2021-08-19 ENCOUNTER — Ambulatory Visit: Payer: 59 | Admitting: Nurse Practitioner

## 2021-08-24 ENCOUNTER — Ambulatory Visit (INDEPENDENT_AMBULATORY_CARE_PROVIDER_SITE_OTHER): Payer: BC Managed Care – PPO | Admitting: Nurse Practitioner

## 2021-08-24 ENCOUNTER — Encounter: Payer: Self-pay | Admitting: Nurse Practitioner

## 2021-08-24 ENCOUNTER — Other Ambulatory Visit: Payer: Self-pay

## 2021-08-24 VITALS — BP 126/60 | HR 105 | Temp 98.5°F | Ht 65.0 in | Wt 130.8 lb

## 2021-08-24 DIAGNOSIS — R946 Abnormal results of thyroid function studies: Secondary | ICD-10-CM | POA: Diagnosis not present

## 2021-08-24 DIAGNOSIS — J029 Acute pharyngitis, unspecified: Secondary | ICD-10-CM | POA: Diagnosis not present

## 2021-08-24 DIAGNOSIS — I10 Essential (primary) hypertension: Secondary | ICD-10-CM | POA: Diagnosis not present

## 2021-08-24 DIAGNOSIS — E7800 Pure hypercholesterolemia, unspecified: Secondary | ICD-10-CM

## 2021-08-24 DIAGNOSIS — Z872 Personal history of diseases of the skin and subcutaneous tissue: Secondary | ICD-10-CM

## 2021-08-24 MED ORDER — LISINOPRIL 10 MG PO TABS: 10.0000 mg | ORAL_TABLET | Freq: Every day | ORAL | 1 refills | Status: DC

## 2021-08-24 MED ORDER — FLUOCINONIDE EMULSIFIED BASE 0.05 % EX CREA: 1.0000 "application " | TOPICAL_CREAM | Freq: Three times a day (TID) | CUTANEOUS | 1 refills | Status: DC

## 2021-08-24 NOTE — Patient Instructions (Signed)

## 2021-08-24 NOTE — Progress Notes (Signed)
I,Tianna Badgett,acting as a Education administrator for [employer], FNP.,have documented all relevant documentation on the behalf of Minette Brine, FNP,as directed by  Minette Brine, FNP while in the presence of Minette Brine, [place].  This visit occurred during the SARS-CoV-2 public health emergency.  Safety protocols were in place, including screening questions prior to the visit, additional usage of staff PPE, and extensive cleaning of exam room while observing appropriate contact time as indicated for disinfecting solutions.  Subjective:     Patient ID: Aimee Austin , female    DOB: 29-Oct-1960 , 60 y.o.   MRN: [id number]   Chief Complaint  Patient presents with   Hypertension    HPI  She presents today for her 4 month HTN and cholesterol follow up. She was having a burning sensation to the back of her throat since December 11th.   Hypertension This is a chronic problem. The current episode started more than 1 year ago. The problem is unchanged. The problem is controlled. Pertinent negatives include no chest pain, palpitations or shortness of breath. There are no associated agents to hypertension. Risk factors for coronary artery disease include dyslipidemia. Past treatments include calcium channel blockers and central alpha agonists. The current treatment provides moderate improvement. There is no history of chronic renal disease.  Hyperlipidemia This is a chronic problem. The current episode started more than 1 year ago. The problem is uncontrolled. She has no history of chronic renal disease. There are no known factors aggravating her hyperlipidemia. Pertinent negatives include no chest pain or shortness of breath. There are no compliance problems.  Risk factors for coronary artery disease include dyslipidemia.    Past Medical History:  Diagnosis Date   Anemia    Arthritis    Rheumatoid Arthritis 2010   Asthma    Eczema    Renal disorder    Strep throat      Family History  Adopted: Yes   Problem Relation Age of Onset   Cancer Mother      Current Outpatient Medications:    albuterol (VENTOLIN HFA) 108 (90 Base) MCG/ACT inhaler, Inhale 2 puffs into the lungs every 6 (six) hours as needed for wheezing or shortness of breath., Disp: 8 g, Rfl: 2   Ferrous Sulfate (IRON PO), Take by mouth daily., Disp: , Rfl:    hydrOXYzine (ATARAX/VISTARIL) 25 MG tablet, Take 1 tablet (25 mg total) by mouth as needed., Disp: 30 tablet, Rfl: 3   Nutritional Supplements (VITAMIN D BOOSTER PO), Take 1 tablet by mouth daily., Disp: , Rfl:    fluocinonide-emollient (LIDEX-E) 0.05 % cream, Apply 1 application topically 3 (three) times daily., Disp: 60 g, Rfl: 1   lisinopril (ZESTRIL) 10 MG tablet, Take 1 tablet (10 mg total) by mouth daily., Disp: 90 tablet, Rfl: 1   methimazole (TAPAZOLE) 5 MG tablet, Take 1 tablet (5 mg total) by mouth 2 (two) times daily., Disp: 180 tablet, Rfl: 1   Allergies  Allergen Reactions   Shellfish Allergy Anaphylaxis   Sulfa Antibiotics Anaphylaxis     Review of Systems  Constitutional: Negative.  Negative for fatigue.  HENT:  Positive for sore throat.        Reports having brown "spots" to the back of her throat unable to brush off.   Respiratory: Negative.  Negative for shortness of breath.   Cardiovascular: Negative.  Negative for chest pain and palpitations.  Gastrointestinal: Negative.   Skin:  Positive for color change.  Neurological: Negative.     Today's  Vitals   08/24/21 1421  BP: 126/60  Pulse: (!) 105  Temp: 98.5 F (36.9 C)  TempSrc: Oral  Weight: 130 lb 12.8 oz (59.3 kg)  Height: _0  (1.651 m)   Body mass index is 21.77 kg/m.  Wt Readings from Last 3 Encounters:  08/26/21 131 lb 12.8 oz (59.8 kg)  08/24/21 130 lb 12.8 oz (59.3 kg)  05/24/21 127 lb 3.2 oz (57.7 kg)    Objective:  Physical Exam Vitals reviewed.  Constitutional:      General: She is not in acute distress.    Appearance: Normal appearance. She is well-developed.   HENT:     Head: Normocephalic and atraumatic.     Right Ear: Hearing normal.     Left Ear: Hearing normal.     Mouth/Throat:     Mouth: Mucous membranes are moist.     Pharynx: No oropharyngeal exudate or posterior oropharyngeal erythema.  Eyes:     General: Lids are normal.     Pupils: Pupils are equal, round, and reactive to light.     Funduscopic exam:    Right eye: No papilledema.        Left eye: No papilledema.  Neck:     Thyroid: No thyroid mass.     Vascular: No carotid bruit.  Cardiovascular:     Rate and Rhythm: Normal rate and regular rhythm.     Pulses: Normal pulses.     Heart sounds: Normal heart sounds. No murmur heard. Pulmonary:     Effort: Pulmonary effort is normal. No respiratory distress.     Breath sounds: Normal breath sounds. No wheezing.  Chest:  Breasts:    Right: Normal. No mass or tenderness.     Left: Normal. No mass or tenderness.  Musculoskeletal:     Cervical back: Full passive range of motion without pain.  Skin:    General: Skin is warm and dry.     Coloration: Skin is not jaundiced.     Findings: No bruising.  Neurological:     General: No focal deficit present.     Mental Status: She is alert and oriented to person, place, and time.     Cranial Nerves: No cranial nerve deficit.     Sensory: No sensory deficit.     Motor: No weakness.  Psychiatric:        Mood and Affect: Mood normal.        Behavior: Behavior normal.        Thought Content: Thought content normal.        Judgment: Judgment normal.        Assessment And Plan:     1. Sore throat Comments: Rapid strep is negative. Encouraged to do warm water gargles and hot tea with honey - POCT rapid strep A  2. Essential hypertension, benign Comments: Blood pressure is well controlled - lisinopril (ZESTRIL) 10 MG tablet; Take 1 tablet (10 mg total) by mouth daily.  Dispense: 90 tablet; Refill: 1 - CMP14+EGFR  3. Elevated cholesterol Comments: Stable, diet controlled.  Encouraged to eat a low fat diet.  - Lipid panel  4. Abnormal thyroid function test Comments: Will check thyroid ultrasound. She is awaiting her appt with Endocrinology - US THYROID; Future - T4 - T3, free - TSH  5. History of eczema     Patient was given opportunity to ask questions. Patient verbalized understanding of the plan and was able to repeat key elements of the plan. All questions were  answered to their satisfaction.  Minette Brine, FNP   I, Minette Brine, FNP, have reviewed all documentation for this visit. The documentation on 08/24/21 for the exam, diagnosis, procedures, and orders are all accurate and complete.   IF YOU HAVE BEEN REFERRED TO A SPECIALIST, IT MAY TAKE 1-2 WEEKS TO SCHEDULE/PROCESS THE REFERRAL. IF YOU HAVE NOT HEARD FROM US/SPECIALIST IN TWO WEEKS, PLEASE GIVE [place] A CALL AT [phone].   THE PATIENT IS ENCOURAGED TO PRACTICE SOCIAL DISTANCING DUE TO THE COVID-19 PANDEMIC.

## 2021-08-25 LAB — CMP14+EGFR
ALT: 12 IU/L (ref 0–32)
AST: 20 IU/L (ref 0–40)
Albumin/Globulin Ratio: 1.7 (ref 1.2–2.2)
Albumin: 4.5 g/dL (ref 3.8–4.9)
Alkaline Phosphatase: 103 IU/L (ref 44–121)
BUN/Creatinine Ratio: 20 (ref 9–23)
BUN: 25 mg/dL — ABNORMAL HIGH (ref 6–24)
Bilirubin Total: <0.2 mg/dL (ref 0.0–1.2)
CO2: 21 mmol/L (ref 20–29)
Calcium: 9.6 mg/dL (ref 8.7–10.2)
Chloride: 107 mmol/L — ABNORMAL HIGH (ref 96–106)
Creatinine, Ser: 1.26 mg/dL — ABNORMAL HIGH (ref 0.57–1.00)
Globulin, Total: 2.6 g/dL (ref 1.5–4.5)
Glucose: 88 mg/dL (ref 70–99)
Potassium: 5.3 mmol/L — ABNORMAL HIGH (ref 3.5–5.2)
Sodium: 142 mmol/L (ref 134–144)
Total Protein: 7.1 g/dL (ref 6.0–8.5)
eGFR: 49 mL/min/{1.73_m2} — ABNORMAL LOW (ref >59)

## 2021-08-25 LAB — LIPID PANEL
Chol/HDL Ratio: 6.5 ratio — ABNORMAL HIGH (ref 0.0–4.4)
Cholesterol, Total: 216 mg/dL — ABNORMAL HIGH (ref 100–199)
HDL: 33 mg/dL — ABNORMAL LOW (ref >39)
LDL Chol Calc (NIH): 120 mg/dL — ABNORMAL HIGH (ref 0–99)
Triglycerides: 357 mg/dL — ABNORMAL HIGH (ref 0–149)
VLDL Cholesterol Cal: 63 mg/dL — ABNORMAL HIGH (ref 5–40)

## 2021-08-25 LAB — T3, FREE: T3, Free: 5 pg/mL — ABNORMAL HIGH (ref 2.0–4.4)

## 2021-08-25 LAB — TSH: TSH: <0.005 u[IU]/mL — ABNORMAL LOW (ref 0.450–4.500)

## 2021-08-25 LAB — T4: T4, Total: 10.7 ug/dL (ref 4.5–12.0)

## 2021-08-26 ENCOUNTER — Encounter: Payer: Self-pay | Admitting: Internal Medicine

## 2021-08-26 ENCOUNTER — Ambulatory Visit: Payer: BC Managed Care – PPO | Admitting: Internal Medicine

## 2021-08-26 ENCOUNTER — Other Ambulatory Visit: Payer: Self-pay

## 2021-08-26 VITALS — BP 126/82 | HR 107 | Ht 65.0 in | Wt 131.8 lb

## 2021-08-26 DIAGNOSIS — E042 Nontoxic multinodular goiter: Secondary | ICD-10-CM | POA: Diagnosis not present

## 2021-08-26 DIAGNOSIS — E0590 Thyrotoxicosis, unspecified without thyrotoxic crisis or storm: Secondary | ICD-10-CM | POA: Insufficient documentation

## 2021-08-26 MED ORDER — METHIMAZOLE 5 MG PO TABS
5.0000 mg | ORAL_TABLET | Freq: Two times a day (BID) | ORAL | 1 refills | Status: DC
Start: 2021-08-26 — End: 2021-10-06

## 2021-08-26 NOTE — Progress Notes (Signed)
° ° °Name: Aimee Austin  °MRN/ DOB: [id number], 07/17/1961    °Age/ Sex: 60 y.o., female   ° °PCP: Moore, Janece, FNP   °Reason for Endocrinology Evaluation: Hyperthyroidism   °   °Date of Initial Endocrinology Evaluation: 08/26/2021   ° ° °HPI: °Aimee Austin is a 60 y.o. female with a past medical history of HTN and dyslipidemia. The patient presented for initial endocrinology clinic visit on 08/26/2021 for consultative assistance with her Hyperthyroidism.  ° °Patient has been diagnosed with hyperthyroidism on 08/24/2021 when she was noted to have a suppressed TSH at 0.005 you IU/mL and elevated free T3 at 5 pg/mL with normal total T4 at 10.7 UG/DL. 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 She is S/P FNA of the right mid pole and left inferior nodule with benign cytology in 07/2016  ° ° °She has been noted with weight loss since 11/2020  °Had COVID infection in 04/2021 °She is very tired  °Denies loose stool or diarrhea per se, but has soft pellets  °Has noted palpitations  °Denies tremors  ° °She has a right throat discomfort as if there's popcorn stuck in her throat  ° °She on MVI but no Biotin  ° °No Fh of thyroid disease  ° ° ° ° ° °HISTORY:  °Past Medical History:  °Past Medical History:  °Diagnosis Date  ° Anemia   ° Arthritis   ° Rheumatoid Arthritis 2010  ° Asthma   ° Eczema   ° Renal disorder   ° Strep throat   ° °Past Surgical History:  °Past Surgical History:  °Procedure Laterality Date  ° ABDOMINAL HYSTERECTOMY  03/1998  ° Uterus only  °  °Social History:  reports that she has never smoked. She has never used smokeless tobacco. She reports that she does not drink alcohol and does not use drugs. °Family History: family history includes Cancer in her mother. She was adopted. ° ° °HOME MEDICATIONS: °Allergies as of 08/26/2021   ° °   Reactions  ° Shellfish Allergy Anaphylaxis  ° Sulfa Antibiotics Anaphylaxis  ° °  ° °  °Medication List  °  ° °  ° Accurate  as of August 26, 2021  3:19 PM. If you have any questions, ask your nurse or doctor.  °  °  ° °  ° °albuterol 108 (90 Base) MCG/ACT inhaler °Commonly known as: VENTOLIN HFA °Inhale 2 puffs into the lungs every 6 (six) hours as needed for wheezing or shortness of breath. °  °fluocinonide-emollient 0.05 % cream °Commonly known as: LIDEX-E °Apply 1 application topically 3 (three) times daily. °  °hydrOXYzine 25 MG tablet °Commonly known as: ATARAX °Take 1 tablet (25 mg total) by mouth as needed. °  °IRON PO °Take by mouth daily. °  °lisinopril 10 MG tablet °Commonly known as: ZESTRIL °Take 1 tablet (10 mg total) by mouth daily. °  °methimazole 5 MG tablet °Commonly known as: TAPAZOLE °Take 1 tablet (5 mg total) by mouth 2 (two) times daily. °Started by: Ibtehal J Shamleffer, MD °  °VITAMIN D BOOSTER PO °Take 1 tablet by mouth daily. °  ° °  °  ° ° °REVIEW OF SYSTEMS: °A comprehensive ROS was conducted with the patient and is negative except as per HPI  ° ° °OBJECTIVE:  °VS: BP 126/82 (BP Location: Left Arm, Patient Position: Sitting, Cuff Size: Small)    Pulse (!) 107      Ht 5' 5" (1.651 m)    Wt 131 lb 12.8 oz (59.8 kg)    SpO2 99%    BMI 21.93 kg/m²   ° °Wt Readings from Last 3 Encounters:  °08/26/21 131 lb 12.8 oz (59.8 kg)  °08/24/21 130 lb 12.8 oz (59.3 kg)  °05/24/21 127 lb 3.2 oz (57.7 kg)  ° ° ° °EXAM: °General: Pt appears well and is in NAD  °Neck: General: Supple without adenopathy. °Thyroid: Left thyroid nodule appreciated, no nodules on the right appreciated on today's exam   °Lungs: Clear with good BS bilat with no rales, rhonchi, or wheezes  °Heart: Auscultation: RRR.  °Abdomen: Normoactive bowel sounds, soft, nontender, without masses or organomegaly palpable  °Extremities:  °BL LE: No pretibial edema normal ROM and strength.  °Skin: Hair: Texture and amount normal with gender appropriate distribution °Skin Inspection: No rashes °Skin Palpation: Skin temperature, texture, and thickness normal to  palpation  °Mental Status: Judgment, insight: Intact °Orientation: Oriented to time, place, and person °Mood and affect: No depression, anxiety, or agitation  ° ° ° °DATA REVIEWED: ° ° Latest Reference Range & Units 08/24/21 15:02  °TSH 0.450 - 4.500 uIU/mL <0.005 (L)  °Triiodothyronine,Free,Serum 2.0 - 4.4 pg/mL 5.0 (H)  °Thyroxine (T4) 4.5 - 12.0 ug/dL 10.7  ° ° ° ° Latest Reference Range & Units 08/24/21 15:02  °Sodium 134 - 144 mmol/L 142  °Potassium 3.5 - 5.2 mmol/L 5.3 (H)  °Chloride 96 - 106 mmol/L 107 (H)  °CO2 20 - 29 mmol/L 21  °Glucose 70 - 99 mg/dL 88  °BUN 6 - 24 mg/dL 25 (H)  °Creatinine 0.57 - 1.00 mg/dL 1.26 (H)  °Calcium 8.7 - 10.2 mg/dL 9.6  °BUN/Creatinine Ratio 9 - 23  20  °eGFR >59 mL/min/1.73 49 (L)  °Alkaline Phosphatase 44 - 121 IU/L 103  °Albumin 3.8 - 4.9 g/dL 4.5  °Albumin/Globulin Ratio 1.2 - 2.2  1.7  °AST 0 - 40 IU/L 20  °ALT 0 - 32 IU/L 12  °Total Protein 6.0 - 8.5 g/dL 7.1  °Total Bilirubin 0.0 - 1.2 mg/dL <0.2  ° °FNA right mid nodule 07/15/2016 ° °Benign cytology  ° ° °FNA left inferior nodule 07/15/2016 ° °Benign cytolofy  ° °ASSESSMENT/PLAN/RECOMMENDATIONS:  ° °Hyperthyroidism: ° °- 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  ° ° °We discussed with pt the benefits of methimazole in the Tx of hyperthyroidism, as well as the possible side effects/complications of anti-thyroid drug Tx (specifically detailing the rare, but serious side effect of agranulocytosis). She was informed of need for regular thyroid function monitoring while on methimazole to ensure appropriate dosage without over-treatment. As well, we discussed the possible side effects of methimazole including the chance of rash, the small chance of liver irritation/juandice and the <=1 in 300-400 chance of sudden onset agranulocytosis.  We discussed importance of going to  ED promptly (and stopping methimazole) if shewere to develop significant fever with severe sore throat of other evidence of acute infection.    °We extensively discussed the various treatment options for hyperthyroidism and Graves disease including ablation therapy with radioactive iodine versus antithyroid drug treatment versus surgical therapy.  We recommended to the patient that we felt, at this time, that thionamide  therapy would be most optimal.  We discussed the various possible benefits versus side effects of the various therapies.  ° °  I carefully explained to the patient that one of the consequences of I-131 ablation treatment would likely be permanent hypothyroidism which would require long-term replacement therapy with LT4. ° ° °Medications : °Start Methimazole 5 mg BID  ° ° °2. MNG: ° °- No local neck symptoms °- She has a pending Ultrasound on 12/28th  °- She is S/P benign FNA of the right and left thyroid nodules in 2017 °- Per pt she was under the impression this was done due to laryngitis at the time ? ° ° °F/U in 3 months  °Labs in 6 weeks  ° °Signed electronically by: °Abby Jaralla Shamleffer, MD ° °[facility]  °[facility] °[address] °[place], [place] [address] °Phone: [phone] °FAX: [phone] ° ° °CC: °Moore, Janece, FNP °[address] °[place] [address] °Phone: [phone] °Fax: [phone] ° ° °Return to Endocrinology clinic as below: °Future Appointments  °Date Time Provider Department Center  °11/17/2021  8:20 AM Moore, Janece, FNP [facility] None  °  ° ° ° ° ° °

## 2021-08-26 NOTE — Patient Instructions (Signed)
We recommend that you follow these hyperthyroidism instructions at home:  1) Take Methimazole 5 mg twice  a day If you develop severe sore throat with high fevers OR develop unexplained yellowing of your skin, eyes, under your tongue, severe abdominal pain with nausea or vomiting --> then please get evaluated immediately.   2) Get repeat thyroid labs 6 weeks .  It is ESSENTIAL to get follow-up labs to help avoid over or undertreatment of your hyperthyroidism - both of which can be dangerous to your health.

## 2021-08-31 ENCOUNTER — Other Ambulatory Visit: Payer: Self-pay

## 2021-08-31 DIAGNOSIS — Z872 Personal history of diseases of the skin and subcutaneous tissue: Secondary | ICD-10-CM

## 2021-08-31 MED ORDER — FLUOCINONIDE EMULSIFIED BASE 0.05 % EX CREA: 1.0000 "application " | TOPICAL_CREAM | Freq: Three times a day (TID) | CUTANEOUS | 1 refills | Status: DC

## 2021-09-01 ENCOUNTER — Other Ambulatory Visit: Payer: Self-pay

## 2021-09-01 ENCOUNTER — Ambulatory Visit
Admission: RE | Admit: 2021-09-01 | Discharge: 2021-09-01 | Disposition: A | Discharge status: Home / Self Care | Payer: BC Managed Care – PPO | Source: Ambulatory Visit | Attending: Nurse Practitioner | Admitting: Nurse Practitioner

## 2021-09-01 DIAGNOSIS — R946 Abnormal results of thyroid function studies: Secondary | ICD-10-CM

## 2021-09-26 LAB — POCT RAPID STREP A (OFFICE): Rapid Strep A Screen: NEGATIVE

## 2021-10-05 ENCOUNTER — Emergency Department (HOSPITAL_COMMUNITY): Payer: BC Managed Care – PPO

## 2021-10-05 ENCOUNTER — Encounter (HOSPITAL_BASED_OUTPATIENT_CLINIC_OR_DEPARTMENT_OTHER): Payer: Self-pay | Admitting: Urology

## 2021-10-05 ENCOUNTER — Other Ambulatory Visit: Payer: Self-pay

## 2021-10-05 ENCOUNTER — Emergency Department (HOSPITAL_BASED_OUTPATIENT_CLINIC_OR_DEPARTMENT_OTHER)
Admission: EM | Admit: 2021-10-05 | Discharge: 2021-10-05 | Disposition: A | Discharge status: Home / Self Care | Payer: BC Managed Care – PPO | Attending: Emergency Medicine | Admitting: Emergency Medicine

## 2021-10-05 ENCOUNTER — Emergency Department (HOSPITAL_BASED_OUTPATIENT_CLINIC_OR_DEPARTMENT_OTHER): Payer: BC Managed Care – PPO

## 2021-10-05 DIAGNOSIS — Z20822 Contact with and (suspected) exposure to covid-19: Secondary | ICD-10-CM | POA: Diagnosis not present

## 2021-10-05 DIAGNOSIS — E0590 Thyrotoxicosis, unspecified without thyrotoxic crisis or storm: Secondary | ICD-10-CM

## 2021-10-05 DIAGNOSIS — I639 Cerebral infarction, unspecified: Secondary | ICD-10-CM | POA: Insufficient documentation

## 2021-10-05 DIAGNOSIS — M542 Cervicalgia: Secondary | ICD-10-CM | POA: Insufficient documentation

## 2021-10-05 DIAGNOSIS — R2990 Unspecified symptoms and signs involving the nervous system: Secondary | ICD-10-CM

## 2021-10-05 LAB — DIFFERENTIAL
Abs Immature Granulocytes: 0.01 10*3/uL (ref 0.00–0.07)
Basophils Absolute: 0.1 10*3/uL (ref 0.0–0.1)
Basophils Relative: 1 %
Eosinophils Absolute: 0.2 10*3/uL (ref 0.0–0.5)
Eosinophils Relative: 3 %
Immature Granulocytes: 0 %
Lymphocytes Relative: 32 %
Lymphs Abs: 2.2 10*3/uL (ref 0.7–4.0)
Monocytes Absolute: 0.4 10*3/uL (ref 0.1–1.0)
Monocytes Relative: 6 %
Neutro Abs: 4 10*3/uL (ref 1.7–7.7)
Neutrophils Relative %: 58 %

## 2021-10-05 LAB — COMPREHENSIVE METABOLIC PANEL
ALT: 14 U/L (ref 0–44)
AST: 21 U/L (ref 15–41)
Albumin: 4.5 g/dL (ref 3.5–5.0)
Alkaline Phosphatase: 99 U/L (ref 38–126)
Anion gap: 12 (ref 5–15)
BUN: 27 mg/dL — ABNORMAL HIGH (ref 6–20)
CO2: 24 mmol/L (ref 22–32)
Calcium: 9.8 mg/dL (ref 8.9–10.3)
Chloride: 106 mmol/L (ref 98–111)
Creatinine, Ser: 1.48 mg/dL — ABNORMAL HIGH (ref 0.44–1.00)
GFR, Estimated: 41 mL/min — ABNORMAL LOW (ref >60)
Glucose, Bld: 84 mg/dL (ref 70–99)
Potassium: 4.2 mmol/L (ref 3.5–5.1)
Sodium: 142 mmol/L (ref 135–145)
Total Bilirubin: 0.3 mg/dL (ref 0.3–1.2)
Total Protein: 7.7 g/dL (ref 6.5–8.1)

## 2021-10-05 LAB — URINALYSIS, ROUTINE W REFLEX MICROSCOPIC
Bilirubin Urine: NEGATIVE
Glucose, UA: NEGATIVE mg/dL
Ketones, ur: NEGATIVE mg/dL
Leukocytes,Ua: NEGATIVE
Nitrite: NEGATIVE
Specific Gravity, Urine: 1.011 (ref 1.005–1.030)
pH: 5.5 (ref 5.0–8.0)

## 2021-10-05 LAB — CBC
HCT: 31.7 % — ABNORMAL LOW (ref 36.0–46.0)
Hemoglobin: 10.2 g/dL — ABNORMAL LOW (ref 12.0–15.0)
MCH: 23.7 pg — ABNORMAL LOW (ref 26.0–34.0)
MCHC: 32.2 g/dL (ref 30.0–36.0)
MCV: 73.5 fL — ABNORMAL LOW (ref 80.0–100.0)
Platelets: 205 10*3/uL (ref 150–400)
RBC: 4.31 MIL/uL (ref 3.87–5.11)
RDW: 13.8 % (ref 11.5–15.5)
WBC: 6.9 10*3/uL (ref 4.0–10.5)
nRBC: 0 % (ref 0.0–0.2)

## 2021-10-05 LAB — RAPID URINE DRUG SCREEN, HOSP PERFORMED
Amphetamines: NOT DETECTED
Barbiturates: NOT DETECTED
Benzodiazepines: NOT DETECTED
Cocaine: NOT DETECTED
Opiates: NOT DETECTED
Tetrahydrocannabinol: NOT DETECTED

## 2021-10-05 LAB — PROTIME-INR
INR: 1 (ref 0.8–1.2)
Prothrombin Time: 13 seconds (ref 11.4–15.2)

## 2021-10-05 LAB — RESP PANEL BY RT-PCR (FLU A&B, COVID) ARPGX2
Influenza A by PCR: NEGATIVE
Influenza B by PCR: NEGATIVE
SARS Coronavirus 2 by RT PCR: NEGATIVE

## 2021-10-05 LAB — TSH: TSH: <0.01 u[IU]/mL — ABNORMAL LOW (ref 0.350–4.500)

## 2021-10-05 LAB — T4, FREE: Free T4: 1.15 ng/dL — ABNORMAL HIGH (ref 0.61–1.12)

## 2021-10-05 LAB — TROPONIN I (HIGH SENSITIVITY)
Troponin I (High Sensitivity): 4 ng/L (ref <18)
Troponin I (High Sensitivity): 3 ng/L (ref <18)

## 2021-10-05 LAB — ETHANOL: Alcohol, Ethyl (B): <10 mg/dL (ref <10)

## 2021-10-05 LAB — APTT: aPTT: 28 seconds (ref 24–36)

## 2021-10-05 MED ORDER — IOHEXOL 350 MG/ML SOLN
60.0000 mL | Freq: Once | INTRAVENOUS | Status: AC | PRN
  Administered 2021-10-05: 60 mL via INTRAVENOUS

## 2021-10-05 NOTE — ED Triage Notes (Signed)
Pt tx in from [facility] via [facility] for an MRI r/t tingling and weakness to left arm and leg.  [facility] reports NIH of 0.

## 2021-10-05 NOTE — ED Notes (Signed)
Patient transported to MRI 

## 2021-10-05 NOTE — ED Provider Notes (Signed)
[facility] Provider Note   CSN: [id number] Arrival date & time: 10/05/21  1005     History  No chief complaint on file.   Aimee Austin is a 61 y.o. female.  Pt is a 61 yo female with hyperlipidemia presenting for stroke like symptoms. Pt admits to severe acute left sided neck pain with radiation to left arm with left arm weakness that developed yesterday upon waking. 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 States when she went back to review notes from the interview that her typing didn't make since and she had to rewrite her interview notes.   Risk factors: never smoker, no hx htn on lisinopril for renal function only,   The history is provided by the patient. No language interpreter was used.      Home Medications Prior to Admission medications   Medication Sig Start Date End Date Taking? Authorizing Provider  albuterol (VENTOLIN HFA) 108 (90 Base) MCG/ACT inhaler Inhale 2 puffs into the lungs every 6 (six) hours as needed for wheezing or shortness of breath. 04/22/21   Bary Castilla, NP  Ferrous Sulfate (IRON PO) Take by mouth daily.    [provider]  fluocinonide-emollient (LIDEX-E) 0.05 % cream Apply 1 application topically 3 (three) times daily. 08/31/21   Minette Brine, FNP  hydrOXYzine (ATARAX/VISTARIL) 25 MG tablet Take 1 tablet (25 mg total) by mouth as needed. 11/12/20   Ghumman, Ramandeep, NP  lisinopril (ZESTRIL) 10 MG tablet Take 1 tablet (10 mg total) by mouth daily. 08/24/21   Minette Brine, FNP  methimazole (TAPAZOLE) 5 MG tablet Take 1 tablet (5 mg total) by mouth 2 (two) times daily. 08/26/21   Shamleffer, Melanie Crazier, MD  Nutritional Supplements (VITAMIN D BOOSTER PO) Take 1 tablet by mouth daily.    [provider]      Allergies    Shellfish allergy and Sulfa antibiotics    Review of  Systems   Review of Systems  Constitutional:  Negative for chills and fever.  HENT:  Negative for ear pain and sore throat.   Eyes:  Negative for pain and visual disturbance.  Respiratory:  Negative for cough and shortness of breath.   Cardiovascular:  Negative for chest pain and palpitations.  Gastrointestinal:  Negative for abdominal pain and vomiting.  Genitourinary:  Negative for dysuria and hematuria.  Musculoskeletal:  Negative for arthralgias and back pain.  Skin:  Negative for color change and rash.  Neurological:  Positive for weakness. Negative for seizures and syncope.  Psychiatric/Behavioral:  Positive for confusion.   All other systems reviewed and are negative.  Physical Exam Updated Vital Signs BP (!) 143/69    Pulse 93    Temp 98.5 F (36.9 C)    Resp 18    Ht 5\' 5"  (1.651 m)    Wt 59.8 kg    SpO2 100%    BMI 21.94 kg/m  Physical Exam Vitals and nursing note reviewed.  Constitutional:      General: She is not in acute distress.    Appearance: She is well-developed.  HENT:     Head: Normocephalic and atraumatic.  Eyes:     General: Lids are normal. Vision grossly intact.     Conjunctiva/sclera: Conjunctivae normal.     Pupils: Pupils are equal, round, and reactive to light.  Cardiovascular:     Rate and  Rhythm: Normal rate and regular rhythm.     Heart sounds: No murmur heard. Pulmonary:     Effort: Pulmonary effort is normal. No respiratory distress.     Breath sounds: Normal breath sounds.  Abdominal:     Palpations: Abdomen is soft.     Tenderness: There is no abdominal tenderness.  Musculoskeletal:        General: No swelling.     Cervical back: Neck supple.  Skin:    General: Skin is warm and dry.     Capillary Refill: Capillary refill takes less than 2 seconds.  Neurological:     Mental Status: She is alert.     GCS: GCS eye subscore is 4. GCS verbal subscore is 5. GCS motor subscore is 6.     Cranial Nerves: Cranial nerves 2-12 are intact.      Sensory: Sensation is intact.     Motor: Motor function is intact.     Coordination: Coordination is intact.     Gait: Gait is intact.  Psychiatric:        Mood and Affect: Mood normal.    ED Results / Procedures / Treatments   Labs (all labs ordered are listed, but only abnormal results are displayed) Labs Reviewed - No data to display  EKG EKG Interpretation  Date/Time:  Tuesday October 05 2021 10:14:40 EST Ventricular Rate:  80 PR Interval:  174 QRS Duration: 90 QT Interval:  344 QTC Calculation: 396 R Axis:   84 Text Interpretation: Normal sinus rhythm Normal ECG Confirmed by Campbell Stall ([id number]) on 0/96/0454 10:28:15 AM  Radiology No results found.  Procedures Procedures    Medications Ordered in ED Medications - No data to display  ED Course/ Medical Decision Making/ A&P                           Medical Decision Making Amount and/or Complexity of Data Reviewed Labs: ordered. Radiology: ordered.  Risk Prescription drug management.   Pt is a 61 yo female with pmh of hyperlipidemia present for evaluation of stroke like symptoms that occurred yesterday including left facial sensation changes, left upper and lower extremity weakness.   -No Stroke Activation. Pt outside time window.  -Symptom onset: yesterday morning upon waking. -Current NIH Stroke Scale 0  Patient will not receive TPA for the following reason(s): Did not meet the time window for tPA and had resolution of symptoms. Thorough discussion had with patient and/or family/guardian on risks/benefits of tPA and why patient is not a candidate for thrombolytic therapy at this time. tPA will not be given to this patient. ASA will be administered if there are no contraindications or allergies.  CT head demonstrates no acute process. CTA head and neck demonstrates no acute process.  Recommend transfer for MRI. Pt agreeable to plan. I spoke with ER physician Dr. Karle Starch.          Final Clinical  Impression(s) / ED Diagnoses Final diagnoses:  Stroke-like symptoms    Rx / DC Orders ED Discharge Orders     None         Lianne Cure, DO 09/81/19 1521

## 2021-10-05 NOTE — ED Notes (Signed)
DC instructions reviewed with pt. Pt verbalized understanding.  PT DC.  

## 2021-10-05 NOTE — ED Provider Notes (Signed)
[facility] EMERGENCY DEPARTMENT Provider Note   CSN: [id number] Arrival date & time: 10/05/21  1005     History  No chief complaint on file.   Aimee Austin is a 61 y.o. female transferred here to [facility] from [facility] for strokelike symptoms.  Patient initially presented for left-sided neck pain with left arm weakness that developed yesterday upon waking.  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  Currently, her left arm and hand still feel weak and she does have some soreness although this is slightly improved.  She also endorses bilateral hand tremors that started yesterday and have not yet resolved.  She does know that she has recently diagnosed as hyperthyroid and has been on 5 mg methimazole for this.  CT had at [facility] was negative for acute intracranial processes, however patient was sent here for MRI.  HPI     Home Medications Prior to Admission medications   Medication Sig Start Date End Date Taking? Authorizing Provider  albuterol (VENTOLIN HFA) 108 (90 Base) MCG/ACT inhaler Inhale 2 puffs into the lungs every 6 (six) hours as needed for wheezing or shortness of breath. 04/22/21  Yes Ghumman, Ramandeep, NP  Ferrous Sulfate (IRON PO) Take by mouth daily.   Yes [provider]  fluocinonide-emollient (LIDEX-E) 0.05 % cream Apply 1 application topically 3 (three) times daily. 08/31/21  Yes Minette Brine, FNP  hydrOXYzine (ATARAX/VISTARIL) 25 MG tablet Take 1 tablet (25 mg total) by mouth as needed. 11/12/20  Yes Ghumman, Ramandeep, NP  lisinopril (ZESTRIL) 10 MG tablet Take 1 tablet (10 mg total) by mouth daily. 08/24/21  Yes Minette Brine, FNP  methimazole (TAPAZOLE) 5 MG tablet Take 1 tablet (5 mg total) by mouth 2 (two) times daily. 08/26/21  Yes Shamleffer, Melanie Crazier, MD  Nutritional Supplements (VITAMIN D BOOSTER  PO) Take 1 tablet by mouth daily.   Yes [provider]      Allergies    Shellfish allergy and Sulfa antibiotics    Review of Systems   Review of Systems  Constitutional:  Negative for fever.  HENT: Negative.    Eyes: Negative.   Respiratory:  Negative for shortness of breath.   Cardiovascular: Negative.   Gastrointestinal:  Negative for abdominal pain and vomiting.  Endocrine: Negative.   Genitourinary: Negative.   Musculoskeletal: Negative.   Skin:  Negative for rash.  Neurological:  Positive for tremors and weakness. Negative for headaches.  All other systems reviewed and are negative.  Physical Exam Updated Vital Signs BP (!) 142/73 (BP Location: Right Arm)    Pulse 89    Temp 98.3 F (36.8 C) (Oral)    Resp 16    Ht 5\' 5"  (1.651 m)    Wt 59.8 kg    SpO2 100%    BMI 21.94 kg/m  Physical Exam Vitals and nursing note reviewed.  Constitutional:      General: She is not in acute distress.    Appearance: She is not ill-appearing.  HENT:     Head: Atraumatic.  Eyes:     Conjunctiva/sclera: Conjunctivae normal.  Cardiovascular:     Rate and Rhythm: Normal rate and regular rhythm.     Pulses: Normal pulses.     Heart sounds: No murmur heard. Pulmonary:     Effort: Pulmonary effort is normal. No respiratory distress.  Breath sounds: Normal breath sounds.  Abdominal:     General: Abdomen is flat. There is no distension.     Palpations: Abdomen is soft.     Tenderness: There is no abdominal tenderness.  Musculoskeletal:        General: Normal range of motion.     Cervical back: Normal range of motion.  Skin:    General: Skin is warm and dry.     Capillary Refill: Capillary refill takes less than 2 seconds.  Neurological:     General: No focal deficit present.     Mental Status: She is alert.     Comments: Grip strength slightly diminished on left hand. Cranial nerves III through XII are intact.  Sensation is intact.  She is ambulatory.  5/5 strength of  dorsiflexion and plantar flexion bilaterally  Psychiatric:        Mood and Affect: Mood normal.    ED Results / Procedures / Treatments   Labs (all labs ordered are listed, but only abnormal results are displayed) Labs Reviewed  CBC - Abnormal; Notable for the following components:      Result Value   Hemoglobin 10.2 (*)    HCT 31.7 (*)    MCV 73.5 (*)    MCH 23.7 (*)    All other components within normal limits  COMPREHENSIVE METABOLIC PANEL - Abnormal; Notable for the following components:   BUN 27 (*)    Creatinine, Ser 1.48 (*)    GFR, Estimated 41 (*)    All other components within normal limits  URINALYSIS, ROUTINE W REFLEX MICROSCOPIC - Abnormal; Notable for the following components:   Color, Urine COLORLESS (*)    Hgb urine dipstick TRACE (*)    Protein, ur TRACE (*)    All other components within normal limits  TSH - Abnormal; Notable for the following components:   TSH <0.010 (*)    All other components within normal limits  RESP PANEL BY RT-PCR (FLU A&B, COVID) ARPGX2  ETHANOL  PROTIME-INR  APTT  DIFFERENTIAL  RAPID URINE DRUG SCREEN, HOSP PERFORMED  T4, FREE  T3  TROPONIN I (HIGH SENSITIVITY)  TROPONIN I (HIGH SENSITIVITY)    EKG EKG Interpretation  Date/Time:  Tuesday October 05 2021 10:14:40 EST Ventricular Rate:  80 PR Interval:  174 QRS Duration: 90 QT Interval:  344 QTC Calculation: 396 R Axis:   84 Text Interpretation: Normal sinus rhythm Normal ECG Confirmed by Campbell Stall ([id number]) on 6/96/2952 10:28:15 AM  Radiology CT ANGIO HEAD NECK W WO CM  Result Date: 10/05/2021 CLINICAL DATA:  TIA. Squeezing sensation in the left arm with pain radiating into the jaw as well as left leg pain. Tremors. EXAM: CT ANGIOGRAPHY HEAD AND NECK TECHNIQUE: Multidetector CT imaging of the head and neck was performed using the standard protocol during bolus administration of intravenous contrast. Multiplanar CT image reconstructions and MIPs were obtained to evaluate  the vascular anatomy. Carotid stenosis measurements (when applicable) are obtained utilizing NASCET criteria, using the distal internal carotid diameter as the denominator. RADIATION DOSE REDUCTION: This exam was performed according to the departmental dose-optimization program which includes automated exposure control, adjustment of the mA and/or kV according to patient size and/or use of iterative reconstruction technique. CONTRAST:  62mL OMNIPAQUE IOHEXOL 350 MG/ML SOLN COMPARISON:  None. FINDINGS: CT HEAD FINDINGS Brain: There is no evidence of an acute infarct, intracranial hemorrhage, mass, midline shift, or extra-axial fluid collection. The ventricles and sulci are normal. Vascular: No hyperdense vessel.  Skull: No fracture or suspicious osseous lesion. Sinuses: Paranasal sinuses and mastoid air cells are clear. Orbits: Unremarkable. Review of the MIP images confirms the above findings CTA NECK FINDINGS Aortic arch: Standard 3 vessel aortic arch with widely patent arch vessel origins. Right carotid system: Patent without evidence of stenosis or dissection. Minimal atherosclerotic plaque at the carotid bifurcation. Left carotid system: Patent without evidence of stenosis, dissection, or significant atherosclerosis. Vertebral arteries: Patent without evidence of stenosis, dissection, or significant atherosclerosis. Codominant. Skeleton: No acute osseous abnormality or suspicious osseous lesion. Mild cervical disc and facet degeneration. Other neck: Multinodular thyroid goiter with asymmetric enlargement of the left lobe which has been evaluated on previous imaging including a 09/01/2021 ultrasound. No evidence of cervical lymphadenopathy. Upper chest: Biapical pleuroparenchymal lung scarring. Review of the MIP images confirms the above findings CTA HEAD FINDINGS Anterior circulation: The internal carotid arteries are widely patent from skull base to carotid termini. ACAs and MCAs are patent without evidence of a  proximal branch occlusion or significant proximal stenosis. No aneurysm is identified. Posterior circulation: The intracranial vertebral arteries are widely patent to the basilar. The basilar artery is widely patent. Posterior communicating arteries are diminutive or absent. Both PCAs are patent without evidence of a significant proximal stenosis. No aneurysm is identified. Venous sinuses: Patent. Anatomic variants: None. Review of the MIP images confirms the above findings IMPRESSION: 1. Unremarkable CT appearance of the brain. No evidence of acute intracranial abnormality. 2. No large vessel occlusion, significant stenosis, or aneurysm in the head and neck. Electronically Signed   By: Logan Bores M.D.   On: 10/05/2021 14:37   MR BRAIN WO CONTRAST  Result Date: 10/05/2021 CLINICAL DATA:  TIA. Severe acute left-sided neck pain radiating into the left arm. Sudden onset of left upper extremity weakness. Head anise in the left face arm and leg. EXAM: MRI HEAD WITHOUT CONTRAST TECHNIQUE: Multiplanar, multiecho pulse sequences of the brain and surrounding structures were obtained without intravenous contrast. COMPARISON:  CTA head and neck 10/05/2021 FINDINGS: Brain: No acute infarct, hemorrhage, or mass lesion is present. A 10 mm ovoid T2 hyperdensity is present in the left parietal lobe on axial image 22 series 7. Mild periventricular T2 hyperintensities are slightly greater than expected for age. The ventricles are of normal size. No significant extraaxial fluid collection is present. The internal auditory canals are within normal limits. The brainstem and cerebellum are within normal limits. Vascular: Flow is present in the major intracranial arteries. Skull and upper cervical spine: The craniocervical junction is normal. Upper cervical spine is within normal limits. Marrow signal is unremarkable. Sinuses/Orbits: The paranasal sinuses and mastoid air cells are clear. The globes and orbits are within normal  limits. IMPRESSION: 1. No acute intracranial abnormality. 2. 10 mm ovoid T2 hyperintensities in the left parietal lobe white matter are slightly greater than expected for age. The finding is nonspecific but can be seen in the setting of chronic microvascular ischemia, a demyelinating process such as multiple sclerosis, vasculitis, complicated migraine headaches, or as the sequelae of a prior infectious or inflammatory process. Electronically Signed   By: San Morelle M.D.   On: 10/05/2021 18:30    Procedures Procedures   Medications Ordered in ED Medications  iohexol (OMNIPAQUE) 350 MG/ML injection 60 mL (60 mLs Intravenous Contrast Given 10/05/21 1409)    ED Course/ Medical Decision Making/ A&P  Medical Decision Making Amount and/or Complexity of Data Reviewed Labs: ordered. Radiology: ordered.  Risk Prescription drug management.   History:   Aimee Austin is a 61 y.o. female transferred here to [facility] from [facility] for strokelike symptoms.  Patient initially presented for left-sided neck pain with left arm weakness that developed yesterday upon waking.  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  Currently, her left arm and hand still feel weak and she does have some soreness although this is slightly improved.  She also endorses bilateral hand tremors that started yesterday and have not yet resolved.  She does know that she has recently diagnosed as hyperthyroid and has been on 5 mg methimazole for this.  CT had at [facility] was negative for acute intracranial processes, however patient was sent here for MRI.  External records from outside source obtained and reviewed including imaging results from [facility], and family medicine records regarding patient's recent hypothyroid diagnosis.  Patient has been on methimazole since October  2022  This patient presents to the ED for concern of unilateral weakness and tremors, this involves an extensive number of treatment options, and is a complaint that carries with it a high risk of complications and morbidity.   Differentials for this include stroke, hypothyroid, seizures, intracranial bleed, dehydration, drug intoxication, ethanol withdrawal  Initial impression:  Patient is pleasant, sitting on the gurney reading her book.  Nontoxic in no apparent distress.  Work-up done at [facility] is as follows: UA unremarkable CMP with elevated creatinine appears around her baseline CBC with mild anemia but this appears slightly improved from her baseline. UDS and respiratory panel negative Delta troponin normal, bleeding times normal TSH markedly low, I will obtain T3-T4 while here.  Lab Tests and EKG:  I Ordered, reviewed, and interpreted labs and EKG.  The pertinent results in my decision-making regarding them are detailed in the ED course and/or initial impression section above. T3/T4 pending at time of discharge   Imaging Studies ordered:  I ordered imaging studies including MRI brain which was negative for acute stroke or other pathology.  There was some hypodensities incidentally noted by the radiologist but I will have patient follow up with neurology regarding. I independently visualized and interpreted imaging and I agree with the radiologist interpretation. Decisions made regarding results are detailed in the ED course and/or initial impression section above.   Disposition:  After consideration of the diagnostic results, physical exam, history and the patients response to treatment feel that the patent would benefit from discharge with strict return precautions and outpatient follow-up.   Strokelike symptoms Hyperthyroid: Physical exam very reassuring.  TSH low, pending T3-T4.  Patient previously diagnosed with hyperthyroid.  She is to follow-up with her endocrinologist  about any medication changes needed considering her low TSH and bilateral hand tremors.  Negative for stroke today.  Believe her symptoms are actually more related to her overactive thyroid versus neurologic pathology.  Regardless, there were some incidental findings hypodensities consistent with nonspecific changes that warrants outpatient neurologic follow-up.  Patient had all questions asked and answered.  Return precautions discussed.  She was discharged home in good condition.   Final Clinical Impression(s) / ED Diagnoses Final diagnoses:  Stroke-like symptoms  Hyperthyroidism    Rx / DC Orders ED Discharge Orders     None  Tonye Pearson, [place] 10/05/21 1912    Drenda Freeze, MD 10/05/21 2116

## 2021-10-05 NOTE — Discharge Instructions (Addendum)
Your MRI today was negative for stroke, but there were some small nonspecific changes that could be indicative of some other process such as MS or previous infection.  I provided you with neurology follow-up so that they can go over this in more detail with you and potentially do further work-up.    We think your symptoms today were actually more related to your overactive thyroid versus a neurology pathology.  Additionally, the rest of your work-up was fairly reassuring, however we did see that your TSH was very low.  I know that you are currently on methimazole for hyperthyroid, but you may need an alteration in medication.  We collected TSH, T3 and T4 for you today-you may call your endocrinologist for further follow-up regarding this.

## 2021-10-05 NOTE — ED Triage Notes (Signed)
"  Feels like something is squeezing my left arm" since yesterday, radiation of pain into jaw States left leg pain as well State tremors started this morning, no h/o  NAD at this time A&O x 4

## 2021-10-06 ENCOUNTER — Other Ambulatory Visit: Payer: Self-pay | Admitting: Internal Medicine

## 2021-10-06 ENCOUNTER — Telehealth: Payer: Self-pay

## 2021-10-06 MED ORDER — METHIMAZOLE 5 MG PO TABS: 5.0000 mg | ORAL_TABLET | Freq: Three times a day (TID) | ORAL | 1 refills | Status: DC

## 2021-10-06 NOTE — Telephone Encounter (Signed)
Patient had labs done ED  yesterday and would like to know if she still needs to come in for a lab visit tomorrow.

## 2021-10-06 NOTE — Telephone Encounter (Signed)
Transition Care Management Follow-up Telephone Call Date of discharge and from where: 10/05/2021 [place]  How have you been since you were released from the hospital?  Pt states she still feels pain in neck & shoulders. Fatigue.  Any questions or concerns? No  Items Reviewed: Did the pt receive and understand the discharge instructions provided? Yes  Medications obtained and verified? Yes  Other? Yes  Any new allergies since your discharge? No  Dietary orders reviewed? Yes Do you have support at home? Yes   Home Care and Equipment/Supplies: Were home health services ordered? no If so, what is the name of the agency? N/a  Has the agency set up a time to come to the patient's home? not applicable Were any new equipment or medical supplies ordered?  No What is the name of the medical supply agency? N/a Were you able to get the supplies/equipment? not applicable Do you have any questions related to the use of the equipment or supplies? No  Functional Questionnaire: (I = Independent and D = Dependent) ADLs: i  Bathing/Dressing- i  Meal Prep- i  Eating- i  Maintaining continence- i  Transferring/Ambulation- i  Managing Meds- i  Follow up appointments reviewed:  PCP Hospital f/u appt confirmed? Yes  Scheduled to see Minette Brine  on 10/07/2021 @ [facility] OR virtual. [facility] f/u appt confirmed? No  Scheduled to see n/a on n/a @ n/a. Are transportation arrangements needed? No  If their condition worsens, is the pt aware to call PCP or go to the Emergency Dept.? Yes Was the patient provided with contact information for the PCP's office or ED? Yes Was to pt encouraged to call back with questions or concerns? Yes

## 2021-10-06 NOTE — Telephone Encounter (Signed)
Mychart message sent to patient as discuss. Also cancel appointment for tomorrow

## 2021-10-07 ENCOUNTER — Other Ambulatory Visit: Payer: BC Managed Care – PPO

## 2021-10-07 ENCOUNTER — Other Ambulatory Visit: Payer: Self-pay

## 2021-10-07 ENCOUNTER — Ambulatory Visit: Payer: BC Managed Care – PPO | Admitting: Nurse Practitioner

## 2021-10-07 LAB — T3: T3, Total: 113 ng/dL (ref 71–180)

## 2021-10-13 ENCOUNTER — Telehealth: Payer: Self-pay

## 2021-10-13 NOTE — Telephone Encounter (Signed)
VM left for patient to callback regarding medication and her questions. May also send through mychart.

## 2021-10-19 ENCOUNTER — Encounter: Payer: Self-pay | Admitting: Nurse Practitioner

## 2021-10-19 ENCOUNTER — Telehealth (INDEPENDENT_AMBULATORY_CARE_PROVIDER_SITE_OTHER): Payer: BC Managed Care – PPO | Admitting: Nurse Practitioner

## 2021-10-19 ENCOUNTER — Other Ambulatory Visit: Payer: Self-pay

## 2021-10-19 VITALS — Ht 65.0 in

## 2021-10-19 DIAGNOSIS — R519 Headache, unspecified: Secondary | ICD-10-CM

## 2021-10-19 DIAGNOSIS — R051 Acute cough: Secondary | ICD-10-CM | POA: Diagnosis not present

## 2021-10-19 DIAGNOSIS — R0989 Other specified symptoms and signs involving the circulatory and respiratory systems: Secondary | ICD-10-CM | POA: Diagnosis not present

## 2021-10-19 DIAGNOSIS — R3989 Other symptoms and signs involving the genitourinary system: Secondary | ICD-10-CM

## 2021-10-19 MED ORDER — FLUTICASONE PROPIONATE 50 MCG/ACT NA SUSP: 2.0000 | Freq: Every day | NASAL | 2 refills | Status: DC

## 2021-10-19 MED ORDER — GUAIFENESIN ER 600 MG PO TB12: 600.0000 mg | ORAL_TABLET | Freq: Two times a day (BID) | ORAL | 2 refills | Status: DC

## 2021-10-19 NOTE — Progress Notes (Signed)
Virtual Visit via telephone with failed video unable to hear patient   This visit type was conducted due to national recommendations for restrictions regarding the COVID-19 Pandemic (e.g. social distancing) in an effort to limit this patient's exposure and mitigate transmission in our community.  Due to her co-morbid illnesses, this patient is at least at moderate risk for complications without adequate follow up.  This format is felt to be most appropriate for this patient at this time.  All issues noted in this document were discussed and addressed.  A limited physical exam was performed with this format.    This visit type was conducted due to national recommendations for restrictions regarding the COVID-19 Pandemic (e.g. social distancing) in an effort to limit this patient's exposure and mitigate transmission in our community.  Patients identity confirmed using two different identifiers.  This format is felt to be most appropriate for this patient at this time.  All issues noted in this document were discussed and addressed.  No physical exam was performed (except for noted visual exam findings with Video Visits).    Date:  10/19/2021   ID:  Aimee Austin, DOB [birth year], MRN [id number]  Patient Location:  Work - spoke with Russ Halo  Provider location:   Office    Chief Complaint:  sore throat and chest congestion  History of Present Illness:    Aimee Austin is a 61 y.o. female who presents via video conferencing for a telehealth visit today.    The patient does have symptoms concerning for COVID-19 infection (fever, chills, cough, or new shortness of breath).   Pt presents today for sore throat & congestion on Sunday. She has left side sore throat. She has taken Ibuprofen (2 tabs) two times a day. She has a dry cough. She has been using a Vicks inhaler. Denies fever.  She also reports chest discomfort from congestion.  She has not taken a covid test. She does not have covid test  at home.   Pt states this initially started Sunday. She does have a headache as well. OTC medications taken, Ibuprofen.  She has taken a robitussin.     Past Medical History:  Diagnosis Date   Anemia    Arthritis    Rheumatoid Arthritis 2010   Asthma    Eczema    Renal disorder    Strep throat    Past Surgical History:  Procedure Laterality Date   ABDOMINAL HYSTERECTOMY  03/1998   Uterus only     Current Meds  Medication Sig   albuterol (VENTOLIN HFA) 108 (90 Base) MCG/ACT inhaler Inhale 2 puffs into the lungs every 6 (six) hours as needed for wheezing or shortness of breath.   Ferrous Sulfate (IRON PO) Take by mouth daily.   fluocinonide cream (LIDEX) 4.65 % Apply 1 application topically 2 (two) times daily.   fluocinonide-emollient (LIDEX-E) 0.05 % cream Apply 1 application topically 3 (three) times daily.   fluticasone (FLONASE) 50 MCG/ACT nasal spray Place 2 sprays into both nostrils daily.   guaiFENesin (MUCINEX) 600 MG 12 hr tablet Take 1 tablet (600 mg total) by mouth 2 (two) times daily.   hydrOXYzine (ATARAX/VISTARIL) 25 MG tablet Take 1 tablet (25 mg total) by mouth as needed.   lisinopril (ZESTRIL) 10 MG tablet Take 1 tablet (10 mg total) by mouth daily.   methimazole (TAPAZOLE) 5 MG tablet Take 1 tablet (5 mg total) by mouth 3 (three) times daily.   Nutritional Supplements (VITAMIN D BOOSTER  PO) Take 1 tablet by mouth daily.     Allergies:   Shellfish allergy and Sulfa antibiotics   Social History   Tobacco Use   Smoking status: Never   Smokeless tobacco: Never  Vaping Use   Vaping Use: Never used  Substance Use Topics   Alcohol use: No    Alcohol/week: 0.0 standard drinks   Drug use: No     Family Hx: The patient's family history includes Cancer in her mother. She was adopted.  ROS:   Please see the history of present illness.    Review of Systems  Constitutional: Negative.   HENT: Negative.    Respiratory: Negative.    Cardiovascular: Negative.    Gastrointestinal: Negative.   Genitourinary: Negative.   Endo/Heme/Allergies: Negative.   Psychiatric/Behavioral: Negative.     All other systems reviewed and are negative.   Labs/Other Tests and Data Reviewed:    Recent Labs: 02/17/2021: Magnesium 1.7 05/24/2021: BNP 41.1 10/05/2021: ALT 14; BUN 27; Creatinine, Ser 1.48; Hemoglobin 10.2; Platelets 205; Potassium 4.2; Sodium 142; TSH <0.010   Recent Lipid Panel Lab Results  Component Value Date/Time   CHOL 216 (H) 08/24/2021 03:02 PM   TRIG 357 (H) 08/24/2021 03:02 PM   HDL 33 (L) 08/24/2021 03:02 PM   CHOLHDL 6.5 (H) 08/24/2021 03:02 PM   LDLCALC 120 (H) 08/24/2021 03:02 PM    Wt Readings from Last 3 Encounters:  10/05/21 131 lb 13.4 oz (59.8 kg)  08/26/21 131 lb 12.8 oz (59.8 kg)  08/24/21 130 lb 12.8 oz (59.3 kg)     Exam:    Vital Signs:  Ht 5\' 5"  (1.651 m)    BMI 21.94 kg/m     Physical Exam Vitals reviewed.  Constitutional:      General: She is not in acute distress.    Appearance: Normal appearance. She is well-developed.  Pulmonary:     Effort: Pulmonary effort is normal. No respiratory distress.  Neurological:     General: No focal deficit present.     Mental Status: She is alert and oriented to person, place, and time. Mental status is at baseline.     Cranial Nerves: No cranial nerve deficit.  Psychiatric:        Mood and Affect: Mood and affect normal.        Behavior: Behavior normal.        Thought Content: Thought content normal.        Cognition and Memory: Memory normal.        Judgment: Judgment normal.    ASSESSMENT & PLAN:    1. Acute cough She is to take over the counter cough syrup as needed, fluticasone may also help.   2. Chest congestion She is encouraged to take mucinex during the day and to avoid high dairy foods.  - guaiFENesin (MUCINEX) 600 MG 12 hr tablet; Take 1 tablet (600 mg total) by mouth 2 (two) times daily.  Dispense: 60 tablet; Refill: 2  3. Acute nonintractable  headache, unspecified headache type She will come to the office for covid test today rapid, will send PCR if negative. This may also be related to allergies has been warm in the area. She is to call on Thursday to let us know if she is not feeling better.  - fluticasone (FLONASE) 50 MCG/ACT nasal spray; Place 2 sprays into both nostrils daily.  Dispense: 18.2 mL; Refill: 2   COVID-19 Education: The signs and symptoms of COVID-19 were discussed with the patient  and how to seek care for testing (follow up with PCP or arrange E-visit).  The importance of social distancing was discussed today.  Patient Risk:   After full review of this patients clinical status, I feel that they are at least moderate risk at this time.  Time:   Today, I have spent 15 minutes/ seconds with the patient with telehealth technology discussing above diagnoses.     Medication Adjustments/Labs and Tests Ordered: Current medicines are reviewed at length with the patient today.  Concerns regarding medicines are outlined above.   Tests Ordered: No orders of the defined types were placed in this encounter.   Medication Changes: Meds ordered this encounter  Medications   guaiFENesin (MUCINEX) 600 MG 12 hr tablet    Sig: Take 1 tablet (600 mg total) by mouth 2 (two) times daily.    Dispense:  60 tablet    Refill:  2   fluticasone (FLONASE) 50 MCG/ACT nasal spray    Sig: Place 2 sprays into both nostrils daily.    Dispense:  18.2 mL    Refill:  2    Disposition:  Follow up prn  Signed, Minette Brine, FNP

## 2021-10-20 ENCOUNTER — Other Ambulatory Visit: Payer: BC Managed Care – PPO

## 2021-10-20 DIAGNOSIS — R3989 Other symptoms and signs involving the genitourinary system: Secondary | ICD-10-CM

## 2021-10-20 LAB — POCT URINALYSIS DIPSTICK
Bilirubin, UA: NEGATIVE
Glucose, UA: NEGATIVE
Ketones, UA: NEGATIVE
Leukocytes, UA: NEGATIVE
Nitrite, UA: NEGATIVE
Protein, UA: POSITIVE — AB
Spec Grav, UA: 1.02 (ref 1.010–1.025)
Urobilinogen, UA: 0.2 E.U./dL
pH, UA: 5.5 (ref 5.0–8.0)

## 2021-10-21 ENCOUNTER — Other Ambulatory Visit: Payer: Self-pay

## 2021-10-21 MED ORDER — AMOXICILLIN 500 MG PO TABS: 500.0000 mg | ORAL_TABLET | Freq: Two times a day (BID) | ORAL | 0 refills | Status: DC

## 2021-10-22 LAB — URINE CULTURE

## 2021-11-08 ENCOUNTER — Encounter: Payer: Self-pay | Admitting: Nurse Practitioner

## 2021-11-17 ENCOUNTER — Encounter: Payer: Self-pay | Admitting: Nurse Practitioner

## 2021-11-17 ENCOUNTER — Other Ambulatory Visit: Payer: Self-pay

## 2021-11-17 ENCOUNTER — Ambulatory Visit (INDEPENDENT_AMBULATORY_CARE_PROVIDER_SITE_OTHER): Payer: BC Managed Care – PPO | Admitting: Nurse Practitioner

## 2021-11-17 VITALS — BP 110/70 | HR 80 | Temp 97.7°F | Ht 65.2 in | Wt 133.0 lb

## 2021-11-17 DIAGNOSIS — D582 Other hemoglobinopathies: Secondary | ICD-10-CM

## 2021-11-17 DIAGNOSIS — Z5320 Procedure and treatment not carried out because of patient's decision for unspecified reasons: Secondary | ICD-10-CM

## 2021-11-17 DIAGNOSIS — E782 Mixed hyperlipidemia: Secondary | ICD-10-CM

## 2021-11-17 DIAGNOSIS — E0590 Thyrotoxicosis, unspecified without thyrotoxic crisis or storm: Secondary | ICD-10-CM

## 2021-11-17 DIAGNOSIS — Z0000 Encounter for general adult medical examination without abnormal findings: Secondary | ICD-10-CM

## 2021-11-17 DIAGNOSIS — Z8709 Personal history of other diseases of the respiratory system: Secondary | ICD-10-CM

## 2021-11-17 DIAGNOSIS — I119 Hypertensive heart disease without heart failure: Secondary | ICD-10-CM | POA: Diagnosis not present

## 2021-11-17 LAB — POCT URINALYSIS DIPSTICK
Bilirubin, UA: NEGATIVE
Glucose, UA: NEGATIVE
Ketones, UA: NEGATIVE
Leukocytes, UA: NEGATIVE
Nitrite, UA: NEGATIVE
Protein, UA: POSITIVE — AB
Spec Grav, UA: 1.02 (ref 1.010–1.025)
Urobilinogen, UA: 0.2 E.U./dL
pH, UA: 6 (ref 5.0–8.0)

## 2021-11-17 MED ORDER — ALBUTEROL SULFATE HFA 108 (90 BASE) MCG/ACT IN AERS: 2.0000 | INHALATION_SPRAY | Freq: Four times a day (QID) | RESPIRATORY_TRACT | 5 refills | Status: DC | PRN

## 2021-11-17 NOTE — Progress Notes (Signed)
?Rich Brave [place],acting as a Education administrator for [employer], FNP.,have documented all relevant documentation on the behalf of Minette Brine, FNP,as directed by  Minette Brine, FNP while in the presence of Minette Brine, [place].  ? ?This visit occurred during the SARS-CoV-2 public health emergency.  Safety protocols were in place, including screening questions prior to the visit, additional usage of staff PPE, and extensive cleaning of exam room while observing appropriate contact time as indicated for disinfecting solutions. ? ?Subjective:  ?  ? Patient ID: Aimee Austin , female    DOB: Aug 17, 1961 , 61 y.o.   MRN: [id number] ? ? ?Chief Complaint  ?Patient presents with  ? Annual Exam  ? ? ?HPI ? ?Patient presents today for a physical. She reports compliance with her meds. She no longer goes to a GYN provider.  ? ?Wt Readings from Last 3 Encounters: ?11/17/21 : 133 lb (60.3 kg) ?10/05/21 : 131 lb 13.4 oz (59.8 kg) ?08/26/21 : 131 lb 12.8 oz (59.8 kg) ?  ?She is to see the ENT in May for her throat discomfort. ?  ? ?Past Medical History:  ?Diagnosis Date  ? Anemia   ? Arthritis   ? Rheumatoid Arthritis 2010  ? Asthma   ? Eczema   ? Renal disorder   ? Strep throat   ?  ? ?Family History  ?Adopted: Yes  ?Problem Relation Age of Onset  ? Cancer Mother   ? ? ? ?Current Outpatient Medications:  ?  amoxicillin (AMOXIL) 500 MG tablet, Take 1 tablet (500 mg total) by mouth 2 (two) times daily., Disp: 14 tablet, Rfl: 0 ?  Ferrous Sulfate (IRON PO), Take by mouth daily., Disp: , Rfl:  ?  fluocinonide cream (LIDEX) 8.84 %, Apply 1 application topically 2 (two) times daily., Disp: , Rfl:  ?  fluocinonide-emollient (LIDEX-E) 0.05 % cream, Apply 1 application topically 3 (three) times daily., Disp: 60 g, Rfl: 1 ?  fluticasone (FLONASE) 50 MCG/ACT nasal spray, Place 2 sprays into both nostrils daily., Disp: 18.2 mL, Rfl: 2 ?  hydrOXYzine (ATARAX/VISTARIL) 25 MG tablet, Take 1 tablet (25 mg total) by mouth as needed., Disp: 30 tablet, Rfl:  3 ?  lisinopril (ZESTRIL) 10 MG tablet, Take 1 tablet (10 mg total) by mouth daily., Disp: 90 tablet, Rfl: 1 ?  methimazole (TAPAZOLE) 5 MG tablet, Take 1 tablet (5 mg total) by mouth 3 (three) times daily., Disp: 270 tablet, Rfl: 1 ?  Nutritional Supplements (VITAMIN D BOOSTER PO), Take 1 tablet by mouth daily., Disp: , Rfl:  ?  albuterol (VENTOLIN HFA) 108 (90 Base) MCG/ACT inhaler, Inhale 2 puffs into the lungs every 6 (six) hours as needed for wheezing or shortness of breath., Disp: 8 g, Rfl: 5 ?  guaiFENesin (MUCINEX) 600 MG 12 hr tablet, Take 1 tablet (600 mg total) by mouth 2 (two) times daily. (Patient not taking: Reported on 11/17/2021), Disp: 60 tablet, Rfl: 2  ? ?Allergies  ?Allergen Reactions  ? Shellfish Allergy Anaphylaxis  ? Sulfa Antibiotics Anaphylaxis  ?  ? ? ?The patient states she is status post hysterectomy.   No LMP recorded. Patient has had a hysterectomy.. Negative for Dysmenorrhea and Negative for Menorrhagia. Negative for: breast discharge, breast lump(s), breast pain and breast self exam. Associated symptoms include abnormal vaginal bleeding. Pertinent negatives include abnormal bleeding (hematology), anxiety, decreased libido, depression, difficulty falling sleep, dyspareunia, history of infertility, nocturia, sexual dysfunction, sleep disturbances, urinary incontinence, urinary urgency, vaginal discharge and vaginal itching. Diet regular; she  has a Music therapist through her insurance. The patient states her exercise level is minimal- 3 times a week at least 30 minutes ? ?The patient's tobacco use is:  ?Social History  ? ?Tobacco Use  ?Smoking Status Never  ?Smokeless Tobacco Never  ? ?She has been exposed to passive smoke. The patient's alcohol use is:  ?Social History  ? ?Substance and Sexual Activity  ?Alcohol Use No  ? Alcohol/week: 0.0 standard drinks  ? ? ?Review of Systems  ?Constitutional: Negative.   ?HENT: Negative.    ?Eyes: Negative.   ?Respiratory: Negative.     ?Cardiovascular: Negative.   ?     She has a soreness that radiates around her back to her chest that feels like a band.   ?Gastrointestinal: Negative.   ?Endocrine: Negative.   ?Genitourinary: Negative.   ?Musculoskeletal: Negative.   ?Skin: Negative.   ?Allergic/Immunologic: Negative.   ?Neurological: Negative.   ?Hematological: Negative.   ?Psychiatric/Behavioral: Negative.     ? ?Today's Vitals  ? 11/17/21 0830  ?BP: 110/70  ?Pulse: 80  ?Temp: 97.7 ?F (36.5 ?C)  ?Weight: 133 lb (60.3 kg)  ?Height: 5' 5.2" (1.656 m)  ?PainSc: 0-No pain  ? ?Body mass index is 22 kg/m?.  ? ?Objective:  ?Physical Exam ?Vitals reviewed.  ?Constitutional:   ?   Appearance: Normal appearance. She is well-developed.  ?HENT:  ?   Head: Normocephalic and atraumatic.  ?   Right Ear: Hearing, tympanic membrane, ear canal and external ear normal. There is no impacted cerumen.  ?   Left Ear: Hearing, tympanic membrane, ear canal and external ear normal. There is no impacted cerumen.  ?   Nose: Nose normal.  ?   Mouth/Throat:  ?   Mouth: Mucous membranes are moist.  ?Eyes:  ?   General: Lids are normal.  ?   Conjunctiva/sclera: Conjunctivae normal.  ?   Pupils: Pupils are equal, round, and reactive to light.  ?   Funduscopic exam: ?   Right eye: No papilledema.     ?   Left eye: No papilledema.  ?Neck:  ?   Thyroid: No thyroid mass.  ?   Vascular: No carotid bruit.  ?Cardiovascular:  ?   Rate and Rhythm: Normal rate and regular rhythm.  ?   Pulses: Normal pulses.  ?   Heart sounds: Normal heart sounds. No murmur heard. ?Pulmonary:  ?   Effort: Pulmonary effort is normal.  ?   Breath sounds: Normal breath sounds.  ?Chest:  ?Breasts: ?   Right: Normal. No mass or tenderness.  ?   Left: Normal. No mass or tenderness.  ?Abdominal:  ?   General: Abdomen is flat. Bowel sounds are normal.  ?   Palpations: Abdomen is soft.  ?Musculoskeletal:     ?   General: No swelling or tenderness. Normal range of motion.  ?   Cervical back: Full passive range of  motion without pain, normal range of motion and neck supple.  ?   Right lower leg: No edema.  ?   Left lower leg: No edema.  ?Skin: ?   General: Skin is warm and dry.  ?   Capillary Refill: Capillary refill takes less than 2 seconds.  ?Neurological:  ?   General: No focal deficit present.  ?   Mental Status: She is alert and oriented to person, place, and time.  ?   Cranial Nerves: No cranial nerve deficit.  ?   Sensory: No sensory  deficit.  ?   Motor: No weakness.  ?Psychiatric:     ?   Mood and Affect: Mood normal.     ?   Behavior: Behavior normal.     ?   Thought Content: Thought content normal.     ?   Judgment: Judgment normal.  ?  ? ?   ?Assessment And Plan:  ?   ?1. Encounter for health maintenance examination in adult ?- POCT Urinalysis Dipstick (78412) ?- Microalbumin / creatinine urine ratio ?- CMP14+EGFR ?- CBC ?- Hemoglobin A1c ?- Lipid panel ? ?2. Hypertensive heart disease without heart failure ?Comments: Blood pressure is well controlled, continue current medications ?- POCT Urinalysis Dipstick (82081) ?- Microalbumin / creatinine urine ratio ?- CMP14+EGFR ?- CBC ?- Hemoglobin A1c ?- Lipid panel ? ?3. Mixed hyperlipidemia ?Comments: Cholesterol levels continue to be elevated. 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  Declined starting a statin ?- POCT Urinalysis Dipstick (38871) ?- Microalbumin / creatinine urine ratio ? ?4. Hyperthyroidism ?Comments: She is now on methimazole and is scheduled to f/u with Endocrinology later this month.  Advised to contact Endocrinology to discuss potential SE to methimazole ? 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 ?6. History of asthma ?- albuterol (VENTOLIN HFA) 108 (90 Base) MCG/ACT inhaler; Inhale 2 puffs into the lungs every 6 (six) hours as needed for wheezing or  shortness of breath.  Dispense: 8 g; Refill: 5 ? ? ? ?Patient was given opportunity to ask questions. Patient verbalized understanding of the plan and was able to repeat key elements of the plan. All questi

## 2021-11-17 NOTE — Patient Instructions (Signed)

## 2021-11-18 LAB — MICROALBUMIN / CREATININE URINE RATIO
Creatinine, Urine: 116.2 mg/dL
Microalb/Creat Ratio: 353 mg/g creat — ABNORMAL HIGH (ref 0–29)
Microalbumin, Urine: 409.9 ug/mL

## 2021-11-18 LAB — CBC
Hematocrit: 33.6 % — ABNORMAL LOW (ref 34.0–46.6)
Hemoglobin: 11 g/dL — ABNORMAL LOW (ref 11.1–15.9)
MCH: 24.6 pg — ABNORMAL LOW (ref 26.6–33.0)
MCHC: 32.7 g/dL (ref 31.5–35.7)
MCV: 75 fL — ABNORMAL LOW (ref 79–97)
Platelets: 206 10*3/uL (ref 150–450)
RBC: 4.47 x10E6/uL (ref 3.77–5.28)
RDW: 14.9 % (ref 11.7–15.4)
WBC: 7.2 10*3/uL (ref 3.4–10.8)

## 2021-11-18 LAB — LIPID PANEL
Chol/HDL Ratio: 5.7 ratio — ABNORMAL HIGH (ref 0.0–4.4)
Cholesterol, Total: 279 mg/dL — ABNORMAL HIGH (ref 100–199)
HDL: 49 mg/dL (ref >39)
LDL Chol Calc (NIH): 196 mg/dL — ABNORMAL HIGH (ref 0–99)
Triglycerides: 181 mg/dL — ABNORMAL HIGH (ref 0–149)
VLDL Cholesterol Cal: 34 mg/dL (ref 5–40)

## 2021-11-18 LAB — CMP14+EGFR
ALT: 51 IU/L — ABNORMAL HIGH (ref 0–32)
AST: 46 IU/L — ABNORMAL HIGH (ref 0–40)
Albumin/Globulin Ratio: 1.8 (ref 1.2–2.2)
Albumin: 4.7 g/dL (ref 3.8–4.9)
Alkaline Phosphatase: 225 IU/L — ABNORMAL HIGH (ref 44–121)
BUN/Creatinine Ratio: 15 (ref 9–23)
BUN: 21 mg/dL (ref 6–24)
Bilirubin Total: 0.2 mg/dL (ref 0.0–1.2)
CO2: 23 mmol/L (ref 20–29)
Calcium: 9.9 mg/dL (ref 8.7–10.2)
Chloride: 104 mmol/L (ref 96–106)
Creatinine, Ser: 1.36 mg/dL — ABNORMAL HIGH (ref 0.57–1.00)
Globulin, Total: 2.6 g/dL (ref 1.5–4.5)
Glucose: 94 mg/dL (ref 70–99)
Potassium: 5.2 mmol/L (ref 3.5–5.2)
Sodium: 141 mmol/L (ref 134–144)
Total Protein: 7.3 g/dL (ref 6.0–8.5)
eGFR: 45 mL/min/{1.73_m2} — ABNORMAL LOW (ref >59)

## 2021-11-18 LAB — IRON,TIBC AND FERRITIN PANEL
Ferritin: 231 ng/mL — ABNORMAL HIGH (ref 15–150)
Iron Saturation: 26 % (ref 15–55)
Iron: 80 ug/dL (ref 27–159)
Total Iron Binding Capacity: 306 ug/dL (ref 250–450)
UIBC: 226 ug/dL (ref 131–425)

## 2021-11-18 LAB — HEMOGLOBIN A1C
Est. average glucose Bld gHb Est-mCnc: 108 mg/dL
Hgb A1c MFr Bld: 5.4 % (ref 4.8–5.6)

## 2021-12-01 ENCOUNTER — Ambulatory Visit: Payer: BC Managed Care – PPO | Admitting: Internal Medicine

## 2021-12-01 ENCOUNTER — Encounter: Payer: Self-pay | Admitting: Internal Medicine

## 2021-12-01 VITALS — BP 124/70 | HR 80 | Ht 65.2 in | Wt 137.0 lb

## 2021-12-01 DIAGNOSIS — R748 Abnormal levels of other serum enzymes: Secondary | ICD-10-CM | POA: Diagnosis not present

## 2021-12-01 DIAGNOSIS — E0590 Thyrotoxicosis, unspecified without thyrotoxic crisis or storm: Secondary | ICD-10-CM | POA: Diagnosis not present

## 2021-12-01 DIAGNOSIS — E042 Nontoxic multinodular goiter: Secondary | ICD-10-CM | POA: Diagnosis not present

## 2021-12-01 NOTE — Progress Notes (Signed)
? ? ?Name: Aimee Austin  ?MRN/ DOB: [id number], [birth year]    ?Age/ Sex: 61 y.o., female   ? ?PCP: Minette Brine, FNP   ?Reason for Endocrinology Evaluation: Hyperthyroidism   ?   ?Date of Initial Endocrinology Evaluation: 08/26/2021  ? ? ?HPI: ?[name] is a 61 y.o. female with a past medical history of HTN and dyslipidemia. The patient presented for initial endocrinology clinic visit on 08/26/2021 for consultative assistance with her Hyperthyroidism.  ? ?Patient has been diagnosed with hyperthyroidism on 08/24/2021 when she was noted to have a suppressed TSH at 0.005 you IU/mL and elevated free T3 at 5 pg/mL with normal total T4 at 10.7 UG/DL. ? ? ?In review of her chart, she has been noted with [facility] on thyroid ultrasound in 06/2016. She is S/P FNA of the right mid pole and left inferior nodule with benign cytology in 07/2016  ? ? ? ?No Fh of thyroid disease  ? ? ?Started methimazole 08/2021 ? ?SUBJECTIVE:  ? ? ? ?Today (12/01/21):  [name] is here for a follow up on hyperthyroidism.  ? ? ?She has been noted with weight gain  ?She continues with fatigue but also has low iron  ?Denies loose stool or diarrhea but has constipation  ?Denies palpitations  ?Has noted tremors - stable  ?She has occasional leg cramps  ? ? ?She on MVI but no Biotin  ? ? ? ?HISTORY:  ?Past Medical History:  ?Past Medical History:  ?Diagnosis Date  ? Anemia   ? Arthritis   ? Rheumatoid Arthritis 2010  ? Asthma   ? Eczema   ? Renal disorder   ? Strep throat   ? ?Past Surgical History:  ?Past Surgical History:  ?Procedure Laterality Date  ? ABDOMINAL HYSTERECTOMY  03/1998  ? Uterus only  ?  ?Social History:  reports that she has never smoked. She has never used smokeless tobacco. She reports that she does not drink alcohol and does not use drugs. ?Family History: family history includes Cancer in her mother. She was adopted. ? ? ?HOME MEDICATIONS: ?Allergies as of 12/01/2021   ? ?   Reactions  ? Shellfish Allergy Anaphylaxis  ? Sulfa  Antibiotics Anaphylaxis  ? ?  ? ?  ?Medication List  ?  ? ?  ? Accurate as of December 01, 2021  3:14 PM. If you have any questions, ask your nurse or doctor.  ?  ?  ? ?  ? ?STOP taking these medications   ? ?amoxicillin 500 MG tablet ?Commonly known as: AMOXIL ?Stopped by: Dorita Sciara, MD ?  ?guaiFENesin 600 MG 12 hr tablet ?Commonly known as: Mucinex ?Stopped by: Dorita Sciara, MD ?  ? ?  ? ?TAKE these medications   ? ?albuterol 108 (90 Base) MCG/ACT inhaler ?Commonly known as: VENTOLIN HFA ?Inhale 2 puffs into the lungs every 6 (six) hours as needed for wheezing or shortness of breath. ?  ?fluocinonide cream 0.05 % ?Commonly known as: LIDEX ?Apply 1 application topically 2 (two) times daily. ?  ?fluocinonide-emollient 0.05 % cream ?Commonly known as: LIDEX-E ?Apply 1 application topically 3 (three) times daily. ?  ?fluticasone 50 MCG/ACT nasal spray ?Commonly known as: FLONASE ?Place 2 sprays into both nostrils daily. ?  ?hydrOXYzine 25 MG tablet ?Commonly known as: ATARAX ?Take 1 tablet (25 mg total) by mouth as needed. ?  ?IRON PO ?Take by mouth daily. ?  ?lisinopril 10 MG tablet ?Commonly known as: ZESTRIL ?Take 1 tablet (  10 mg total) by mouth daily. ?  ?methimazole 5 MG tablet ?Commonly known as: TAPAZOLE ?Take 1 tablet (5 mg total) by mouth 3 (three) times daily. ?What changed: when to take this ?  ?VITAMIN D BOOSTER PO ?Take 1 tablet by mouth daily. ?  ? ?  ?  ? ? ?REVIEW OF SYSTEMS: ?A comprehensive ROS was conducted with the patient and is negative except as per HPI  ? ? ?OBJECTIVE:  ?VS: BP 124/70 (BP Location: Left Arm, Patient Position: Sitting, Cuff Size: Small)   Pulse 80   Ht 5' 5.2" (1.656 m)   Wt 137 lb (62.1 kg)   SpO2 99%   BMI 22.66 kg/m?   ? ?Wt Readings from Last 3 Encounters:  ?12/01/21 137 lb (62.1 kg)  ?11/17/21 133 lb (60.3 kg)  ?10/05/21 131 lb 13.4 oz (59.8 kg)  ? ? ? ?EXAM: ?General: Pt appears well and is in NAD  ?Neck: General: Supple without adenopathy. ?Thyroid:  Left thyroid nodule appreciated, no nodules on the right appreciated on today's exam   ?Lungs: Clear with good BS bilat with no rales, rhonchi, or wheezes  ?Heart: Auscultation: RRR.  ?Abdomen: Normoactive bowel sounds, soft, nontender, without masses or organomegaly palpable  ?Extremities:  ?BL LE: No pretibial edema normal ROM and strength.  ?Mental Status: Judgment, insight: Intact ?Orientation: Oriented to time, place, and person ?Mood and affect: No depression, anxiety, or agitation  ? ? ? ?DATA REVIEWED: ? ? Latest Reference Range & Units 12/01/21 15:51  ?Sodium 135 - 145 mEq/L 139  ?Potassium 3.5 - 5.1 mEq/L 3.9  ?Chloride 96 - 112 mEq/L 103  ?CO2 19 - 32 mEq/L 27  ?Glucose 70 - 99 mg/dL 110 (H)  ?BUN 6 - 23 mg/dL 22  ?Creatinine 0.40 - 1.20 mg/dL 1.36 (H)  ?Calcium 8.4 - 10.5 mg/dL 9.6  ?Alkaline Phosphatase 39 - 117 U/L ?44 - 121 IU/L 151 (H) ?179 (H)  ?Albumin 3.5 - 5.2 g/dL 4.7  ?AST 0 - 37 U/L 23  ?ALT 0 - 35 U/L 16  ?Total Protein 6.0 - 8.3 g/dL 7.4  ?Total Bilirubin 0.2 - 1.2 mg/dL 0.3  ?GGT 7 - 51 U/L 163 (H)  ?GFR >60.00 mL/min 42.72 (L)  ? ? Latest Reference Range & Units 12/01/21 15:51  ?BONE FRACTION  WILL FOLLOW (P)  ?INTESTINAL FRAC.  WILL FOLLOW (P)  ?LIVER FRACTION  WILL FOLLOW (P)  ?WBC 4.0 - 10.5 K/uL 7.2  ?RBC 3.87 - 5.11 Mil/uL 4.11  ?Hemoglobin 12.0 - 15.0 g/dL 10.4 (L)  ?HCT 36.0 - 46.0 % 30.9 (L)  ?MCV 78.0 - 100.0 fl 75.4 (L)  ?MCHC 30.0 - 36.0 g/dL 33.6  ?RDW 11.5 - 15.5 % 14.7  ?Platelets 150.0 - 400.0 K/uL 186.0  ?Neutrophils 43.0 - 77.0 % 55.2  ?Lymphocytes 12.0 - 46.0 % 37.7  ?Monocytes Relative 3.0 - 12.0 % 3.7  ?Eosinophil 0.0 - 5.0 % 2.5  ?Basophil 0.0 - 3.0 % 0.9  ?NEUT# 1.4 - 7.7 K/uL 4.0  ?Lymphocyte # 0.7 - 4.0 K/uL 2.7  ?Monocyte # 0.1 - 1.0 K/uL 0.3  ?Eosinophils Absolute 0.0 - 0.7 K/uL 0.2  ?Basophils Absolute 0.0 - 0.1 K/uL 0.1  ? ? Latest Reference Range & Units 12/01/21 15:51  ?TSH 0.35 - 5.50 uIU/mL 0.07 (L)  ?Triiodothyronine (T3) 76 - 181 ng/dL 82  ?T4,Free(Direct)  0.60 - 1.60 ng/dL 0.68  ?(L): Data is abnormally low ? ? ? ? ? ? ? ? ?Thyroid ultrasound 09/01/2021 ?Estimated  total number of nodules >/= 1 cm: 3 ?  ?Number of spongiform nodules >/=  2 cm not described below (TR1): 0 ?  ?Number of mixed cystic and solid nodules >/= 1.5 cm not described ?below ([place]): 0 ?  ?_________________________________________________________ ?  ?Interval decreased size of previously biopsied ([employer]hes[employer] category 2) ?right mid solid thyroid nodule (labeled 1, 1.2 cm, previously 1.8 ?cm). No interval development of concerning sonographic features. ?  ?Nodule # 2: ?  ?Location: Right; Mid ?  ?Maximum size: 0.7 cm; Other 2 dimensions: 0.7 x 0.5 cm ?  ?Composition: mixed cystic and solid (1) ?  ?Echogenicity: isoechoic (1) ?  ?Shape: not taller-than-wide (0) ?  ?Margins: smooth (0) ?  ?Echogenic foci: none (0) ?  ?ACR TI-RADS total points: 2. ?  ?ACR TI-RADS risk category: TR2 (2 points). ?  ?ACR TI-RADS recommendations: ?  ?This nodule does NOT meet TI-RADS criteria for biopsy or dedicated ?follow-up. ?  ?_________________________________________________________ ?  ?Nodule # 3: ?  ?Prior biopsy: No ?  ?Location: Right; Mid ?  ?Maximum size: 1.1 cm; Other 2 dimensions: 0.7 x 0.7 cm, previously, ?0.9 x 0.8 x 0.3 cm ?  ?Composition: mixed cystic and solid (1) ?  ?Echogenicity: isoechoic (1) ?  ?Shape: not taller-than-wide (0) ?  ?Margins: ill-defined (0) ?  ?Echogenic foci: none (0) ?  ?ACR TI-RADS total points: 2. ?  ?ACR TI-RADS risk category:  TR2 (2 points). ?  ?Significant change in size (>/= 20% in two dimensions and minimal ?increase of 2 mm): Yes ?  ?Change in features: No ?  ?Change in ACR TI-RADS risk category: No ?  ?ACR TI-RADS recommendations: ?  ?This nodule does NOT meet TI-RADS criteria for biopsy or dedicated ?follow-up. ?  ?_________________________________________________________ ?  ?Nodule # 4: ?  ?Location: Right; Mid ?  ?Maximum size: 0.9 cm; Other 2 dimensions: 0.7 x 0.4 cm ?   ?Composition: solid/almost completely solid (2) ?  ?Echogenicity: isoechoic (1) ?  ?Shape: not taller-than-wide (0) ?  ?Margins: ill-defined (0) ?  ?Echogenic foci: none (0) ?  ?ACR TI-RADS total points: 3. ?

## 2021-12-02 LAB — COMPREHENSIVE METABOLIC PANEL
ALT: 16 U/L (ref 0–35)
AST: 23 U/L (ref 0–37)
Albumin: 4.7 g/dL (ref 3.5–5.2)
Alkaline Phosphatase: 151 U/L — ABNORMAL HIGH (ref 39–117)
BUN: 22 mg/dL (ref 6–23)
CO2: 27 mEq/L (ref 19–32)
Calcium: 9.6 mg/dL (ref 8.4–10.5)
Chloride: 103 mEq/L (ref 96–112)
Creatinine, Ser: 1.36 mg/dL — ABNORMAL HIGH (ref 0.40–1.20)
GFR: 42.72 mL/min — ABNORMAL LOW (ref >60.00)
Glucose, Bld: 110 mg/dL — ABNORMAL HIGH (ref 70–99)
Potassium: 3.9 mEq/L (ref 3.5–5.1)
Sodium: 139 mEq/L (ref 135–145)
Total Bilirubin: 0.3 mg/dL (ref 0.2–1.2)
Total Protein: 7.4 g/dL (ref 6.0–8.3)

## 2021-12-02 LAB — CBC WITH DIFFERENTIAL/PLATELET
Basophils Absolute: 0.1 10*3/uL (ref 0.0–0.1)
Basophils Relative: 0.9 % (ref 0.0–3.0)
Eosinophils Absolute: 0.2 10*3/uL (ref 0.0–0.7)
Eosinophils Relative: 2.5 % (ref 0.0–5.0)
HCT: 30.9 % — ABNORMAL LOW (ref 36.0–46.0)
Hemoglobin: 10.4 g/dL — ABNORMAL LOW (ref 12.0–15.0)
Lymphocytes Relative: 37.7 % (ref 12.0–46.0)
Lymphs Abs: 2.7 10*3/uL (ref 0.7–4.0)
MCHC: 33.6 g/dL (ref 30.0–36.0)
MCV: 75.4 fl — ABNORMAL LOW (ref 78.0–100.0)
Monocytes Absolute: 0.3 10*3/uL (ref 0.1–1.0)
Monocytes Relative: 3.7 % (ref 3.0–12.0)
Neutro Abs: 4 10*3/uL (ref 1.4–7.7)
Neutrophils Relative %: 55.2 % (ref 43.0–77.0)
Platelets: 186 10*3/uL (ref 150.0–400.0)
RBC: 4.11 Mil/uL (ref 3.87–5.11)
RDW: 14.7 % (ref 11.5–15.5)
WBC: 7.2 10*3/uL (ref 4.0–10.5)

## 2021-12-02 LAB — TSH: TSH: 0.07 u[IU]/mL — ABNORMAL LOW (ref 0.35–5.50)

## 2021-12-02 LAB — T4, FREE: Free T4: 0.68 ng/dL (ref 0.60–1.60)

## 2021-12-02 LAB — GAMMA GT: GGT: 163 U/L — ABNORMAL HIGH (ref 7–51)

## 2021-12-03 ENCOUNTER — Encounter: Payer: Self-pay | Admitting: Internal Medicine

## 2021-12-03 NOTE — Telephone Encounter (Signed)
Patient states that she did speak with you but had these further questions as well. Please advise ?

## 2021-12-04 LAB — T3: T3, Total: 82 ng/dL (ref 76–181)

## 2021-12-04 LAB — TRAB (TSH RECEPTOR BINDING ANTIBODY): TRAB: 3.38 IU/L — ABNORMAL HIGH (ref <=2.00)

## 2021-12-06 LAB — ALKALINE PHOSPHATASE, ISOENZYMES
Alkaline Phosphatase: 179 IU/L — ABNORMAL HIGH (ref 44–121)
BONE FRACTION: 42 % (ref 14–68)
INTESTINAL FRAC.: 0 % (ref 0–18)
LIVER FRACTION: 58 % (ref 18–85)

## 2021-12-08 ENCOUNTER — Telehealth: Payer: Self-pay | Admitting: Internal Medicine

## 2021-12-08 NOTE — Telephone Encounter (Signed)
Spoke to the patient on 12/08/2021 at 7:15 AM ? ? ? ?Discussed elevated TRAb-consistent with Graves' disease we discussed Graves' disease is a hereditary autoimmune condition of the thyroid ? ? ? ?Discussed elevated but trending down alkaline phosphatase and elevated GGT, I did explain to her that this is most likely related to thionamides therapy ? ? ?No intervention is needed as long as her alkaline phosphatase< 3-5x upper limit of normal ? ? ?I also explained to the patient that I do understand her concern of having elevated LFTs due to thionamides therapy, she was offered alternative treatment to include radioactive iodine ablation and total thyroidectomy ? ? ?I explained to her that with with these 2 modalities of treatment she will need to be on lifelong LT-for replacement therapy ? ? ?Patient opted to remain on thionamides therapy at this time ?

## 2021-12-20 ENCOUNTER — Ambulatory Visit
Admission: RE | Admit: 2021-12-20 | Discharge: 2021-12-20 | Disposition: A | Discharge status: Home / Self Care | Payer: BC Managed Care – PPO | Source: Ambulatory Visit | Attending: Internal Medicine | Admitting: Internal Medicine

## 2021-12-20 ENCOUNTER — Other Ambulatory Visit (HOSPITAL_COMMUNITY)
Admission: RE | Admit: 2021-12-20 | Discharge: 2021-12-20 | Disposition: A | Discharge status: Home / Self Care | Payer: BC Managed Care – PPO | Source: Ambulatory Visit | Attending: Interventional Radiology | Admitting: Interventional Radiology

## 2021-12-20 DIAGNOSIS — D34 Benign neoplasm of thyroid gland: Secondary | ICD-10-CM | POA: Diagnosis not present

## 2021-12-20 DIAGNOSIS — E042 Nontoxic multinodular goiter: Secondary | ICD-10-CM

## 2021-12-20 DIAGNOSIS — E041 Nontoxic single thyroid nodule: Secondary | ICD-10-CM | POA: Diagnosis present

## 2021-12-21 LAB — CYTOLOGY - NON PAP

## 2021-12-22 ENCOUNTER — Encounter: Payer: Self-pay | Admitting: Internal Medicine

## 2022-01-27 ENCOUNTER — Other Ambulatory Visit (INDEPENDENT_AMBULATORY_CARE_PROVIDER_SITE_OTHER): Payer: BC Managed Care – PPO

## 2022-01-27 DIAGNOSIS — R748 Abnormal levels of other serum enzymes: Secondary | ICD-10-CM

## 2022-01-27 DIAGNOSIS — E0590 Thyrotoxicosis, unspecified without thyrotoxic crisis or storm: Secondary | ICD-10-CM

## 2022-01-28 LAB — COMPREHENSIVE METABOLIC PANEL
ALT: 19 U/L (ref 0–35)
AST: 27 U/L (ref 0–37)
Albumin: 4.8 g/dL (ref 3.5–5.2)
Alkaline Phosphatase: 108 U/L (ref 39–117)
BUN: 34 mg/dL — ABNORMAL HIGH (ref 6–23)
CO2: 26 mEq/L (ref 19–32)
Calcium: 10.1 mg/dL (ref 8.4–10.5)
Chloride: 102 mEq/L (ref 96–112)
Creatinine, Ser: 1.5 mg/dL — ABNORMAL HIGH (ref 0.40–1.20)
GFR: 37.94 mL/min — ABNORMAL LOW (ref >60.00)
Glucose, Bld: 93 mg/dL (ref 70–99)
Potassium: 5 mEq/L (ref 3.5–5.1)
Sodium: 138 mEq/L (ref 135–145)
Total Bilirubin: 0.3 mg/dL (ref 0.2–1.2)
Total Protein: 7.9 g/dL (ref 6.0–8.3)

## 2022-01-28 LAB — TSH: TSH: 2.44 u[IU]/mL (ref 0.35–5.50)

## 2022-01-28 LAB — T4, FREE: Free T4: 0.61 ng/dL (ref 0.60–1.60)

## 2022-02-21 ENCOUNTER — Other Ambulatory Visit: Payer: Self-pay | Admitting: Nurse Practitioner

## 2022-02-21 DIAGNOSIS — N281 Cyst of kidney, acquired: Secondary | ICD-10-CM

## 2022-02-21 DIAGNOSIS — R801 Persistent proteinuria, unspecified: Secondary | ICD-10-CM

## 2022-02-21 DIAGNOSIS — D508 Other iron deficiency anemias: Secondary | ICD-10-CM

## 2022-02-22 LAB — CBC
Hematocrit: 33 % — ABNORMAL LOW (ref 34.0–46.6)
Hemoglobin: 10.5 g/dL — ABNORMAL LOW (ref 11.1–15.9)
MCH: 25.5 pg — ABNORMAL LOW (ref 26.6–33.0)
MCHC: 31.8 g/dL (ref 31.5–35.7)
MCV: 80 fL (ref 79–97)
Platelets: 210 10*3/uL (ref 150–450)
RBC: 4.12 x10E6/uL (ref 3.77–5.28)
RDW: 14.2 % (ref 11.7–15.4)
WBC: 7.4 10*3/uL (ref 3.4–10.8)

## 2022-02-22 LAB — CMP14+EGFR
ALT: 17 IU/L (ref 0–32)
AST: 25 IU/L (ref 0–40)
Albumin/Globulin Ratio: 2 (ref 1.2–2.2)
Albumin: 4.9 g/dL (ref 3.8–4.9)
Alkaline Phosphatase: 115 IU/L (ref 44–121)
BUN/Creatinine Ratio: 21 (ref 9–23)
BUN: 35 mg/dL — ABNORMAL HIGH (ref 6–24)
Bilirubin Total: 0.2 mg/dL (ref 0.0–1.2)
CO2: 22 mmol/L (ref 20–29)
Calcium: 9.4 mg/dL (ref 8.7–10.2)
Chloride: 102 mmol/L (ref 96–106)
Creatinine, Ser: 1.67 mg/dL — ABNORMAL HIGH (ref 0.57–1.00)
Globulin, Total: 2.5 g/dL (ref 1.5–4.5)
Glucose: 90 mg/dL (ref 70–99)
Potassium: 4.7 mmol/L (ref 3.5–5.2)
Sodium: 137 mmol/L (ref 134–144)
Total Protein: 7.4 g/dL (ref 6.0–8.5)
eGFR: 35 mL/min/{1.73_m2} — ABNORMAL LOW (ref >59)

## 2022-02-22 LAB — PROTEIN / CREATININE RATIO, URINE
Creatinine, Urine: 76 mg/dL
Protein, Ur: 21.3 mg/dL
Protein/Creat Ratio: 280 mg/g creat — ABNORMAL HIGH (ref 0–200)

## 2022-02-22 LAB — IRON,TIBC AND FERRITIN PANEL
Ferritin: 153 ng/mL — ABNORMAL HIGH (ref 15–150)
Iron Saturation: 21 % (ref 15–55)
Iron: 68 ug/dL (ref 27–159)
Total Iron Binding Capacity: 330 ug/dL (ref 250–450)
UIBC: 262 ug/dL (ref 131–425)

## 2022-02-22 LAB — HEMOGLOBIN A1C
Est. average glucose Bld gHb Est-mCnc: 120 mg/dL
Hgb A1c MFr Bld: 5.8 % — ABNORMAL HIGH (ref 4.8–5.6)

## 2022-02-22 LAB — MAGNESIUM: Magnesium: 2.1 mg/dL (ref 1.6–2.3)

## 2022-03-08 DIAGNOSIS — N390 Urinary tract infection, site not specified: Secondary | ICD-10-CM | POA: Diagnosis not present

## 2022-03-08 DIAGNOSIS — J029 Acute pharyngitis, unspecified: Secondary | ICD-10-CM | POA: Diagnosis not present

## 2022-03-08 DIAGNOSIS — R82998 Other abnormal findings in urine: Secondary | ICD-10-CM | POA: Diagnosis not present

## 2022-03-23 ENCOUNTER — Ambulatory Visit: Payer: BC Managed Care – PPO | Admitting: Nurse Practitioner

## 2022-03-25 DIAGNOSIS — N269 Renal sclerosis, unspecified: Secondary | ICD-10-CM | POA: Diagnosis not present

## 2022-03-25 DIAGNOSIS — N179 Acute kidney failure, unspecified: Secondary | ICD-10-CM | POA: Diagnosis not present

## 2022-03-30 ENCOUNTER — Ambulatory Visit: Payer: BC Managed Care – PPO | Admitting: Nurse Practitioner

## 2022-03-30 ENCOUNTER — Encounter: Payer: Self-pay | Admitting: Nurse Practitioner

## 2022-03-30 VITALS — BP 130/64 | HR 78 | Temp 98.2°F | Ht 65.0 in | Wt 141.0 lb

## 2022-03-30 DIAGNOSIS — Z8709 Personal history of other diseases of the respiratory system: Secondary | ICD-10-CM

## 2022-03-30 DIAGNOSIS — E782 Mixed hyperlipidemia: Secondary | ICD-10-CM | POA: Diagnosis not present

## 2022-03-30 DIAGNOSIS — E0590 Thyrotoxicosis, unspecified without thyrotoxic crisis or storm: Secondary | ICD-10-CM | POA: Diagnosis not present

## 2022-03-30 DIAGNOSIS — I10 Essential (primary) hypertension: Secondary | ICD-10-CM

## 2022-03-30 DIAGNOSIS — Z872 Personal history of diseases of the skin and subcutaneous tissue: Secondary | ICD-10-CM

## 2022-03-30 MED ORDER — METHIMAZOLE 5 MG PO TABS: ORAL_TABLET | ORAL | 1 refills | Status: DC

## 2022-03-30 MED ORDER — LISINOPRIL 10 MG PO TABS: 10.0000 mg | ORAL_TABLET | Freq: Every day | ORAL | 1 refills | Status: DC

## 2022-03-30 MED ORDER — ALBUTEROL SULFATE HFA 108 (90 BASE) MCG/ACT IN AERS: 2.0000 | INHALATION_SPRAY | Freq: Four times a day (QID) | RESPIRATORY_TRACT | 5 refills | Status: DC | PRN

## 2022-03-30 MED ORDER — FLUOCINONIDE EMULSIFIED BASE 0.05 % EX CREA: 1.0000 | TOPICAL_CREAM | Freq: Three times a day (TID) | CUTANEOUS | 1 refills | Status: DC

## 2022-03-30 NOTE — Patient Instructions (Signed)
Hypertension, Adult ?Hypertension is another name for high blood pressure. High blood pressure forces your heart to work harder to pump blood. This can cause problems over time. ?There are two numbers in a blood pressure reading. There is a top number (systolic) over a bottom number (diastolic). It is best to have a blood pressure that is below 120/80. ?What are the causes? ?The cause of this condition is not known. Some other conditions can lead to high blood pressure. ?What increases the risk? ?Some lifestyle factors can make you more likely to develop high blood pressure: ?Smoking. ?Not getting enough exercise or physical activity. ?Being overweight. ?Having too much fat, sugar, calories, or salt (sodium) in your diet. ?Drinking too much alcohol. ?Other risk factors include: ?Having any of these conditions: ?Heart disease. ?Diabetes. ?High cholesterol. ?Kidney disease. ?Obstructive sleep apnea. ?Having a family history of high blood pressure and high cholesterol. ?Age. The risk increases with age. ?Stress. ?What are the signs or symptoms? ?High blood pressure may not cause symptoms. Very high blood pressure (hypertensive crisis) may cause: ?Headache. ?Fast or uneven heartbeats (palpitations). ?Shortness of breath. ?Nosebleed. ?Vomiting or feeling like you may vomit (nauseous). ?Changes in how you see. ?Very bad chest pain. ?Feeling dizzy. ?Seizures. ?How is this treated? ?This condition is treated by making healthy lifestyle changes, such as: ?Eating healthy foods. ?Exercising more. ?Drinking less alcohol. ?Your doctor may prescribe medicine if lifestyle changes do not help enough and if: ?Your top number is above 130. ?Your bottom number is above 80. ?Your personal target blood pressure may vary. ?Follow these instructions at home: ?Eating and drinking ? ?If told, follow the DASH eating plan. To follow this plan: ?Fill one half of your plate at each meal with fruits and vegetables. ?Fill one fourth of your plate  at each meal with whole grains. Whole grains include whole-wheat pasta, brown rice, and whole-grain bread. ?Eat or drink low-fat dairy products, such as skim milk or low-fat yogurt. ?Fill one fourth of your plate at each meal with low-fat (lean) proteins. Low-fat proteins include fish, chicken without skin, eggs, beans, and tofu. ?Avoid fatty meat, cured and processed meat, or chicken with skin. ?Avoid pre-made or processed food. ?Limit the amount of salt in your diet to less than 1,500 mg each day. ?Do not drink alcohol if: ?Your doctor tells you not to drink. ?You are pregnant, may be pregnant, or are planning to become pregnant. ?If you drink alcohol: ?Limit how much you have to: ?0-1 drink a day for women. ?0-2 drinks a day for men. ?Know how much alcohol is in your drink. In the [place] one drink equals one 12 oz bottle of beer (355 mL), one 5 oz glass of wine (148 mL), or one 1? oz glass of hard liquor (44 mL). ?Lifestyle ? ?Work with your doctor to stay at a healthy weight or to lose weight. Ask your doctor what the best weight is for you. ?Get at least 30 minutes of exercise that causes your heart to beat faster (aerobic exercise) most days of the week. This may include walking, swimming, or biking. ?Get at least 30 minutes of exercise that strengthens your muscles (resistance exercise) at least 3 days a week. This may include lifting weights or doing Pilates. ?Do not smoke or use any products that contain nicotine or tobacco. If you need help quitting, ask your doctor. ?Check your blood pressure at home as told by your doctor. ?Keep all follow-up visits. ?Medicines ?Take over-the-counter and prescription medicines   only as told by your doctor. Follow directions carefully. ?Do not skip doses of blood pressure medicine. The medicine does not work as well if you skip doses. Skipping doses also puts you at risk for problems. ?Ask your doctor about side effects or reactions to medicines that you should watch  for. ?Contact a doctor if: ?You think you are having a reaction to the medicine you are taking. ?You have headaches that keep coming back. ?You feel dizzy. ?You have swelling in your ankles. ?You have trouble with your vision. ?Get help right away if: ?You get a very bad headache. ?You start to feel mixed up (confused). ?You feel weak or numb. ?You feel faint. ?You have very bad pain in your: ?Chest. ?Belly (abdomen). ?You vomit more than once. ?You have trouble breathing. ?These symptoms may be an emergency. Get help right away. Call 911. ?Do not wait to see if the symptoms will go away. ?Do not drive yourself to the hospital. ?Summary ?Hypertension is another name for high blood pressure. ?High blood pressure forces your heart to work harder to pump blood. ?For most people, a normal blood pressure is less than 120/80. ?Making healthy choices can help lower blood pressure. If your blood pressure does not get lower with healthy choices, you may need to take medicine. ?This information is not intended to replace advice given to you by your health care provider. Make sure you discuss any questions you have with your health care provider. ?Document Revised: 06/10/2021 Document Reviewed: 06/10/2021 ?Elsevier Patient Education ? 2023 Elsevier Inc. ? ?

## 2022-03-30 NOTE — Progress Notes (Signed)
Barnet Glasgow Martin,acting as a Education administrator for Minette Brine, FNP.,have documented all relevant documentation on the behalf of Minette Brine, FNP,as directed by  Minette Brine, FNP while in the presence of Minette Brine, .    Subjective:     Patient ID: Aimee Austin , female    DOB: 05/31/1961 , 61 y.o.   MRN: [id number]   Chief Complaint  Patient presents with   Hyperlipidemia    HPI  Patient presents today for a BP check. Patient states she has little white spots all over her body, she first noticed about a month ago, No itching or hurting. She went to Nephrology and had a kidney biopsy on Friday awaiting results. Her creatinine level had increased since the beginning of the year.   BP Readings from Last 3 Encounters: 03/30/22 : 130/64 12/01/21 : 124/70 11/17/21 : 110/70    Hypertension This is a chronic problem. The current episode started more than 1 year ago. The problem has been gradually improving since onset. The problem is controlled.     Past Medical History:  Diagnosis Date   Anemia    Arthritis    Rheumatoid Arthritis 2010   Asthma    Eczema    Renal disorder    Strep throat      Family History  Adopted: Yes  Problem Relation Age of Onset   Cancer Mother      Current Outpatient Medications:    Ferrous Sulfate (IRON PO), Take by mouth daily., Disp: , Rfl:    fluocinonide cream (LIDEX) 0.62 %, Apply 1 application topically 2 (two) times daily., Disp: , Rfl:    hydrOXYzine (ATARAX/VISTARIL) 25 MG tablet, Take 1 tablet (25 mg total) by mouth as needed., Disp: 30 tablet, Rfl: 3   albuterol (VENTOLIN HFA) 108 (90 Base) MCG/ACT inhaler, Inhale 2 puffs into the lungs every 6 (six) hours as needed for wheezing or shortness of breath., Disp: 8 g, Rfl: 5   fluocinonide-emollient (LIDEX-E) 0.05 % cream, Apply 1 Application topically 3 (three) times daily., Disp: 60 g, Rfl: 1   fluticasone (FLONASE) 50 MCG/ACT nasal spray, Place 2 sprays into both nostrils daily. (Patient  not taking: Reported on 03/30/2022), Disp: 18.2 mL, Rfl: 2   lisinopril (ZESTRIL) 10 MG tablet, Take 1 tablet (10 mg total) by mouth daily., Disp: 90 tablet, Rfl: 1   methimazole (TAPAZOLE) 5 MG tablet, Take 1 tab by mouth in am and 1/2 tablet by mouth pm, Disp: 135 tablet, Rfl: 1   Nutritional Supplements (VITAMIN D BOOSTER PO), Take 1 tablet by mouth daily. (Patient not taking: Reported on 03/30/2022), Disp: , Rfl:    Allergies  Allergen Reactions   Shellfish Allergy Anaphylaxis   Sulfa Antibiotics Anaphylaxis     Review of Systems  Constitutional: Negative.   HENT: Negative.    Eyes: Negative.   Respiratory: Negative.    Cardiovascular: Negative.   Gastrointestinal: Negative.   Endocrine: Negative.   Genitourinary: Negative.   Musculoskeletal: Negative.   Skin: Negative.   Allergic/Immunologic: Negative.   Neurological: Negative.   Hematological: Negative.   Psychiatric/Behavioral: Negative.       Today's Vitals   03/30/22 0845  BP: 130/64  Pulse: 78  Temp: 98.2 F (36.8 C)  TempSrc: Oral  Weight: 141 lb (64 kg)  Height: '5\' 5"'$  (1.651 m)  PainSc: 0-No pain   Body mass index is 23.46 kg/m.  Wt Readings from Last 3 Encounters:  03/30/22 141 lb (64 kg)  12/01/21 137 lb (  62.1 kg)  11/17/21 133 lb (60.3 kg)    Objective:  Physical Exam Vitals reviewed.  Constitutional:      General: She is not in acute distress.    Appearance: Normal appearance.  Cardiovascular:     Rate and Rhythm: Normal rate and regular rhythm.     Pulses: Normal pulses.     Heart sounds: Normal heart sounds. No murmur heard. Pulmonary:     Effort: Pulmonary effort is normal. No respiratory distress.     Breath sounds: Normal breath sounds. No wheezing.  Musculoskeletal:        General: No tenderness.  Skin:    General: Skin is warm and dry.     Capillary Refill: Capillary refill takes less than 2 seconds.  Neurological:     General: No focal deficit present.     Mental Status: She is  alert and oriented to person, place, and time.     Cranial Nerves: No cranial nerve deficit.     Motor: No weakness.  Psychiatric:        Mood and Affect: Mood is not anxious.        Behavior: Behavior normal.        Thought Content: Thought content normal.        Judgment: Judgment normal.         Assessment And Plan:     1. Essential hypertension, benign Comments: Blood pressure is well controlled, continue current medications - lisinopril (ZESTRIL) 10 MG tablet; Take 1 tablet (10 mg total) by mouth daily.  Dispense: 90 tablet; Refill: 1  2. Mixed hyperlipidemia Comments: Stable, continue statin, tolerating well.  - Lipid panel; Future  3. Hyperthyroidism Comments: Continue follow up with Endocrinology, updated her methimazole order - methimazole (TAPAZOLE) 5 MG tablet; Take 1 tab by mouth in am and 1/2 tablet by mouth pm  Dispense: 135 tablet; Refill: 1  4. History of eczema - fluocinonide-emollient (LIDEX-E) 0.05 % cream; Apply 1 Application topically 3 (three) times daily.  Dispense: 60 g; Refill: 1  5. History of asthma - albuterol (VENTOLIN HFA) 108 (90 Base) MCG/ACT inhaler; Inhale 2 puffs into the lungs every 6 (six) hours as needed for wheezing or shortness of breath.  Dispense: 8 g; Refill: 5     Patient was given opportunity to ask questions. Patient verbalized understanding of the plan and was able to repeat key elements of the plan. All questions were answered to their satisfaction.  Minette Brine, FNP   I, Minette Brine, FNP, have reviewed all documentation for this visit. The documentation on 03/30/22 for the exam, diagnosis, procedures, and orders are all accurate and complete.   IF YOU HAVE BEEN REFERRED TO A SPECIALIST, IT MAY TAKE 1-2 WEEKS TO SCHEDULE/PROCESS THE REFERRAL. IF YOU HAVE NOT HEARD FROM US/SPECIALIST IN TWO WEEKS, PLEASE GIVE [place] A CALL AT [phone].   THE PATIENT IS ENCOURAGED TO PRACTICE SOCIAL DISTANCING DUE TO THE COVID-19 PANDEMIC.

## 2022-04-04 ENCOUNTER — Other Ambulatory Visit: Payer: BC Managed Care – PPO

## 2022-04-04 DIAGNOSIS — R801 Persistent proteinuria, unspecified: Secondary | ICD-10-CM

## 2022-04-04 DIAGNOSIS — N281 Cyst of kidney, acquired: Secondary | ICD-10-CM

## 2022-04-04 DIAGNOSIS — D508 Other iron deficiency anemias: Secondary | ICD-10-CM

## 2022-04-04 DIAGNOSIS — E782 Mixed hyperlipidemia: Secondary | ICD-10-CM

## 2022-04-05 LAB — CMP14+EGFR
ALT: 21 IU/L (ref 0–32)
AST: 29 IU/L (ref 0–40)
Albumin/Globulin Ratio: 1.8 (ref 1.2–2.2)
Albumin: 4.8 g/dL (ref 3.8–4.9)
Alkaline Phosphatase: 102 IU/L (ref 44–121)
BUN/Creatinine Ratio: 15 (ref 9–23)
BUN: 22 mg/dL (ref 6–24)
Bilirubin Total: <0.2 mg/dL (ref 0.0–1.2)
CO2: 21 mmol/L (ref 20–29)
Calcium: 9.6 mg/dL (ref 8.7–10.2)
Chloride: 104 mmol/L (ref 96–106)
Creatinine, Ser: 1.43 mg/dL — ABNORMAL HIGH (ref 0.57–1.00)
Globulin, Total: 2.6 g/dL (ref 1.5–4.5)
Glucose: 92 mg/dL (ref 70–99)
Potassium: 4.5 mmol/L (ref 3.5–5.2)
Sodium: 140 mmol/L (ref 134–144)
Total Protein: 7.4 g/dL (ref 6.0–8.5)
eGFR: 42 mL/min/{1.73_m2} — ABNORMAL LOW (ref >59)

## 2022-04-05 LAB — HEMOGLOBIN A1C
Est. average glucose Bld gHb Est-mCnc: 114 mg/dL
Hgb A1c MFr Bld: 5.6 % (ref 4.8–5.6)

## 2022-04-05 LAB — MICROALBUMIN / CREATININE URINE RATIO
Creatinine, Urine: 90.6 mg/dL
Microalb/Creat Ratio: 217 mg/g creat — ABNORMAL HIGH (ref 0–29)
Microalbumin, Urine: 196.6 ug/mL

## 2022-04-05 LAB — IRON,TIBC AND FERRITIN PANEL
Ferritin: 133 ng/mL (ref 15–150)
Iron Saturation: 21 % (ref 15–55)
Iron: 66 ug/dL (ref 27–159)
Total Iron Binding Capacity: 307 ug/dL (ref 250–450)
UIBC: 241 ug/dL (ref 131–425)

## 2022-04-05 LAB — CBC
Hematocrit: 33.5 % — ABNORMAL LOW (ref 34.0–46.6)
Hemoglobin: 10.3 g/dL — ABNORMAL LOW (ref 11.1–15.9)
MCH: 24.6 pg — ABNORMAL LOW (ref 26.6–33.0)
MCHC: 30.7 g/dL — ABNORMAL LOW (ref 31.5–35.7)
MCV: 80 fL (ref 79–97)
Platelets: 174 10*3/uL (ref 150–450)
RBC: 4.18 x10E6/uL (ref 3.77–5.28)
RDW: 14 % (ref 11.7–15.4)
WBC: 7.6 10*3/uL (ref 3.4–10.8)

## 2022-04-05 LAB — LIPID PANEL
Chol/HDL Ratio: 5.3 ratio — ABNORMAL HIGH (ref 0.0–4.4)
Cholesterol, Total: 250 mg/dL — ABNORMAL HIGH (ref 100–199)
HDL: 47 mg/dL (ref >39)
LDL Chol Calc (NIH): 175 mg/dL — ABNORMAL HIGH (ref 0–99)
Triglycerides: 155 mg/dL — ABNORMAL HIGH (ref 0–149)
VLDL Cholesterol Cal: 28 mg/dL (ref 5–40)

## 2022-04-05 LAB — MAGNESIUM: Magnesium: 2.1 mg/dL (ref 1.6–2.3)

## 2022-04-19 ENCOUNTER — Encounter: Payer: Self-pay | Admitting: Internal Medicine

## 2022-04-19 ENCOUNTER — Ambulatory Visit (INDEPENDENT_AMBULATORY_CARE_PROVIDER_SITE_OTHER): Payer: BC Managed Care – PPO | Admitting: Internal Medicine

## 2022-04-19 VITALS — BP 112/72 | HR 86 | Ht 65.0 in | Wt 143.0 lb

## 2022-04-19 DIAGNOSIS — E0590 Thyrotoxicosis, unspecified without thyrotoxic crisis or storm: Secondary | ICD-10-CM | POA: Diagnosis not present

## 2022-04-19 DIAGNOSIS — E042 Nontoxic multinodular goiter: Secondary | ICD-10-CM | POA: Diagnosis not present

## 2022-04-19 DIAGNOSIS — R748 Abnormal levels of other serum enzymes: Secondary | ICD-10-CM | POA: Diagnosis not present

## 2022-04-19 NOTE — Progress Notes (Unsigned)
Name: Aimee Austin  MRN/ DOB: [id number], 1961-06-08    Age/ Sex: 61 y.o., female    PCP: Arnette Felts, FNP   Reason for Endocrinology Evaluation: Hyperthyroidism      Date of Initial Endocrinology Evaluation: 08/26/2021    HPI: [name] is a 61 y.o. female with a past medical history of HTN and dyslipidemia. The patient presented for initial endocrinology clinic visit on 08/26/2021 for consultative assistance with her Hyperthyroidism.   Patient has been diagnosed with hyperthyroidism on 08/24/2021 when she was noted to have a suppressed TSH at 0.005 you IU/mL and elevated free T3 at 5 pg/mL with normal total T4 at 10.7 UG/DL.   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 She is S/P FNA of the right mid pole and left inferior nodule with benign cytology in 07/2016     No Fh of thyroid disease    Started methimazole 08/2021  TRAB elevated 3.38 IU/L 11/2021  SUBJECTIVE:     Today (04/19/22):  Ms. [name] is here for a follow up on hyperthyroidism.    She has been noted with weight gain  No local neck symptoms  Denies loose stool or diarrhea Denies palpitations GERD symptoms have resolved  Still has hand tremors   No Biotin   Methimazole 5 mg, 1.5 tabs daily      HISTORY:  Past Medical History:  Past Medical History:  Diagnosis Date   Anemia    Arthritis    Rheumatoid Arthritis 2010   Asthma    Eczema    Renal disorder    Strep throat    Past Surgical History:  Past Surgical History:  Procedure Laterality Date   ABDOMINAL HYSTERECTOMY  03/1998   Uterus only    Social History:  reports that she has never smoked. She has never used smokeless tobacco. She reports that she does not drink alcohol and does not use drugs. Family History: family history includes Cancer in her mother. She was adopted.   HOME MEDICATIONS: Allergies as of 04/19/2022       Reactions   Shellfish Allergy Anaphylaxis   Sulfa  Antibiotics Anaphylaxis        Medication List        Accurate as of April 19, 2022 11:56 AM. If you have any questions, ask your nurse or doctor.          albuterol 108 (90 Base) MCG/ACT inhaler Commonly known as: VENTOLIN HFA Inhale 2 puffs into the lungs every 6 (six) hours as needed for wheezing or shortness of breath.   fluocinonide cream 0.05 % Commonly known as: LIDEX Apply 1 application topically 2 (two) times daily.   fluocinonide-emollient 0.05 % cream Commonly known as: LIDEX-E Apply 1 Application topically 3 (three) times daily.   fluticasone 50 MCG/ACT nasal spray Commonly known as: FLONASE Place 2 sprays into both nostrils daily.   hydrOXYzine 25 MG tablet Commonly known as: ATARAX Take 1 tablet (25 mg total) by mouth as needed.   IRON PO Take by mouth daily.   lisinopril 10 MG tablet Commonly known as: ZESTRIL Take 1 tablet (10 mg total) by mouth daily.   methimazole 5 MG tablet Commonly known as: TAPAZOLE Take 1 tab by mouth in am and 1/2 tablet by mouth pm   VITAMIN D BOOSTER PO Take 1 tablet by mouth daily.          REVIEW OF SYSTEMS: A  comprehensive ROS was conducted with the patient and is negative except as per HPI    OBJECTIVE:  VS: There were no vitals taken for this visit.   Wt Readings from Last 3 Encounters:  03/30/22 141 lb (64 kg)  12/01/21 137 lb (62.1 kg)  11/17/21 133 lb (60.3 kg)     EXAM: General: Pt appears well and is in NAD  Neck: General: Supple without adenopathy. Thyroid: Left thyroid nodule appreciated, no nodules on the right appreciated on today's exam   Lungs: Clear with good BS bilat with no rales, rhonchi, or wheezes  Heart: Auscultation: RRR.  Abdomen: Normoactive bowel sounds, soft, nontender, without masses or organomegaly palpable  Extremities:  BL LE: No pretibial edema normal ROM and strength.  Mental Status: Judgment, insight: Intact Orientation: Oriented to time, place, and person Mood  and affect: No depression, anxiety, or agitation     DATA REVIEWED: ***  Thyroid ultrasound 09/01/2021 Estimated total number of nodules >/= 1 cm: 3   Number of spongiform nodules >/=  2 cm not described below (TR1): 0   Number of mixed cystic and solid nodules >/= 1.5 cm not described below ([place]): 0   _________________________________________________________   Interval decreased size of previously biopsied (Bethesda category 2) right mid solid thyroid nodule (labeled 1, 1.2 cm, previously 1.8 cm). No interval development of concerning sonographic features.   Nodule # 2:   Location: Right; Mid   Maximum size: 0.7 cm; Other 2 dimensions: 0.7 x 0.5 cm   Composition: mixed cystic and solid (1)   Echogenicity: isoechoic (1)   Shape: not taller-than-wide (0)   Margins: smooth (0)   Echogenic foci: none (0)   ACR TI-RADS total points: 2.   ACR TI-RADS risk category: TR2 (2 points).   ACR TI-RADS recommendations:   This nodule does NOT meet TI-RADS criteria for biopsy or dedicated follow-up.   _________________________________________________________   Nodule # 3:   Prior biopsy: No   Location: Right; Mid   Maximum size: 1.1 cm; Other 2 dimensions: 0.7 x 0.7 cm, previously, 0.9 x 0.8 x 0.3 cm   Composition: mixed cystic and solid (1)   Echogenicity: isoechoic (1)   Shape: not taller-than-wide (0)   Margins: ill-defined (0)   Echogenic foci: none (0)   ACR TI-RADS total points: 2.   ACR TI-RADS risk category:  TR2 (2 points).   Significant change in size (>/= 20% in two dimensions and minimal increase of 2 mm): Yes   Change in features: No   Change in ACR TI-RADS risk category: No   ACR TI-RADS recommendations:   This nodule does NOT meet TI-RADS criteria for biopsy or dedicated follow-up.   _________________________________________________________   Nodule # 4:   Location: Right; Mid   Maximum size: 0.9 cm; Other 2 dimensions: 0.7 x  0.4 cm   Composition: solid/almost completely solid (2)   Echogenicity: isoechoic (1)   Shape: not taller-than-wide (0)   Margins: ill-defined (0)   Echogenic foci: none (0)   ACR TI-RADS total points: 3.   ACR TI-RADS risk category: TR3 (3 points).   ACR TI-RADS recommendations:   Given size (<1.4 cm) and appearance, this nodule does NOT meet TI-RADS criteria for biopsy or dedicated follow-up.   _________________________________________________________   Interval increased size of previously biopsied (Bethesda category 2) left inferior pole solid thyroid nodule (labeled 5, 3.4 cm, previously 2.5 cm). A nodule remains heterogeneously I so to hyperechoic with scattered spongiform features.   No  cervical lymphadenopathy.   IMPRESSION: 1. Multinodular goiter with interval development of a few additional right-sided thyroid nodules which appear benign and do not warrant additional ultrasound follow-up or tissue sampling. 2. Interval enlargement of previously biopsied left inferior pole solid thyroid nodule (labeled 5) to 3.4 cm from 2.5 cm with similar appearing sonographic features. Recommend correlation with prior biopsy results. 3. Interval decreased size of previously biopsied right mid solid thyroid nodule (labeled 1) to 1.2 cm from 1.8 cm with similar appearing sonographic features. Recommend correlation with prior biopsy results.     FNA right mid nodule 07/15/2016  Benign cytology    FNA left inferior nodule 07/15/2016  Benign cytolofy   ASSESSMENT/PLAN/RECOMMENDATIONS:   Hyperthyroidism:  - Most likely due to toxic thyroid nodules but we also discussed graves' disease as a differential  -TFTs improving -No changes today   Medications : Continue methimazole 5 mg BID    2. MNG:  - No local neck symptoms - She has a pending Ultrasound on 12/28th  - She is S/P benign FNA of the right and left thyroid nodules in 2017 -Most recent thyroid  ultrasound shows increase in size of the left thyroid nodule, would recommend repeat FNA -Patient in agreement of this and order has been placed  3. Elevated  [place] Phosphatase :  -We discussed differential diagnosis includes thionamides therapy versus increased bone resorption -GGT elevated, and I suspect this is more due to thionamides therapy -We will continue to monitor at this time, we will discontinue methimazole and discussed long-term treatment options if alk phos > 3x upper limit of normal  F/U in 4 months  Labs in 8 weeks   Signed electronically by: Mack Guise, MD  [facility] Endocrin[facility]  [facility] [address] [place], [place] [address] Phone: [phone] FAX: [phone]   CC: Minette Brine, [address] [place] [address] Phone: [phone] Fax: [phone]   Return to Endocrinology clinic as below: Future Appointments  Date Time Provider [place]  04/19/2022  3:00 PM Esaiah Wanless, Melanie Crazier, MD [facility] None  08/02/2022  8:20 AM Minette Brine, FNP [facility] None  11/23/2022  8:40 AM Minette Brine, FNP [facility] None

## 2022-04-20 ENCOUNTER — Telehealth: Payer: Self-pay | Admitting: Internal Medicine

## 2022-04-20 LAB — TSH: TSH: 11.58 u[IU]/mL — ABNORMAL HIGH (ref 0.35–5.50)

## 2022-04-20 LAB — T3: T3, Total: 98 ng/dL (ref 76–181)

## 2022-04-20 LAB — T4, FREE: Free T4: 0.56 ng/dL — ABNORMAL LOW (ref 0.60–1.60)

## 2022-04-20 NOTE — Telephone Encounter (Signed)
Patient notified and verbalized understanding. Lab visit scheduled for 05/11/22

## 2022-04-20 NOTE — Telephone Encounter (Signed)
Please let the pt know that her current dose of methimazole is too much and needs to reduce it to one tablet  MOnday through Saturday ( Skip Sundays from now on )   Please schedule her for repeat labs in 3 weeks     Thanks

## 2022-04-28 DIAGNOSIS — H538 Other visual disturbances: Secondary | ICD-10-CM | POA: Diagnosis not present

## 2022-04-29 DIAGNOSIS — Z6822 Body mass index (BMI) 22.0-22.9, adult: Secondary | ICD-10-CM | POA: Diagnosis not present

## 2022-04-29 DIAGNOSIS — Z01419 Encounter for gynecological examination (general) (routine) without abnormal findings: Secondary | ICD-10-CM | POA: Diagnosis not present

## 2022-05-11 ENCOUNTER — Other Ambulatory Visit (INDEPENDENT_AMBULATORY_CARE_PROVIDER_SITE_OTHER): Payer: BC Managed Care – PPO

## 2022-05-11 DIAGNOSIS — E0590 Thyrotoxicosis, unspecified without thyrotoxic crisis or storm: Secondary | ICD-10-CM | POA: Diagnosis not present

## 2022-05-11 LAB — TSH: TSH: 6.9 u[IU]/mL — ABNORMAL HIGH (ref 0.35–5.50)

## 2022-05-11 LAB — T4, FREE: Free T4: 0.65 ng/dL (ref 0.60–1.60)

## 2022-05-12 LAB — T3: T3, Total: 93 ng/dL (ref 76–181)

## 2022-05-17 ENCOUNTER — Other Ambulatory Visit: Payer: Self-pay

## 2022-05-17 DIAGNOSIS — N183 Chronic kidney disease, stage 3 unspecified: Secondary | ICD-10-CM

## 2022-05-18 LAB — CBC WITH DIFFERENTIAL/PLATELET
Basophils Absolute: 0.1 10*3/uL (ref 0.0–0.2)
Basos: 1 %
EOS (ABSOLUTE): 0.2 10*3/uL (ref 0.0–0.4)
Eos: 3 %
Hematocrit: 33.3 % — ABNORMAL LOW (ref 34.0–46.6)
Hemoglobin: 10.5 g/dL — ABNORMAL LOW (ref 11.1–15.9)
Immature Grans (Abs): 0 10*3/uL (ref 0.0–0.1)
Immature Granulocytes: 0 %
Lymphocytes Absolute: 3 10*3/uL (ref 0.7–3.1)
Lymphs: 39 %
MCH: 24.9 pg — ABNORMAL LOW (ref 26.6–33.0)
MCHC: 31.5 g/dL (ref 31.5–35.7)
MCV: 79 fL (ref 79–97)
Monocytes Absolute: 0.3 10*3/uL (ref 0.1–0.9)
Monocytes: 4 %
Neutrophils Absolute: 4.2 10*3/uL (ref 1.4–7.0)
Neutrophils: 53 %
Platelets: 194 10*3/uL (ref 150–450)
RBC: 4.22 x10E6/uL (ref 3.77–5.28)
RDW: 14.2 % (ref 11.7–15.4)
WBC: 7.8 10*3/uL (ref 3.4–10.8)

## 2022-05-18 LAB — RENAL FUNCTION PANEL
Albumin: 4.9 g/dL (ref 3.8–4.9)
BUN/Creatinine Ratio: 21 (ref 9–23)
BUN: 30 mg/dL — ABNORMAL HIGH (ref 6–24)
CO2: 21 mmol/L (ref 20–29)
Calcium: 9.4 mg/dL (ref 8.7–10.2)
Chloride: 103 mmol/L (ref 96–106)
Creatinine, Ser: 1.44 mg/dL — ABNORMAL HIGH (ref 0.57–1.00)
Glucose: 81 mg/dL (ref 70–99)
Phosphorus: 3.7 mg/dL (ref 3.0–4.3)
Potassium: 5 mmol/L (ref 3.5–5.2)
Sodium: 139 mmol/L (ref 134–144)
eGFR: 42 mL/min/{1.73_m2} — ABNORMAL LOW (ref >59)

## 2022-05-18 LAB — IRON,TIBC AND FERRITIN PANEL
Ferritin: 120 ng/mL (ref 15–150)
Iron Saturation: 14 % — ABNORMAL LOW (ref 15–55)
Iron: 46 ug/dL (ref 27–159)
Total Iron Binding Capacity: 324 ug/dL (ref 250–450)
UIBC: 278 ug/dL (ref 131–425)

## 2022-05-18 LAB — FOLATE: Folate: 17.1 ng/mL (ref >3.0)

## 2022-05-18 LAB — PROTEIN / CREATININE RATIO, URINE
Creatinine, Urine: 59.2 mg/dL
Protein, Ur: 22 mg/dL
Protein/Creat Ratio: 372 mg/g creat — ABNORMAL HIGH (ref 0–200)

## 2022-05-18 LAB — VITAMIN B12: Vitamin B-12: 491 pg/mL (ref 232–1245)

## 2022-05-20 DIAGNOSIS — R801 Persistent proteinuria, unspecified: Secondary | ICD-10-CM | POA: Diagnosis not present

## 2022-05-20 DIAGNOSIS — R3121 Asymptomatic microscopic hematuria: Secondary | ICD-10-CM | POA: Diagnosis not present

## 2022-05-20 DIAGNOSIS — D509 Iron deficiency anemia, unspecified: Secondary | ICD-10-CM | POA: Diagnosis not present

## 2022-05-20 DIAGNOSIS — N183 Chronic kidney disease, stage 3 unspecified: Secondary | ICD-10-CM | POA: Diagnosis not present

## 2022-06-08 ENCOUNTER — Encounter (HOSPITAL_COMMUNITY): Payer: Self-pay | Admitting: *Deleted

## 2022-06-08 ENCOUNTER — Emergency Department (HOSPITAL_COMMUNITY): Payer: BC Managed Care – PPO

## 2022-06-08 ENCOUNTER — Emergency Department (HOSPITAL_COMMUNITY)
Admission: EM | Admit: 2022-06-08 | Discharge: 2022-06-08 | Disposition: A | Discharge status: Home / Self Care | Payer: BC Managed Care – PPO | Attending: Emergency Medicine | Admitting: Emergency Medicine

## 2022-06-08 ENCOUNTER — Other Ambulatory Visit: Payer: Self-pay

## 2022-06-08 DIAGNOSIS — M47812 Spondylosis without myelopathy or radiculopathy, cervical region: Secondary | ICD-10-CM | POA: Diagnosis not present

## 2022-06-08 DIAGNOSIS — G9389 Other specified disorders of brain: Secondary | ICD-10-CM | POA: Diagnosis not present

## 2022-06-08 DIAGNOSIS — M5412 Radiculopathy, cervical region: Secondary | ICD-10-CM | POA: Diagnosis not present

## 2022-06-08 DIAGNOSIS — I10 Essential (primary) hypertension: Secondary | ICD-10-CM | POA: Diagnosis not present

## 2022-06-08 DIAGNOSIS — R200 Anesthesia of skin: Secondary | ICD-10-CM | POA: Diagnosis not present

## 2022-06-08 HISTORY — DX: Disorder of thyroid, unspecified: E07.9

## 2022-06-08 LAB — CBC WITH DIFFERENTIAL/PLATELET
Abs Immature Granulocytes: 0.04 10*3/uL (ref 0.00–0.07)
Basophils Absolute: 0.1 10*3/uL (ref 0.0–0.1)
Basophils Relative: 1 %
Eosinophils Absolute: 0.2 10*3/uL (ref 0.0–0.5)
Eosinophils Relative: 2 %
HCT: 32.3 % — ABNORMAL LOW (ref 36.0–46.0)
Hemoglobin: 10.7 g/dL — ABNORMAL LOW (ref 12.0–15.0)
Immature Granulocytes: 1 %
Lymphocytes Relative: 27 %
Lymphs Abs: 2.4 10*3/uL (ref 0.7–4.0)
MCH: 25.8 pg — ABNORMAL LOW (ref 26.0–34.0)
MCHC: 33.1 g/dL (ref 30.0–36.0)
MCV: 77.8 fL — ABNORMAL LOW (ref 80.0–100.0)
Monocytes Absolute: 0.4 10*3/uL (ref 0.1–1.0)
Monocytes Relative: 4 %
Neutro Abs: 5.8 10*3/uL (ref 1.7–7.7)
Neutrophils Relative %: 65 %
Platelets: 180 10*3/uL (ref 150–400)
RBC: 4.15 MIL/uL (ref 3.87–5.11)
RDW: 13.6 % (ref 11.5–15.5)
WBC: 8.9 10*3/uL (ref 4.0–10.5)
nRBC: 0 % (ref 0.0–0.2)

## 2022-06-08 LAB — TROPONIN I (HIGH SENSITIVITY)
Troponin I (High Sensitivity): 2 ng/L (ref <18)
Troponin I (High Sensitivity): <2 ng/L (ref <18)

## 2022-06-08 LAB — COMPREHENSIVE METABOLIC PANEL
ALT: 17 U/L (ref 0–44)
AST: 22 U/L (ref 15–41)
Albumin: 4.3 g/dL (ref 3.5–5.0)
Alkaline Phosphatase: 83 U/L (ref 38–126)
Anion gap: 6 (ref 5–15)
BUN: 34 mg/dL — ABNORMAL HIGH (ref 6–20)
CO2: 24 mmol/L (ref 22–32)
Calcium: 9.4 mg/dL (ref 8.9–10.3)
Chloride: 111 mmol/L (ref 98–111)
Creatinine, Ser: 1.58 mg/dL — ABNORMAL HIGH (ref 0.44–1.00)
GFR, Estimated: 37 mL/min — ABNORMAL LOW (ref >60)
Glucose, Bld: 99 mg/dL (ref 70–99)
Potassium: 4.4 mmol/L (ref 3.5–5.1)
Sodium: 141 mmol/L (ref 135–145)
Total Bilirubin: 0.5 mg/dL (ref 0.3–1.2)
Total Protein: 7.8 g/dL (ref 6.5–8.1)

## 2022-06-08 NOTE — ED Triage Notes (Signed)
Left arm pain 2 am with squeezing and hand felt like a "claw" numbness in in left side including face. Slight numbness without any other deficits. S/o soreness to her neck. Numbness continues.

## 2022-06-08 NOTE — ED Provider Notes (Signed)
[facility] DEPT Provider Note   CSN: [id number] Arrival date & time: 06/08/22  1114     History  Chief Complaint  Patient presents with   Numbness    Aimee Austin is a 61 y.o. female.  Patient is a 61 year old female with a history of renal disorder, RA, thyroid disease and anemia who is presenting today with complaints of left-sided symptoms.  Patient reports yesterday she had noticed some numbness in the left side of her face but went to bed and when she woke up around 2 AM noticed that her left arm felt numb, heavy and sore as well as some soreness in her left hip.  She initially reports that she was unable to get out of bed this morning.  She reports that the symptoms in her leg have pretty much resolved but her arm still feels heavy, weak and a little bit sore mostly in her neck area.  She has not had any difficulty speaking, swallowing.  Her vision has been the same.  She has had 1 change in her kidney medication in the last month but no other acute changes.  Patient reports she did have similar episode in January of this year.  At that time she had a negative CTA and nonspecific MRI and it was felt to be related to her thyroid.  She is getting her thyroid now checked regularly and has not missed any doses of her medication.  She denies any infectious symptoms, recent trauma or any prior issues with her neck.  The history is provided by the patient.       Home Medications Prior to Admission medications   Medication Sig Start Date End Date Taking? Authorizing Provider  albuterol (VENTOLIN HFA) 108 (90 Base) MCG/ACT inhaler Inhale 2 puffs into the lungs every 6 (six) hours as needed for wheezing or shortness of breath. 03/30/22   Minette Brine, FNP  Ferrous Sulfate (IRON PO) Take by mouth daily.    [provider]  fluocinonide cream (LIDEX) 9.62 % Apply 1 application topically 2 (two) times daily. 09/28/21   [provider]   fluocinonide-emollient (LIDEX-E) 0.05 % cream Apply 1 Application topically 3 (three) times daily. 03/30/22   Minette Brine, FNP  fluticasone (FLONASE) 50 MCG/ACT nasal spray Place 2 sprays into both nostrils daily. 10/19/21 10/19/22  Minette Brine, FNP  hydrOXYzine (ATARAX/VISTARIL) 25 MG tablet Take 1 tablet (25 mg total) by mouth as needed. 11/12/20   Ghumman, Ramandeep, NP  lisinopril (ZESTRIL) 10 MG tablet Take 1 tablet (10 mg total) by mouth daily. 03/30/22   Minette Brine, FNP  methimazole (TAPAZOLE) 5 MG tablet Take 1 tab by mouth in am and 1/2 tablet by mouth pm 03/30/22   Minette Brine, FNP  Nutritional Supplements (VITAMIN D BOOSTER PO) Take 1 tablet by mouth daily.    [provider]      Allergies    Shellfish allergy and Sulfa antibiotics    Review of Systems   Review of Systems  Physical Exam Updated Vital Signs BP 134/78   Pulse 75   Temp 98 F (36.7 C)   Resp 20   Ht 5' 5.5" (1.664 m)   Wt 63.5 kg   SpO2 100%   BMI 22.94 kg/m  Physical Exam Vitals and nursing note reviewed.  Constitutional:      General: She is not in acute distress.    Appearance: She is well-developed.  HENT:     Head: Normocephalic and atraumatic.  Eyes:     General: No visual field deficit.    Pupils: Pupils are equal, round, and reactive to light.  Neck:   Cardiovascular:     Rate and Rhythm: Normal rate and regular rhythm.     Heart sounds: Normal heart sounds. No murmur heard.    No friction rub.  Pulmonary:     Effort: Pulmonary effort is normal.     Breath sounds: Normal breath sounds. No wheezing or rales.  Abdominal:     General: Bowel sounds are normal. There is no distension.     Palpations: Abdomen is soft.     Tenderness: There is no abdominal tenderness. There is no guarding or rebound.  Musculoskeletal:        General: Normal range of motion.     Cervical back: Normal range of motion and neck supple. Tenderness present.     Comments: No edema  Skin:     General: Skin is warm and dry.     Findings: No rash.  Neurological:     Mental Status: She is alert and oriented to person, place, and time.     Cranial Nerves: No dysarthria or facial asymmetry.     Motor: Motor function is intact. No weakness or pronator drift.     Coordination: Coordination is intact. Finger-Nose-Finger Test and Heel to Harwood Test normal.     Gait: Gait is intact.     Comments: Patient reports decreased sensation in the left cheek and forehead  Psychiatric:        Behavior: Behavior normal.     ED Results / Procedures / Treatments   Labs (all labs ordered are listed, but only abnormal results are displayed) Labs Reviewed  CBC WITH DIFFERENTIAL/PLATELET - Abnormal; Notable for the following components:      Result Value   Hemoglobin 10.7 (*)    HCT 32.3 (*)    MCV 77.8 (*)    MCH 25.8 (*)    All other components within normal limits  COMPREHENSIVE METABOLIC PANEL - Abnormal; Notable for the following components:   BUN 34 (*)    Creatinine, Ser 1.58 (*)    GFR, Estimated 37 (*)    All other components within normal limits  TROPONIN I (HIGH SENSITIVITY)  TROPONIN I (HIGH SENSITIVITY)    EKG EKG Interpretation  Date/Time:  Wednesday June 08 2022 14:25:19 EDT Ventricular Rate:  76 PR Interval:  184 QRS Duration: 94 QT Interval:  377 QTC Calculation: 424 R Axis:   81 Text Interpretation: Sinus rhythm Borderline ST elevation, lateral leads No significant change since last tracing Confirmed by Blanchie Dessert [phone]) on 06/08/2022 2:34:37 PM  Radiology MR Cervical Spine Wo Contrast  Result Date: 06/08/2022 CLINICAL DATA:  Myelopathy, acute, cervical spine EXAM: MRI CERVICAL SPINE WITHOUT CONTRAST TECHNIQUE: Multiplanar, multisequence MR imaging of the cervical spine was performed. No intravenous contrast was administered. COMPARISON:  CT 10/05/2021.  Ultrasound 09/01/2021 FINDINGS: Alignment: Trace retrolisthesis of C5 on C6. Vertebrae: No fracture,  evidence of discitis, or bone lesion. Discogenic endplate marrow changes at C5-6. Superior endplate Schmorl's node of T1. Cord: Normal signal and morphology. Posterior Fossa, vertebral arteries, paraspinal tissues: See dedicated MRI brain for evaluation of the posterior fossa. Vertebral artery flow voids are maintained. Multinodular thyroid gland, assessed by dedicated ultrasound on 09/01/2021 with subsequent FNA on 12/20/2021. Disc levels: C2-C3: No significant disc protrusion, foraminal stenosis, or canal stenosis. C3-C4: Minimal disc bulge. Left-sided facet arthropathy. Borderline-mild left foraminal stenosis. No canal stenosis.  C4-C5: Minimal disc osteophyte complex and mild facet arthropathy. No significant foraminal or canal stenosis. C5-C6: Retrolisthesis with mild endplate spurring. Bilateral facet arthropathy and uncovertebral spurring. Mild canal stenosis with mild bilateral foraminal stenosis, right greater than left. C6-C7: Minimal disc osteophyte complex. No canal stenosis. Mild bilateral foraminal stenosis. C7-T1: No significant disc protrusion, foraminal stenosis, or canal stenosis. IMPRESSION: 1. Mild cervical spondylosis greatest at the C5-6 level where there is mild canal stenosis and mild bilateral foraminal stenosis. 2. Mild bilateral foraminal stenosis at C6-7. Electronically Signed   By: Davina Poke D.O.   On: 06/08/2022 14:20    Procedures Procedures    Medications Ordered in ED Medications - No data to display  ED Course/ Medical Decision Making/ A&P                           Medical Decision Making Amount and/or Complexity of Data Reviewed External Data Reviewed: notes. Labs: ordered. Decision-making details documented in ED Course. Radiology: ordered and independent interpretation performed. Decision-making details documented in ED Course. ECG/medicine tests: ordered and independent interpretation performed. Decision-making details documented in ED Course.   Pt with  multiple medical problems and comorbidities and presenting today with a complaint that caries a high risk for morbidity and mortality.  Here today with complaint of numbness to the left side of face, left upper extremity and pain to the left upper extremity and left lower extremity.  She reports the symptoms in her leg have pretty much resolved but still having symptoms in her face and arm.  Patient has no infectious symptoms.  Vascularly intact.  No chest pain or shortness of breath concerning for acute cardiac cause.  Patient does report history of something similar in January.  At that time patient had a CTA of her brain that showed no evidence of vascular defects.  She was found to have hyperthyroidism and has been on methimazole since then with close thyroid monitoring.  Based on patient's exam today feel that she needs MRI to rule out acute stroke but also doing a cervical spine to ensure there is no lesions in the spine causing her symptoms.  Also will ensure no evidence of electrolyte abnormalities that might be the cause for her symptoms.  She reports overall they are improving but still not gone.  I independently interpreted patient's labs and EKG today.  EKG without acute findings.  CBC unchanged, CMP with creatinine 1.58 which is unchanged from prior with normal electrolytes and troponin is negative.  MRI is pending.  3:26 PM I have independently visualized and interpreted pt's images today.  MRI without evidence of acute bleed or stroke.  Radiology reports that MRI cervical spine shows mild cervical spondylosis greatest at the C5-6 level with mild canal stenosis and bilateral foraminal stenosis.          Final Clinical Impression(s) / ED Diagnoses Final diagnoses:  Cervical radiculopathy    Rx / DC Orders ED Discharge Orders          Ordered    Ambulatory referral to Neurology       Comments: An appointment is requested in approximately: 2 weeks.  Demyelinating lesion of the brain    06/08/22 1525              Blanchie Dessert, MD 06/08/22 1526

## 2022-06-08 NOTE — Discharge Instructions (Signed)
No signs of a stroke today.  Your blood work looks good.  You do have some arthritis in your neck and maybe have some mild nerves being pinched which might be what is causing your arm symptoms.  Neurology will call you for a follow-up.  You do have a small spot on your brain which has not changed since January but they did recommend follow-up with neurology for that.

## 2022-06-09 ENCOUNTER — Telehealth: Payer: Self-pay

## 2022-06-09 NOTE — Telephone Encounter (Signed)
Transition Care Management Unsuccessful Follow-up Telephone Call  Date of discharge and from where:  06/08/2022  Attempts:  1st Attempt  Reason for unsuccessful TCM follow-up call:  Left voice message

## 2022-06-16 ENCOUNTER — Telehealth: Payer: Self-pay

## 2022-06-16 NOTE — Telephone Encounter (Signed)
Transition Care Management Unsuccessful Follow-up Telephone Call  Date of discharge and from where:  06/08/2022 [facility]   Attempts:  2nd Attempt  Reason for unsuccessful TCM follow-up call:  Left voice message

## 2022-06-17 ENCOUNTER — Telehealth: Payer: Self-pay

## 2022-06-17 NOTE — Telephone Encounter (Signed)
Patient called back after leaving message for her to give the office a call back. Patient stated in vm she has appointment with neurologist on Thursday. She still feels numbess & tingling. Attempted to call patient back, unable to reach pt. Left pt vm to clarify if she needs an appointment with [place] & to give the office a call back to confirm.

## 2022-06-20 ENCOUNTER — Other Ambulatory Visit: Payer: Self-pay | Admitting: Nurse Practitioner

## 2022-06-20 DIAGNOSIS — Z1231 Encounter for screening mammogram for malignant neoplasm of breast: Secondary | ICD-10-CM

## 2022-06-21 NOTE — Progress Notes (Unsigned)
[facility]  PATIENT: Aimee Austin DOB: 01/03/1961  REFERRING DOCTOR OR PCP: Blanchie Dessert, MD SOURCE: Patient, notes from emergency room x2, imaging and lab reports, MRI images personally reviewed.  _________________________________   HISTORICAL  CHIEF COMPLAINT:  Chief Complaint  Patient presents with   New Patient (Initial Visit)    Pt in room #2 and alone. Pt here today for cervical radiculopathy.    HISTORY OF PRESENT ILLNESS:  I had the pleasure seeing your patient, Aimee Austin, at [facility] for neurologic consultation regarding her left arm pain, shoulder pain, muscle spasms and abnormal brain MRI.  She is a 61 year old woman with stiffness and soreness in her left arm.   This started in January and she went to the ED due to concern about stroke.   She had an MRI showing a 10 mm focus in the right posterior frontal region.  She improved over the next 2-3 weeks and then had only minimal left arms sensory changes/allodynia like wearing a baseball mitt.   She then had the onset of similar symptoms early October 2023. And also had more clumsiness in that hand.   When most intense, the pain was in the left biceps and down the arm with a pressure sensation.    She also noted mild hand weakness and reduced coordination.    Compared to 2 weeks ago, the discomfort is better.  She feels her gait is worse and she has had a few falls.   Bladder function is fine.    She has noted some blurry vision this year but a new pair of glasses helped.     Years ago, she was diagnosed with RA due to joint swelling.     She had some episodes where her legs gave out    She was diagnosed with hyperthyroidism  She is reporting pain in both shoulders.  Range of motion is not reduced in the shoulders.  MRI of the brain 10/05/2021 and 06/08/2022 were reviewed.  Both show a single 10 mm ovoid lesion in the posterior left frontal lobe deep white matter.  It does not  enhance or apparent on diffusion-weighted images.  Brain has normal volume.  MRI of the cervical spine 06/08/2022 shows multilevel degenerative changes.  At C5-C6, there is minimal retrolisthesis, facet hypertrophy and uncovertebral spurring causing mild spinal stenosis but no nerve root compression.  CT angiogram of the head and neck 10/05/2021 showed no significant stenosis or other disorder.  A small posterior left frontal focus of hypoattenuation is consistent with the focus seen on MRI.    REVIEW OF SYSTEMS: Constitutional: No fevers, chills, sweats, or change in appetite Eyes: No visual changes, double vision, eye pain Ear, nose and throat: No hearing loss, ear pain, nasal congestion, sore throat Cardiovascular: No chest pain, palpitations Respiratory:  No shortness of breath at rest or with exertion.   No wheezes GastrointestinaI: No nausea, vomiting, diarrhea, abdominal pain, fecal incontinence Genitourinary:  No dysuria, urinary retention or frequency.  No nocturia. Musculoskeletal:  No neck pain, back pain Integumentary: No rash, pruritus, skin lesions Neurological: as above Psychiatric: No depression at this time.  No anxiety Endocrine: No palpitations, diaphoresis, change in appetite, change in weigh or increased thirst Hematologic/Lymphatic:  No anemia, purpura, petechiae. Allergic/Immunologic: No itchy/runny eyes, nasal congestion, recent allergic reactions, rashes  ALLERGIES: Allergies  Allergen Reactions   Shellfish Allergy Anaphylaxis   Sulfa Antibiotics Anaphylaxis    HOME MEDICATIONS:  Current Outpatient Medications:  albuterol (VENTOLIN HFA) 108 (90 Base) MCG/ACT inhaler, Inhale 2 puffs into the lungs every 6 (six) hours as needed for wheezing or shortness of breath., Disp: 8 g, Rfl: 5   Ferrous Sulfate (IRON PO), Take by mouth daily., Disp: , Rfl:    fluocinonide cream (LIDEX) 6.60 %, Apply 1 application  topically as needed., Disp: , Rfl:     fluocinonide-emollient (LIDEX-E) 0.05 % cream, Apply 1 Application topically 3 (three) times daily. (Patient taking differently: Apply 1 Application topically as needed.), Disp: 60 g, Rfl: 1   hydrOXYzine (ATARAX/VISTARIL) 25 MG tablet, Take 1 tablet (25 mg total) by mouth as needed., Disp: 30 tablet, Rfl: 3   lisinopril (ZESTRIL) 10 MG tablet, Take 1 tablet (10 mg total) by mouth daily. (Patient taking differently: Take 20 mg by mouth daily.), Disp: 90 tablet, Rfl: 1   methimazole (TAPAZOLE) 5 MG tablet, Take 1 tab by mouth in am and 1/2 tablet by mouth pm, Disp: 135 tablet, Rfl: 1   Nutritional Supplements (VITAMIN D BOOSTER PO), Take 1 tablet by mouth daily., Disp: , Rfl:   PAST MEDICAL HISTORY: Past Medical History:  Diagnosis Date   Anemia    Arthritis    Rheumatoid Arthritis 2010   Asthma    Eczema    Renal disorder    Strep throat    Thyroid disease     PAST SURGICAL HISTORY: Past Surgical History:  Procedure Laterality Date   ABDOMINAL HYSTERECTOMY  03/1998   Uterus only    FAMILY HISTORY: Family History  Adopted: Yes  Problem Relation Age of Onset   Cancer Mother     SOCIAL HISTORY: Social History   Socioeconomic History   Marital status: Single    Spouse name: Not on file   Number of children: Not on file   Years of education: Not on file   Highest education level: Not on file  Occupational History   Occupation: Control and instrumentation engineer    Comment: Archivist  Tobacco Use   Smoking status: Never   Smokeless tobacco: Never  Vaping Use   Vaping Use: Never used  Substance and Sexual Activity   Alcohol use: No    Alcohol/week: 0.0 standard drinks of alcohol   Drug use: No   Sexual activity: Never  Other Topics Concern   Not on file  Social History Narrative   Lives with daughter and granddaughter. Her daughter is a Marine scientist at [employer].    Social Determinants of Health   Financial Resource Strain: Not on file  Food Insecurity: Not on file   Transportation Needs: Not on file  Physical Activity: Not on file  Stress: Not on file  Social Connections: Not on file  Intimate Partner Violence: Not on file       PHYSICAL EXAM  Vitals:   06/23/22 1257  BP: 126/72  Pulse: 78  Weight: 144 lb 8 oz (65.5 kg)  Height: '5\' 5"'$  (1.651 m)    Body mass index is 24.05 kg/m.   General: The patient is well-developed and well-nourished and in no acute distress.  HEENT:  Head is [place]/AT.  Sclera are anicteric.  Funduscopic exam shows normal optic discs and retinal vessels.  Neck: No carotid bruits are noted.  The neck is nontender.  Cardiovascular: The heart has a regular rate and rhythm with a normal S1 and S2. There were no murmurs, gallops or rubs.    Skin: Extremities are without rash or  edema.  Musculoskeletal:  Back is nontender.  She has mild tenderness over the subacromial bursae bilaterally.  Range of motion was normal in the shoulders.  Neurologic Exam  Mental status: The patient is alert and oriented x 3 at the time of the examination. The patient has apparent normal recent and remote memory, with an apparently normal attention span and concentration ability.   Speech is normal.  Cranial nerves: Extraocular movements are full. Pupils are equal, round, and reactive to light and accomodation.  Visual fields are full.  Facial symmetry is present. There is good facial sensation to soft touch bilaterally.Facial strength is normal.  Trapezius and sternocleidomastoid strength is normal. No dysarthria is noted.  The tongue is midline, and the patient has symmetric elevation of the soft palate. No obvious hearing deficits are noted.  Motor:  Muscle bulk is normal.   Tone is normal. Strength is  5 / 5 in all 4 extremities, possibly 4++ in the left triceps.   Sensory: Sensory testing is intact to pinprick, soft touch and vibration sensation in all 4 extremities.  Coordination: Cerebellar testing reveals good finger-nose-finger and  heel-to-shin bilaterally.  Gait and station: Station is normal.   Gait is normal. Tandem gait is normal. Romberg is negative.   Reflexes: Deep tendon reflexes are symmetric and normal bilaterally.   Plantar responses are flexor.    DIAGNOSTIC DATA (LABS, IMAGING, TESTING) - I reviewed patient records, labs, notes, testing and imaging myself where available.  Lab Results  Component Value Date   WBC 8.9 06/08/2022   HGB 10.7 (L) 06/08/2022   HCT 32.3 (L) 06/08/2022   MCV 77.8 (L) 06/08/2022   PLT 180 06/08/2022      Component Value Date/Time   NA 141 06/08/2022 1218   NA 139 05/17/2022 1650   K 4.4 06/08/2022 1218   CL 111 06/08/2022 1218   CO2 24 06/08/2022 1218   GLUCOSE 99 06/08/2022 1218   BUN 34 (H) 06/08/2022 1218   BUN 30 (H) 05/17/2022 1650   CREATININE 1.58 (H) 06/08/2022 1218   CALCIUM 9.4 06/08/2022 1218   PROT 7.8 06/08/2022 1218   PROT 7.4 04/04/2022 0850   ALBUMIN 4.3 06/08/2022 1218   ALBUMIN 4.9 05/17/2022 1650   AST 22 06/08/2022 1218   ALT 17 06/08/2022 1218   ALKPHOS 83 06/08/2022 1218   BILITOT 0.5 06/08/2022 1218   BILITOT <0.2 04/04/2022 0850   GFRNONAA 37 (L) 06/08/2022 1218   GFRAA 64 02/20/2020 1018   Lab Results  Component Value Date   CHOL 250 (H) 04/04/2022   HDL 47 04/04/2022   LDLCALC 175 (H) 04/04/2022   TRIG 155 (H) 04/04/2022   CHOLHDL 5.3 (H) 04/04/2022   Lab Results  Component Value Date   HGBA1C 5.6 04/04/2022   Lab Results  Component Value Date   VITAMINB12 491 05/17/2022   Lab Results  Component Value Date   TSH 6.90 (H) 05/11/2022       ASSESSMENT AND PLAN  White matter abnormality on MRI of brain - Plan: Anti-MOG, Serum, ANCA Profile  Multinodular goiter  Dysesthesia - Plan: Anti-MOG, Serum, ANCA Profile  Degeneration, intervertebral disc, cervical  Chronic left shoulder pain - Plan: Rheumatoid factor, ANCA Profile  Muscle pain  In summary, Aimee Austin is a 61 year old woman with left arm pain,  bilateral shoulder pain, arm numbness and an abnormal brain MRI.  Her symptoms fluctuate and currently she has mild discomfort in the left arm but more discomfort in the muscles around her shoulders and in the shoulders  themselves.  Etiology of symptoms is not clear.  The single spot in the brain MRI is nonspecific and is on the wrong side to cause left arm sensory symptoms.  Therefore, it is probably unrelated.  I would have her try a muscle relaxant, Flexeril 5 mg p.o. 3 times daily, if this does not help consider adding an anti-inflammatory agent.   I will check some lab work for inflammatory conditions that may be contributing.  I did not schedule follow-up but she is advised to call if she has new or worsening neurologic symptoms.  Thank you for asking me to see Aimee Austin.  Please let me know if I can be of further assistance with her or other patients in the future.  Caylyn Tedeschi A. Felecia Shelling, MD, [facility] [id number], 0:35 PM Certified in Neurology, Clinical Neurophysiology, Sleep Medicine and Neuroimaging  [facility] [address] [place], [place] [address] [phone]

## 2022-06-23 ENCOUNTER — Encounter: Payer: Self-pay | Admitting: Neurology

## 2022-06-23 ENCOUNTER — Ambulatory Visit (INDEPENDENT_AMBULATORY_CARE_PROVIDER_SITE_OTHER): Payer: BC Managed Care – PPO | Admitting: Neurology

## 2022-06-23 VITALS — BP 126/72 | HR 78 | Ht 65.0 in | Wt 144.5 lb

## 2022-06-23 DIAGNOSIS — M25512 Pain in left shoulder: Secondary | ICD-10-CM

## 2022-06-23 DIAGNOSIS — G8929 Other chronic pain: Secondary | ICD-10-CM

## 2022-06-23 DIAGNOSIS — M5030 Other cervical disc degeneration, unspecified cervical region: Secondary | ICD-10-CM

## 2022-06-23 DIAGNOSIS — M791 Myalgia, unspecified site: Secondary | ICD-10-CM

## 2022-06-23 DIAGNOSIS — E042 Nontoxic multinodular goiter: Secondary | ICD-10-CM

## 2022-06-23 DIAGNOSIS — R208 Other disturbances of skin sensation: Secondary | ICD-10-CM

## 2022-06-23 DIAGNOSIS — R9082 White matter disease, unspecified: Secondary | ICD-10-CM | POA: Diagnosis not present

## 2022-06-23 MED ORDER — GABAPENTIN 300 MG PO CAPS: 300.0000 mg | ORAL_CAPSULE | Freq: Three times a day (TID) | ORAL | 11 refills | Status: DC

## 2022-06-23 MED ORDER — CYCLOBENZAPRINE HCL 5 MG PO TABS: 5.0000 mg | ORAL_TABLET | Freq: Three times a day (TID) | ORAL | 3 refills | Status: DC

## 2022-06-25 LAB — RHEUMATOID FACTOR: Rheumatoid fact SerPl-aCnc: 13.7 IU/mL (ref <14.0)

## 2022-06-25 LAB — ANCA PROFILE
Anti-MPO Antibodies: <0.2 units (ref 0.0–0.9)
Anti-PR3 Antibodies: <0.2 units (ref 0.0–0.9)
Atypical pANCA: <1:20 {titer}
C-ANCA: <1:20 {titer}
P-ANCA: <1:20 {titer}

## 2022-06-25 LAB — ANTI-MOG, SERUM: MOG Antibody, Cell-based IFA: NEGATIVE

## 2022-08-02 ENCOUNTER — Ambulatory Visit (INDEPENDENT_AMBULATORY_CARE_PROVIDER_SITE_OTHER): Payer: BC Managed Care – PPO | Admitting: Nurse Practitioner

## 2022-08-02 ENCOUNTER — Encounter: Payer: Self-pay | Admitting: Nurse Practitioner

## 2022-08-02 VITALS — BP 128/68 | Temp 98.1°F | Ht 65.0 in | Wt 146.0 lb

## 2022-08-02 DIAGNOSIS — I10 Essential (primary) hypertension: Secondary | ICD-10-CM | POA: Diagnosis not present

## 2022-08-02 DIAGNOSIS — H6121 Impacted cerumen, right ear: Secondary | ICD-10-CM

## 2022-08-02 DIAGNOSIS — R0989 Other specified symptoms and signs involving the circulatory and respiratory systems: Secondary | ICD-10-CM | POA: Diagnosis not present

## 2022-08-02 DIAGNOSIS — E782 Mixed hyperlipidemia: Secondary | ICD-10-CM

## 2022-08-02 DIAGNOSIS — Z2821 Immunization not carried out because of patient refusal: Secondary | ICD-10-CM

## 2022-08-02 DIAGNOSIS — N281 Cyst of kidney, acquired: Secondary | ICD-10-CM

## 2022-08-02 DIAGNOSIS — Z872 Personal history of diseases of the skin and subcutaneous tissue: Secondary | ICD-10-CM

## 2022-08-02 DIAGNOSIS — Z8709 Personal history of other diseases of the respiratory system: Secondary | ICD-10-CM

## 2022-08-02 DIAGNOSIS — E0590 Thyrotoxicosis, unspecified without thyrotoxic crisis or storm: Secondary | ICD-10-CM

## 2022-08-02 HISTORY — DX: Essential (primary) hypertension: I10

## 2022-08-02 MED ORDER — ALBUTEROL SULFATE HFA 108 (90 BASE) MCG/ACT IN AERS: 2.0000 | INHALATION_SPRAY | Freq: Four times a day (QID) | RESPIRATORY_TRACT | 5 refills | Status: AC | PRN

## 2022-08-02 MED ORDER — FLUOCINONIDE EMULSIFIED BASE 0.05 % EX CREA: 1.0000 | TOPICAL_CREAM | Freq: Three times a day (TID) | CUTANEOUS | 1 refills | Status: AC

## 2022-08-02 MED ORDER — HYDROXYZINE HCL 25 MG PO TABS: 25.0000 mg | ORAL_TABLET | ORAL | 3 refills | Status: AC | PRN

## 2022-08-02 MED ORDER — LISINOPRIL 20 MG PO TABS: 20.0000 mg | ORAL_TABLET | Freq: Every day | ORAL | 1 refills | Status: AC

## 2022-08-02 NOTE — Progress Notes (Signed)
I,Tianna Badgett,acting as a Education administrator for [employer], FNP.,have documented all relevant documentation on the behalf of Minette Brine, FNP,as directed by  Minette Brine, FNP while in the presence of Minette Brine, [place]. Subjective:     Patient ID: Aimee Austin , female    DOB: February 04, 1961 , 61 y.o.   MRN: [id number]   Chief Complaint  Patient presents with   Hypertension    HPI  She presents today for her 4 month HTN and cholesterol follow up. She is being followed by Endocrinology for her hyperthyroid, next appt in January. She is due to see Nephrology in December.   Hypertension This is a chronic problem. The current episode started more than 1 year ago. The problem is unchanged. The problem is controlled. Pertinent negatives include no chest pain, palpitations or shortness of breath. There are no associated agents to hypertension. Risk factors for coronary artery disease include dyslipidemia. Past treatments include calcium channel blockers and central alpha agonists. The current treatment provides moderate improvement. There is no history of chronic renal disease.  Hyperlipidemia This is a chronic problem. The current episode started more than 1 year ago. The problem is uncontrolled. She has no history of chronic renal disease. There are no known factors aggravating her hyperlipidemia. Pertinent negatives include no chest pain or shortness of breath. There are no compliance problems.  Risk factors for coronary artery disease include dyslipidemia.     Past Medical History:  Diagnosis Date   Anemia    Arthritis    Rheumatoid Arthritis 2010   Asthma    Eczema    Essential hypertension, benign 08/02/2022   Renal disorder    Strep throat    Thyroid disease      Family History  Adopted: Yes  Problem Relation Age of Onset   Cancer Mother      Current Outpatient Medications:    albuterol (VENTOLIN HFA) 108 (90 Base) MCG/ACT inhaler, Inhale 2 puffs into the lungs every 6 (six) hours as  needed for wheezing or shortness of breath., Disp: 8 g, Rfl: 5   Ferrous Sulfate (IRON PO), Take by mouth daily., Disp: , Rfl:    methimazole (TAPAZOLE) 5 MG tablet, Take 1 tab by mouth in am and 1/2 tablet by mouth pm, Disp: 135 tablet, Rfl: 1   Nutritional Supplements (VITAMIN D BOOSTER PO), Take 1 tablet by mouth daily., Disp: , Rfl:    fluocinonide-emollient (LIDEX-E) 0.05 % cream, Apply 1 Application topically 3 (three) times daily., Disp: 60 g, Rfl: 1   hydrOXYzine (ATARAX) 25 MG tablet, Take 1 tablet (25 mg total) by mouth as needed., Disp: 30 tablet, Rfl: 3   lisinopril (ZESTRIL) 20 MG tablet, Take 1 tablet (20 mg total) by mouth daily., Disp: 90 tablet, Rfl: 1   Allergies  Allergen Reactions   Shellfish Allergy Anaphylaxis   Sulfa Antibiotics Anaphylaxis     Review of Systems  Constitutional: Negative.   HENT:  Positive for congestion (chest congestion) and rhinorrhea.   Respiratory:  Positive for cough. Negative for shortness of breath and wheezing.   Cardiovascular: Negative.  Negative for chest pain and palpitations.  Gastrointestinal: Negative.   Neurological: Negative.   Psychiatric/Behavioral: Negative.       Today's Vitals   08/02/22 0832  BP: 128/68  Temp: 98.1 F (36.7 C)  TempSrc: Oral  Weight: 146 lb (66.2 kg)  Height: '5\' 5"'$  (1.651 m)   Body mass index is 24.3 kg/m.  Wt Readings from Last 3 Encounters:  08/02/22 146 lb (66.2 kg)  06/23/22 144 lb 8 oz (65.5 kg)  06/08/22 140 lb (63.5 kg)    Objective:  Physical Exam Vitals reviewed.  Constitutional:      General: She is not in acute distress.    Appearance: Normal appearance.  Cardiovascular:     Rate and Rhythm: Normal rate and regular rhythm.     Pulses: Normal pulses.     Heart sounds: Normal heart sounds. No murmur heard. Pulmonary:     Effort: Pulmonary effort is normal. No respiratory distress.     Breath sounds: Normal breath sounds. No wheezing.  Musculoskeletal:        General: No  swelling or tenderness. Normal range of motion.  Skin:    General: Skin is warm and dry.     Capillary Refill: Capillary refill takes less than 2 seconds.  Neurological:     General: No focal deficit present.     Mental Status: She is alert and oriented to person, place, and time.     Cranial Nerves: No cranial nerve deficit.     Motor: No weakness.  Psychiatric:        Mood and Affect: Mood is not anxious.        Behavior: Behavior normal.        Thought Content: Thought content normal.        Judgment: Judgment normal.         Assessment And Plan:     1. Essential hypertension, benign Comments: Blood pressure is well controlled, continue current medications - lisinopril (ZESTRIL) 20 MG tablet; Take 1 tablet (20 mg total) by mouth daily.  Dispense: 90 tablet; Refill: 1  2. Mixed hyperlipidemia Comments: Stable, diet controlled. Continue to focus on a low fat diet - Lipid panel  3. Hyperthyroidism Comments: Continue f/u with Endocrinology  4. History of eczema - hydrOXYzine (ATARAX) 25 MG tablet; Take 1 tablet (25 mg total) by mouth as needed.  Dispense: 30 tablet; Refill: 3 - fluocinonide-emollient (LIDEX-E) 0.05 % cream; Apply 1 Application topically 3 (three) times daily.  Dispense: 60 g; Refill: 1  5. Chest congestion Comments: Has been ongoing for 6 days. Lung sounds are clear. No additional symptoms other than runny nose started at office but stopped.  6. Impacted cerumen of right ear Comments: Advised to use Debrox drops to clean ears. Did not clean today due to mild runny nose  7. Acquired complex renal cyst Comments: Continue f/u with Nephrology, she will have her labs done next week  8. Influenza vaccination declined     Patient was given opportunity to ask questions. Patient verbalized understanding of the plan and was able to repeat key elements of the plan. All questions were answered to their satisfaction.  Minette Brine, FNP   I, Minette Brine, FNP,  have reviewed all documentation for this visit. The documentation on 08/02/22 for the exam, diagnosis, procedures, and orders are all accurate and complete.   IF YOU HAVE BEEN REFERRED TO A SPECIALIST, IT MAY TAKE 1-2 WEEKS TO SCHEDULE/PROCESS THE REFERRAL. IF YOU HAVE NOT HEARD FROM US/SPECIALIST IN TWO WEEKS, PLEASE GIVE [place] A CALL AT [phone].   THE PATIENT IS ENCOURAGED TO PRACTICE SOCIAL DISTANCING DUE TO THE COVID-19 PANDEMIC.

## 2022-08-02 NOTE — Patient Instructions (Addendum)

## 2022-08-02 NOTE — Addendum Note (Signed)
Addended by: Minette Brine F on: 08/02/2022 02:03 PM   Modules accepted: Orders

## 2022-08-03 ENCOUNTER — Other Ambulatory Visit: Payer: Self-pay | Admitting: Nurse Practitioner

## 2022-08-03 DIAGNOSIS — E782 Mixed hyperlipidemia: Secondary | ICD-10-CM

## 2022-08-03 LAB — LIPID PANEL
Chol/HDL Ratio: 5.5 ratio — ABNORMAL HIGH (ref 0.0–4.4)
Cholesterol, Total: 247 mg/dL — ABNORMAL HIGH (ref 100–199)
HDL: 45 mg/dL (ref >39)
LDL Chol Calc (NIH): 174 mg/dL — ABNORMAL HIGH (ref 0–99)
Triglycerides: 150 mg/dL — ABNORMAL HIGH (ref 0–149)
VLDL Cholesterol Cal: 28 mg/dL (ref 5–40)

## 2022-08-03 MED ORDER — ATORVASTATIN CALCIUM 20 MG PO TABS: 20.0000 mg | ORAL_TABLET | Freq: Every day | ORAL | 2 refills | Status: DC

## 2022-08-04 ENCOUNTER — Ambulatory Visit
Admission: RE | Admit: 2022-08-04 | Discharge: 2022-08-04 | Disposition: A | Discharge status: Home / Self Care | Payer: BC Managed Care – PPO | Source: Ambulatory Visit | Attending: Nurse Practitioner | Admitting: Nurse Practitioner

## 2022-08-04 DIAGNOSIS — Z1231 Encounter for screening mammogram for malignant neoplasm of breast: Secondary | ICD-10-CM | POA: Diagnosis not present

## 2022-08-11 ENCOUNTER — Other Ambulatory Visit: Payer: BC Managed Care – PPO

## 2022-08-11 ENCOUNTER — Other Ambulatory Visit: Payer: Self-pay | Admitting: Nurse Practitioner

## 2022-08-11 DIAGNOSIS — N183 Chronic kidney disease, stage 3 unspecified: Secondary | ICD-10-CM

## 2022-08-13 LAB — RENAL FUNCTION PANEL
Albumin: 4.8 g/dL (ref 3.8–4.9)
BUN/Creatinine Ratio: 15 (ref 9–23)
BUN: 23 mg/dL (ref 6–24)
CO2: 24 mmol/L (ref 20–29)
Calcium: 9.9 mg/dL (ref 8.7–10.2)
Chloride: 100 mmol/L (ref 96–106)
Creatinine, Ser: 1.56 mg/dL — ABNORMAL HIGH (ref 0.57–1.00)
Glucose: 125 mg/dL — ABNORMAL HIGH (ref 70–99)
Phosphorus: 3.4 mg/dL (ref 3.0–4.3)
Potassium: 5.1 mmol/L (ref 3.5–5.2)
Sodium: 138 mmol/L (ref 134–144)
eGFR: 38 mL/min/{1.73_m2} — ABNORMAL LOW (ref >59)

## 2022-08-13 LAB — PROTEIN / CREATININE RATIO, URINE
Creatinine, Urine: 72.6 mg/dL
Protein, Ur: 20.3 mg/dL
Protein/Creat Ratio: 280 mg/g creat — ABNORMAL HIGH (ref 0–200)

## 2022-08-13 LAB — PTH, INTACT AND CALCIUM: PTH: 72 pg/mL — ABNORMAL HIGH (ref 15–65)

## 2022-08-13 LAB — MAGNESIUM: Magnesium: 2.2 mg/dL (ref 1.6–2.3)

## 2022-08-13 LAB — HEMOGLOBIN: Hemoglobin: 10.1 g/dL — ABNORMAL LOW (ref 11.1–15.9)

## 2022-08-13 LAB — VITAMIN D 25 HYDROXY (VIT D DEFICIENCY, FRACTURES): Vit D, 25-Hydroxy: 23.1 ng/mL — ABNORMAL LOW (ref 30.0–100.0)

## 2022-08-17 DIAGNOSIS — R801 Persistent proteinuria, unspecified: Secondary | ICD-10-CM | POA: Diagnosis not present

## 2022-08-17 DIAGNOSIS — D509 Iron deficiency anemia, unspecified: Secondary | ICD-10-CM | POA: Diagnosis not present

## 2022-08-17 DIAGNOSIS — N183 Chronic kidney disease, stage 3 unspecified: Secondary | ICD-10-CM | POA: Diagnosis not present

## 2022-09-08 ENCOUNTER — Telehealth: Payer: Self-pay | Admitting: Neurology

## 2022-09-08 NOTE — Telephone Encounter (Signed)
LVM returning pt call. 

## 2022-09-08 NOTE — Telephone Encounter (Signed)
Called patient to follow up from the below. Patient reports today she was at work and her right hand felt cold, she started touching objects with her left hand and she could feel the objects. When she started touching objects with right hand she reports she noticed her right hand was numb. Patient reports this lasted about 10 minutes, no other symptoms besides right hand complaint. I spoke with Dr. Leta Baptist and relayed the complaint. After Dr.Penumalli reviewed patient chart, he said if patient is not concerned and not having any other symptoms she can wait to assess with Dr.Sater. I called patient and informed her of this as well and she felt comfortable with this plan. Patient said she just wanted to relay information to Dr.Sater per his request.

## 2022-09-08 NOTE — Telephone Encounter (Signed)
Pt called stating that she is starting to experience numbness on her R hand and arm. Pt would like to discuss with RN.

## 2022-09-09 NOTE — Progress Notes (Signed)
Name: Aimee Austin  MRN/ DOB: [id number], 1961/07/31    Age/ Sex: 62 y.o., female    PCP: Minette Brine, FNP   Reason for Endocrinology Evaluation: Hyperthyroidism      Date of Initial Endocrinology Evaluation: 08/26/2021    HPI: Aimee Austin is a 62 y.o. female with a past medical history of HTN and dyslipidemia. The patient presented for initial endocrinology clinic visit on 08/26/2021 for consultative assistance with her Hyperthyroidism.   Patient has been diagnosed with hyperthyroidism on 08/24/2021 when she was noted to have a suppressed TSH at 0.005 you IU/mL and elevated free T3 at 5 pg/mL with normal total T4 at 10.7 UG/DL.   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     Weight has been gradually increasing  Has an occasional local neck swelling  Denies loose stool or diarrhea Has rare  palpitations She continues with hand tremors  No Biotin   Methimazole 5 mg, 1 tab Monday through Saturday (skip Sundays)     HISTORY:  Past Medical History:  Past Medical History:  Diagnosis Date   Anemia    Arthritis    Rheumatoid Arthritis 2010   Asthma    Eczema    Essential hypertension, benign 08/02/2022   Renal disorder    Strep throat    Thyroid disease    Past Surgical History:  Past Surgical History:  Procedure Laterality Date   ABDOMINAL HYSTERECTOMY  03/1998   Uterus only    Social History:  reports that she has never smoked. She has never used smokeless  tobacco. She reports that she does not drink alcohol and does not use drugs. Family History: family history includes Cancer in her mother. She was adopted.   HOME MEDICATIONS: Allergies as of 09/12/2022       Reactions   Shellfish Allergy Anaphylaxis   Sulfa Antibiotics Anaphylaxis        Medication List        Accurate as of September 12, 2022  9:05 AM. If you have any questions, ask your nurse or doctor.          STOP taking these medications    ferrous sulfate 325 (65 FE) MG tablet Stopped by: Dorita Sciara, MD       TAKE these medications    albuterol 108 (90 Base) MCG/ACT inhaler Commonly known as: VENTOLIN HFA Inhale 2 puffs into the lungs every 6 (six) hours as needed for wheezing or shortness of breath.   atorvastatin 20 MG tablet Commonly known as: Lipitor Take 1 tablet (20 mg total) by mouth daily.   Cholecalciferol 50 MCG (2000 UT) Caps Take 1 capsule by mouth.   fluocinonide-emollient 0.05 % cream Commonly known as: LIDEX-E Apply 1 Application topically 3 (three) times daily.   hydrOXYzine 25 MG tablet Commonly known as: ATARAX Take 1 tablet (25 mg total) by mouth as needed.   IRON PO Take by mouth  daily.   lisinopril 20 MG tablet Commonly known as: ZESTRIL Take 1 tablet (20 mg total) by mouth daily.   methimazole 5 MG tablet Commonly known as: TAPAZOLE Take 1 tab by mouth in am and 1/2 tablet by mouth pm What changed:  how much to take how to take this when to take this additional instructions   VITAMIN D BOOSTER PO Take 1 tablet by mouth daily.          REVIEW OF SYSTEMS: A comprehensive ROS was conducted with the patient and is negative except as per HPI    OBJECTIVE:  VS: BP 136/84 (BP Location: Left Arm, Patient Position: Sitting, Cuff Size: Small)   Pulse 68   Ht '5\' 5"'$  (1.651 m)   Wt 148 lb (67.1 kg)   SpO2 94%   BMI 24.63 kg/m   Wt Readings from Last 3 Encounters:  09/12/22 148 lb (67.1 kg)  08/02/22 146  lb (66.2 kg)  06/23/22 144 lb 8 oz (65.5 kg)     EXAM: General: Pt appears well and is in NAD  Neck: General: Supple without adenopathy. Thyroid: Left thyroid nodule appreciated, no nodules on the right appreciated on today's exam   Lungs: Clear with good BS bilat with no rales, rhonchi, or wheezes  Heart: Auscultation: RRR.  Abdomen: Normoactive bowel sounds, soft, nontender, without masses or organomegaly palpable  Extremities:  BL LE: No pretibial edema normal ROM and strength.  Mental Status: Judgment, insight: Intact Orientation: Oriented to time, place, and person Mood and affect: No depression, anxiety, or agitation     DATA REVIEWED:  ***   Latest Reference Range & Units Most Recent  TRAB <=2.00 IU/L 3.38 (H) 12/01/21 15:51        Thyroid ultrasound 09/01/2021 Estimated total number of nodules >/= 1 cm: 3   Number of spongiform nodules >/=  2 cm not described below (TR1): 0   Number of mixed cystic and solid nodules >/= 1.5 cm not described below ([place]): 0   _________________________________________________________   Interval decreased size of previously biopsied (Bethesda category 2) right mid solid thyroid nodule (labeled 1, 1.2 cm, previously 1.8 cm). No interval development of concerning sonographic features.   Nodule # 2:   Location: Right; Mid   Maximum size: 0.7 cm; Other 2 dimensions: 0.7 x 0.5 cm   Composition: mixed cystic and solid (1)   Echogenicity: isoechoic (1)   Shape: not taller-than-wide (0)   Margins: smooth (0)   Echogenic foci: none (0)   ACR TI-RADS total points: 2.   ACR TI-RADS risk category: TR2 (2 points).   ACR TI-RADS recommendations:   This nodule does NOT meet TI-RADS criteria for biopsy or dedicated follow-up.   _________________________________________________________   Nodule # 3:   Prior biopsy: No   Location: Right; Mid   Maximum size: 1.1 cm; Other 2 dimensions: 0.7 x 0.7 cm, previously, 0.9 x  0.8 x 0.3 cm   Composition: mixed cystic and solid (1)   Echogenicity: isoechoic (1)   Shape: not taller-than-wide (0)   Margins: ill-defined (0)   Echogenic foci: none (0)   ACR TI-RADS total points: 2.   ACR TI-RADS risk category:  TR2 (2 points).   Significant change in size (>/= 20% in two dimensions and minimal increase of 2 mm): Yes   Change in features: No   Change in ACR TI-RADS risk category: No   ACR TI-RADS recommendations:   This nodule does NOT meet TI-RADS criteria for  biopsy or dedicated follow-up.   _________________________________________________________   Nodule # 4:   Location: Right; Mid   Maximum size: 0.9 cm; Other 2 dimensions: 0.7 x 0.4 cm   Composition: solid/almost completely solid (2)   Echogenicity: isoechoic (1)   Shape: not taller-than-wide (0)   Margins: ill-defined (0)   Echogenic foci: none (0)   ACR TI-RADS total points: 3.   ACR TI-RADS risk category: TR3 (3 points).   ACR TI-RADS recommendations:   Given size (<1.4 cm) and appearance, this nodule does NOT meet TI-RADS criteria for biopsy or dedicated follow-up.   _________________________________________________________   Interval increased size of previously biopsied (Bethesda category 2) left inferior pole solid thyroid nodule (labeled 5, 3.4 cm, previously 2.5 cm). A nodule remains heterogeneously I so to hyperechoic with scattered spongiform features.   No cervical lymphadenopathy.   IMPRESSION: 1. Multinodular goiter with interval development of a few additional right-sided thyroid nodules which appear benign and do not warrant additional ultrasound follow-up or tissue sampling. 2. Interval enlargement of previously biopsied left inferior pole solid thyroid nodule (labeled 5) to 3.4 cm from 2.5 cm with similar appearing sonographic features. Recommend correlation with prior biopsy results. 3. Interval decreased size of previously biopsied right mid  solid thyroid nodule (labeled 1) to 1.2 cm from 1.8 cm with similar appearing sonographic features. Recommend correlation with prior biopsy results.     FNA right mid nodule 07/15/2016  Benign cytology    FNA left inferior nodule 07/15/2016  Benign cytology     FNA left inferior nodule 12/20/2021   - Consistent with benign follicular nodule (Bethesda category II)  Some cells have Hurthle cell features but present mostly as flat,  honeycomb sheets, in the presence of colloid.  These findings are most  characteristic for a benign follicular nodule rather than a Hurthle cell  neoplasm.   ASSESSMENT/PLAN/RECOMMENDATIONS:   Hyperthyroidism:  -Patient with Graves' disease due to elevated TRAb, cannot exclude toxic thyroid nodules as well - She is becoming conscious about her MNG. Discussed treatment options to include radiofrequency ablation, RAI ablation vs total thyroidectomy  -Clinically it appears this is more of Graves' disease as she is needing less methimazole over time - TFT's ****    Medications :  methimazole 5 mg , 1 tablet Monday through Saturday (skip Sundays)   2. MNG:  - No local neck symptoms  - She is S/P benign FNA of the right and left thyroid nodules in 2017, we repeated FNA of the right inferior nodule due to enlarge and the size in 12/2021 with benign cytology -We will repeat thyroid ultrasound      F/U in 6 months    Signed electronically by: Mack Guise, MD  [facility]  [facility] [address] [place], [place] [address] Phone: [phone] FAX: [phone]   CC: Minette Brine, [address] [place] [address] Phone: [phone] Fax: [phone]   Return to Endocrinology clinic as below: Future Appointments  Date Time Provider [place]  09/12/2022  9:30 AM Donabelle Molden, Melanie Crazier, MD [facility] None  11/23/2022  8:40 AM Minette Brine, FNP  [facility] None

## 2022-09-10 ENCOUNTER — Encounter: Payer: Self-pay | Admitting: Neurology

## 2022-09-12 ENCOUNTER — Ambulatory Visit (INDEPENDENT_AMBULATORY_CARE_PROVIDER_SITE_OTHER): Payer: BC Managed Care – PPO | Admitting: Internal Medicine

## 2022-09-12 ENCOUNTER — Encounter: Payer: Self-pay | Admitting: Internal Medicine

## 2022-09-12 VITALS — BP 136/84 | HR 68 | Ht 65.0 in | Wt 148.0 lb

## 2022-09-12 DIAGNOSIS — E0590 Thyrotoxicosis, unspecified without thyrotoxic crisis or storm: Secondary | ICD-10-CM

## 2022-09-12 DIAGNOSIS — E042 Nontoxic multinodular goiter: Secondary | ICD-10-CM

## 2022-09-12 LAB — T4, FREE: Free T4: 0.79 ng/dL (ref 0.60–1.60)

## 2022-09-12 LAB — TSH: TSH: 2.89 u[IU]/mL (ref 0.35–5.50)

## 2022-09-13 MED ORDER — METHIMAZOLE 5 MG PO TABS: ORAL_TABLET | ORAL | 3 refills | Status: DC

## 2022-09-13 NOTE — Telephone Encounter (Signed)
Chmg-error.  

## 2022-09-27 ENCOUNTER — Ambulatory Visit (INDEPENDENT_AMBULATORY_CARE_PROVIDER_SITE_OTHER): Payer: BC Managed Care – PPO | Admitting: Nurse Practitioner

## 2022-09-27 ENCOUNTER — Encounter: Payer: Self-pay | Admitting: Nurse Practitioner

## 2022-09-27 VITALS — BP 122/74 | HR 91 | Temp 98.3°F | Ht 65.0 in | Wt 147.8 lb

## 2022-09-27 DIAGNOSIS — R222 Localized swelling, mass and lump, trunk: Secondary | ICD-10-CM | POA: Diagnosis not present

## 2022-09-27 DIAGNOSIS — I129 Hypertensive chronic kidney disease with stage 1 through stage 4 chronic kidney disease, or unspecified chronic kidney disease: Secondary | ICD-10-CM

## 2022-09-27 DIAGNOSIS — M549 Dorsalgia, unspecified: Secondary | ICD-10-CM | POA: Diagnosis not present

## 2022-09-27 DIAGNOSIS — N183 Chronic kidney disease, stage 3 unspecified: Secondary | ICD-10-CM

## 2022-09-27 MED ORDER — CYCLOBENZAPRINE HCL 5 MG PO TABS: 5.0000 mg | ORAL_TABLET | Freq: Three times a day (TID) | ORAL | 0 refills | Status: AC | PRN

## 2022-09-27 NOTE — Patient Instructions (Signed)
Chronic Back Pain When back pain lasts longer than 3 months, it is called chronic back pain. Pain may get worse at certain times (flare-ups). There are things you can do at home to manage your pain. Follow these instructions at home: Pay attention to any changes in your symptoms. Take these actions to help with your pain: Managing pain and stiffness     If told, put ice on the painful area. Your doctor may tell you to use ice for 24-48 hours after the flare-up starts. To do this: Put ice in a plastic bag. Place a towel between your skin and the bag. Leave the ice on for 20 minutes, 2-3 times a day. If told, put heat on the painful area. Do this as often as told by your doctor. Use the heat source that your doctor recommends, such as a moist heat pack or a heating pad. Place a towel between your skin and the heat source. Leave the heat on for 20-30 minutes. Take off the heat if your skin turns bright red. This is especially important if you are unable to feel pain, heat, or cold. You may have a greater risk of getting burned. Soak in a warm bath. This can help relieve pain. Activity  Avoid bending and other activities that make pain worse. When standing: Keep your upper back and neck straight. Keep your shoulders pulled back. Avoid slouching. When sitting: Keep your back straight. Relax your shoulders. Do not round your shoulders or pull them backward. Do not sit or stand in one place for long periods of time. Take short rest breaks during the day. Lying down or standing is usually better than sitting. Resting can help relieve pain. When sitting or lying down for a long time, do some mild activity or stretching. This will help to prevent stiffness and pain. Get regular exercise. Ask your doctor what activities are safe for you. Do not lift anything that is heavier than 10 lb (4.5 kg) or the limit that you are told, until your doctor says that it is safe. To prevent injury when you lift  things: Bend your knees. Keep the weight close to your body. Avoid twisting. Sleep on a firm mattress. Try lying on your side with your knees slightly bent. If you lie on your back, put a pillow under your knees. Medicines Treatment may include medicines for pain and swelling taken by mouth or put on the skin, prescription pain medicine, or muscle relaxants. Take over-the-counter and prescription medicines only as told by your doctor. Ask your doctor if the medicine prescribed to you: Requires you to avoid driving or using machinery. Can cause trouble pooping (constipation). You may need to take these actions to prevent or treat trouble pooping: Drink enough fluid to keep your pee (urine) pale yellow. Take over-the-counter or prescription medicines. Eat foods that are high in fiber. These include beans, whole grains, and fresh fruits and vegetables. Limit foods that are high in fat and sugars. These include fried or sweet foods. General instructions Do not use any products that contain nicotine or tobacco, such as cigarettes, e-cigarettes, and chewing tobacco. If you need help quitting, ask your doctor. Keep all follow-up visits as told by your doctor. This is important. Contact a doctor if: Your pain does not get better with rest or medicine. Your pain gets worse, or you have new pain. You have a high fever. You lose weight very quickly. You have trouble doing your normal activities. Get help right away  if: One or both of your legs or feet feel weak. One or both of your legs or feet lose feeling (have numbness). You have trouble controlling when you poop (have a bowel movement) or pee (urinate). You have bad back pain and: You feel like you may vomit (nauseous), or you vomit. You have pain in your belly (abdomen). You have shortness of breath. You faint. Summary When back pain lasts longer than 3 months, it is called chronic back pain. Pain may get worse at certain times  (flare-ups). Use ice and heat as told by your doctor. Your doctor may tell you to use ice after flare-ups. This information is not intended to replace advice given to you by your health care provider. Make sure you discuss any questions you have with your health care provider. Document Revised: 10/02/2019 Document Reviewed: 10/02/2019 Elsevier Patient Education  [place].

## 2022-09-27 NOTE — Progress Notes (Signed)
I,Victoria T Hamilton,acting as a Education administrator for Minette Brine, FNP.,have documented all relevant documentation on the behalf of Minette Brine, FNP,as directed by  Minette Brine, FNP while in the presence of Minette Brine, [place].    Subjective:     Patient ID: Aimee Austin , female    DOB: [birth year] , 62 y.o.   MRN: [id number]   Chief Complaint  Patient presents with   Back Pain    HPI  Pt presents today for upper back pain on the the right side feels like a "ball", pain is worse when she is trying to get up and when taking a deep breath. Pain has been ongoing for 3 weeks. She has been taking ibuprofen but stopped one week ago due to concerns about kidney function and the pain was not improving.   She has not taken any muscle relaxers. She is not established with an orthopedic. She is also inquiring about a referral to a Rheumatologist as she was diagnosed with Rheumatoid arthritis approximately 2001 - she has not been to a specialist since 2010. She is having pain to her right hand with numbness and being followed by Neurology.   She takes her methimazole in the morning and her lisinopril in the morning. She reports the lisinopril makes her sleepy.      Past Medical History:  Diagnosis Date   Anemia    Arthritis    Rheumatoid Arthritis 2010   Asthma    Eczema    Essential hypertension, benign 08/02/2022   Renal disorder    Strep throat    Thyroid disease      Family History  Adopted: Yes  Problem Relation Age of Onset   Cancer Mother      Current Outpatient Medications:    albuterol (VENTOLIN HFA) 108 (90 Base) MCG/ACT inhaler, Inhale 2 puffs into the lungs every 6 (six) hours as needed for wheezing or shortness of breath., Disp: 8 g, Rfl: 5   Cholecalciferol 50 MCG (2000 UT) CAPS, Take 1 capsule by mouth., Disp: , Rfl:    cyclobenzaprine (FLEXERIL) 5 MG tablet, Take 1 tablet (5 mg total) by mouth 3 (three) times daily as needed for muscle spasms., Disp: 30 tablet, Rfl: 0    Ferrous Sulfate (IRON PO), Take by mouth daily., Disp: , Rfl:    fluocinonide-emollient (LIDEX-E) 0.05 % cream, Apply 1 Application topically 3 (three) times daily., Disp: 60 g, Rfl: 1   hydrOXYzine (ATARAX) 25 MG tablet, Take 1 tablet (25 mg total) by mouth as needed., Disp: 30 tablet, Rfl: 3   lisinopril (ZESTRIL) 20 MG tablet, Take 1 tablet (20 mg total) by mouth daily., Disp: 90 tablet, Rfl: 1   methimazole (TAPAZOLE) 5 MG tablet, 1 tablet daily Monday through Friday , none on Saturday and Sundays, Disp: 60 tablet, Rfl: 3   atorvastatin (LIPITOR) 20 MG tablet, Take 1 tablet (20 mg total) by mouth daily. (Patient not taking: Reported on 09/12/2022), Disp: 30 tablet, Rfl: 2   Nutritional Supplements (VITAMIN D BOOSTER PO), Take 1 tablet by mouth daily. (Patient not taking: Reported on 09/27/2022), Disp: , Rfl:    Allergies  Allergen Reactions   Shellfish Allergy Anaphylaxis   Sulfa Antibiotics Anaphylaxis     Review of Systems  Constitutional: Negative.   Respiratory: Negative.    Cardiovascular: Negative.   Musculoskeletal:  Positive for back pain (upper pain) and myalgias.  Neurological: Negative.   Psychiatric/Behavioral: Negative.       Today's Vitals   09/27/22  4656 09/27/22 0919  BP: (!) 148/88 122/74  Pulse: 91   Temp: 98.3 F (36.8 C)   SpO2: 98%   Weight: 147 lb 12.8 oz (67 kg)   Height: '5\' 5"'$  (1.651 m)    Body mass index is 24.6 kg/m.  Wt Readings from Last 3 Encounters:  09/27/22 147 lb 12.8 oz (67 kg)  09/12/22 148 lb (67.1 kg)  08/02/22 146 lb (66.2 kg)    Objective:  Physical Exam Vitals reviewed.  Constitutional:      General: She is not in acute distress.    Appearance: Normal appearance.  Cardiovascular:     Rate and Rhythm: Normal rate and regular rhythm.     Pulses: Normal pulses.     Heart sounds: Normal heart sounds. No murmur heard. Pulmonary:     Effort: Pulmonary effort is normal. No respiratory distress.     Breath sounds: Normal breath  sounds. No wheezing.  Musculoskeletal:        General: No swelling or tenderness. Normal range of motion.  Skin:    General: Skin is warm and dry.     Capillary Refill: Capillary refill takes less than 2 seconds.     Comments: Has palpable lump to right upper back below shoulder blade.   Neurological:     General: No focal deficit present.     Mental Status: She is alert and oriented to person, place, and time.     Cranial Nerves: No cranial nerve deficit.     Motor: No weakness.  Psychiatric:        Mood and Affect: Mood normal. Mood is not anxious.        Behavior: Behavior normal.        Thought Content: Thought content normal.        Judgment: Judgment normal.         Assessment And Plan:     1. Upper back pain Comments: Muscle relaxer given as needed. She has pain when changing position. Advised to avoid taking ibuprofen, instead to take Tylenol. - [place] CHEST SOFT TISSUE; Future - cyclobenzaprine (FLEXERIL) 5 MG tablet; Take 1 tablet (5 mg total) by mouth 3 (three) times daily as needed for muscle spasms.  Dispense: 30 tablet; Refill: 0  2. Lump of skin of back Comments: Has a tender lump to right upper back, will order an ultrasound.  3. Benign hypertension with CKD (chronic kidney disease) stage III (HCC) Blood pressure elevated, repeated and is normal range.     Patient was given opportunity to ask questions. Patient verbalized understanding of the plan and was able to repeat key elements of the plan. All questions were answered to their satisfaction.  Minette Brine, FNP   I, Minette Brine, FNP, have reviewed all documentation for this visit. The documentation on 09/27/22 for the exam, diagnosis, procedures, and orders are all accurate and complete.   IF YOU HAVE BEEN REFERRED TO A SPECIALIST, IT MAY TAKE 1-2 WEEKS TO SCHEDULE/PROCESS THE REFERRAL. IF YOU HAVE NOT HEARD FROM US/SPECIALIST IN TWO WEEKS, PLEASE GIVE [place] A CALL AT [phone].   THE PATIENT IS  ENCOURAGED TO PRACTICE SOCIAL DISTANCING DUE TO THE COVID-19 PANDEMIC.

## 2022-09-28 ENCOUNTER — Ambulatory Visit
Admission: RE | Admit: 2022-09-28 | Discharge: 2022-09-28 | Disposition: A | Discharge status: Home / Self Care | Payer: BC Managed Care – PPO | Source: Ambulatory Visit | Attending: Internal Medicine | Admitting: Internal Medicine

## 2022-09-28 DIAGNOSIS — E0590 Thyrotoxicosis, unspecified without thyrotoxic crisis or storm: Secondary | ICD-10-CM

## 2022-09-29 ENCOUNTER — Ambulatory Visit
Admission: RE | Admit: 2022-09-29 | Discharge: 2022-09-29 | Disposition: A | Discharge status: Home / Self Care | Payer: BC Managed Care – PPO | Source: Ambulatory Visit | Attending: Nurse Practitioner | Admitting: Nurse Practitioner

## 2022-09-29 DIAGNOSIS — M549 Dorsalgia, unspecified: Secondary | ICD-10-CM

## 2022-09-29 DIAGNOSIS — R222 Localized swelling, mass and lump, trunk: Secondary | ICD-10-CM | POA: Diagnosis not present

## 2022-10-05 ENCOUNTER — Other Ambulatory Visit: Payer: Self-pay | Admitting: Nurse Practitioner

## 2022-10-05 DIAGNOSIS — Z8739 Personal history of other diseases of the musculoskeletal system and connective tissue: Secondary | ICD-10-CM

## 2022-10-10 ENCOUNTER — Other Ambulatory Visit: Payer: Self-pay | Admitting: Nurse Practitioner

## 2022-10-10 ENCOUNTER — Telehealth: Payer: Self-pay

## 2022-10-10 DIAGNOSIS — R222 Localized swelling, mass and lump, trunk: Secondary | ICD-10-CM

## 2022-10-10 DIAGNOSIS — R0789 Other chest pain: Secondary | ICD-10-CM

## 2022-10-10 NOTE — Telephone Encounter (Signed)
Patient called to report the muscle relaxers are not working. She states she still has pain. She would like to know what else can be done.   Please advise, thank you!

## 2022-10-11 ENCOUNTER — Encounter: Payer: Self-pay | Admitting: Nurse Practitioner

## 2022-10-11 ENCOUNTER — Other Ambulatory Visit: Payer: Self-pay | Admitting: Neurology

## 2022-10-11 DIAGNOSIS — R208 Other disturbances of skin sensation: Secondary | ICD-10-CM

## 2022-10-11 DIAGNOSIS — R200 Anesthesia of skin: Secondary | ICD-10-CM

## 2022-10-11 DIAGNOSIS — M5030 Other cervical disc degeneration, unspecified cervical region: Secondary | ICD-10-CM

## 2022-10-11 NOTE — Telephone Encounter (Signed)
Asked Jillian to call and schedule pt

## 2022-10-25 DIAGNOSIS — L815 Leukoderma, not elsewhere classified: Secondary | ICD-10-CM | POA: Diagnosis not present

## 2022-10-25 DIAGNOSIS — L84 Corns and callosities: Secondary | ICD-10-CM | POA: Diagnosis not present

## 2022-10-25 DIAGNOSIS — L2089 Other atopic dermatitis: Secondary | ICD-10-CM | POA: Diagnosis not present

## 2022-10-25 DIAGNOSIS — D2372 Other benign neoplasm of skin of left lower limb, including hip: Secondary | ICD-10-CM | POA: Diagnosis not present

## 2022-10-26 ENCOUNTER — Telehealth: Payer: Self-pay | Admitting: Nurse Practitioner

## 2022-10-26 NOTE — Telephone Encounter (Signed)
Spoke with [employer] for CT scan posterior thorax PA, approved from Feb 21-Mar 21st, 2024. Authorization # [id number]

## 2022-11-07 ENCOUNTER — Other Ambulatory Visit: Payer: BC Managed Care – PPO

## 2022-11-07 DIAGNOSIS — M069 Rheumatoid arthritis, unspecified: Secondary | ICD-10-CM | POA: Diagnosis not present

## 2022-11-07 DIAGNOSIS — E0590 Thyrotoxicosis, unspecified without thyrotoxic crisis or storm: Secondary | ICD-10-CM | POA: Diagnosis not present

## 2022-11-07 DIAGNOSIS — D649 Anemia, unspecified: Secondary | ICD-10-CM | POA: Diagnosis not present

## 2022-11-07 DIAGNOSIS — N1832 Chronic kidney disease, stage 3b: Secondary | ICD-10-CM | POA: Diagnosis not present

## 2022-11-10 ENCOUNTER — Ambulatory Visit
Admission: RE | Admit: 2022-11-10 | Discharge: 2022-11-10 | Disposition: A | Discharge status: Home / Self Care | Payer: BC Managed Care – PPO | Source: Ambulatory Visit | Attending: Nurse Practitioner | Admitting: Nurse Practitioner

## 2022-11-10 ENCOUNTER — Ambulatory Visit (INDEPENDENT_AMBULATORY_CARE_PROVIDER_SITE_OTHER): Payer: BC Managed Care – PPO | Admitting: Neurology

## 2022-11-10 DIAGNOSIS — R911 Solitary pulmonary nodule: Secondary | ICD-10-CM | POA: Diagnosis not present

## 2022-11-10 DIAGNOSIS — R918 Other nonspecific abnormal finding of lung field: Secondary | ICD-10-CM | POA: Diagnosis not present

## 2022-11-10 DIAGNOSIS — R222 Localized swelling, mass and lump, trunk: Secondary | ICD-10-CM

## 2022-11-10 DIAGNOSIS — R208 Other disturbances of skin sensation: Secondary | ICD-10-CM

## 2022-11-10 DIAGNOSIS — R079 Chest pain, unspecified: Secondary | ICD-10-CM | POA: Diagnosis not present

## 2022-11-10 DIAGNOSIS — M5030 Other cervical disc degeneration, unspecified cervical region: Secondary | ICD-10-CM

## 2022-11-10 DIAGNOSIS — R200 Anesthesia of skin: Secondary | ICD-10-CM

## 2022-11-10 DIAGNOSIS — R0789 Other chest pain: Secondary | ICD-10-CM

## 2022-11-10 NOTE — Progress Notes (Unsigned)
Full Name: Aimee Austin Gender: Female MRN #: [id number] Date of Birth: [birth year]    Visit Date: 11/10/2022 08:00 Age: 62 Years Examining Physician: Dr. Sarina Ill Referring Physician: Dr. Arlice Colt Height: 5 feet 5 inch  History: . Her fingers feel cold (placing a heat pack made her numbness better). One day left arm went numb, no particular dermatomal distribution. She has not had the numbness lately now the thumb hurts along the left thumb dorsal tendon  Summary: Nerve Conduction Studies were performed on the bilateral upper extremities.  The right median APB motor nerve showed prolonged distal onset latency (3.7 ms, N<4.4)  The left  median APB motor nerve showed prolonged distal onset latency (3.5 ms, N<4.4)  The right Median 2nd Digit orthodromic sensory nerve showed borderline distal peak latency (3.4 ms, N<3.4)  The left  Median 2nd Digit orthodromic sensory nerve showed prolonged distal peak latency 3.5 ms, N<3.4)  The right median/ulnar (palm) comparison nerve showed prolonged distal peak latency (Median Palm, 3.5 ms, N<2.2) and abnormal peak latency difference (Median Palm-Ulnar Palm, 0.7 ms, N<0.4) with a relative median delay.   The left median/ulnar (palm) comparison nerve showed prolonged distal peak latency (Median Palm, 3.5 ms, N<2.2) and abnormal peak latency difference (Median Palm-Ulnar Palm, 0.7 ms, N<0.4) with a relative median delay.   All remaining nerves (as indicated in the following tables) were within normal limits.   All muscles (as indicated in the following tables) were within normal limits.    Conclusion: There is electrophysiologic evidence of bilateral moderately-severe Carpal Tunnel Syndrome.  No suggestion of polyneuropathy or radiculopathy.    ------------------------------- Sarina Ill, M.D.  [facility] [address], [place], [place] [address] Tel: [phone] Fax: [phone]  Verbal informed consent  was obtained from the patient, patient was informed of potential risk of procedure, including bruising, bleeding, hematoma formation, infection, muscle weakness, muscle pain, numbness, among others.        [place]    Nerve / Sites Muscle Latency Ref. Amplitude Ref. Rel Amp Segments Distance Velocity Ref. Area    ms ms mV mV %  cm m/s m/s mVms  L Median - APB     Wrist APB 3.5 ?4.4 13.9 ?4.0 100 Wrist - APB 7   50.5     Upper arm APB 8.1  13.6  97.8 Upper arm - Wrist 25 54 ?49 49.5  R Median - APB     Wrist APB 3.7 ?4.4 12.1 ?4.0 100 Wrist - APB 7   32.6     Upper arm APB 8.4  10.8  89.6 Upper arm - Wrist 24.5 52 ?49 31.7  L Ulnar - ADM     Wrist ADM 3.1 ?3.3 8.2 ?6.0 100 Wrist - ADM 7   23.5     B.Elbow ADM 5.6  7.1  85.9 B.Elbow - Wrist 13 52 ?49 21.6     A.Elbow ADM 7.8  7.4  105 A.Elbow - B.Elbow 15 67 ?49 23.9  R Ulnar - ADM     Wrist ADM 2.6 ?3.3 9.3 ?6.0 100 Wrist - ADM 7   24.3     B.Elbow ADM 5.1  10.0  109 B.Elbow - Wrist 13 52 ?49 28.3     A.Elbow ADM 7.6  9.0  78.6 A.Elbow - B.Elbow 16 65 ?49 24.4             SNC    Nerve / Sites Rec. Site Peak Lat  Ref.  Amp Ref. Segments Distance Peak Diff Ref.    ms ms V V  cm ms ms  L Radial - Anatomical snuff box (Forearm)     Forearm Wrist 2.7 ?2.9 22 ?15 Forearm - Wrist 10    L Median, Ulnar - Transcarpal comparison     Median Palm Wrist 3.5 ?2.2 19 ?35 Median Palm - Wrist 8       Ulnar Palm Wrist 2.7 ?2.2 7 ?12 Ulnar Palm - Wrist 8          Median Palm - Ulnar Palm  0.7 ?0.4  R Median, Ulnar - Transcarpal comparison     Median Palm Wrist 3.5 ?2.2 10 ?35 Median Palm - Wrist 8       Ulnar Palm Wrist 2.8 ?2.2 3 ?12 Ulnar Palm - Wrist 8          Median Palm - Ulnar Palm  0.7 ?0.4  R Median - Orthodromic (Dig II, Mid palm)     Dig II Wrist 3.4 ?3.4 13 ?10 Dig II - Wrist 13    L Median - Orthodromic, (Dig V, Mid palm)     Dig V Wrist 3.5 ?3.1 21 ?5 Dig V - Wrist 11    L Ulnar - Orthodromic, (Dig V, Mid palm)     Dig V Wrist 2.9  ?3.1 12 ?5 Dig V - Wrist 11    R Ulnar - Orthodromic, (Dig V, Mid palm)     Dig V Wrist 2.8 ?3.1 7 ?5 Dig V - Wrist 108                     F  Wave    Nerve F Lat Ref.   ms ms  L Ulnar - ADM 29.4 ?32.0  R Ulnar - ADM 26.9 ?32.0         EMG Summary Table    Spontaneous MUAP Recruitment  Muscle IA Fib PSW Fasc Other Amp Dur. Poly Pattern  L. Cervical paraspinals (low) Normal None None None _______ Normal Normal Normal Normal  R. Cervical paraspinals (low) Normal None None None _______ Normal Normal Normal Normal  L. Deltoid Normal None None None _______ Normal Normal Normal Normal  R. Deltoid Normal None None None _______ Normal Normal Normal Normal  L. Triceps brachii Normal None None None _______ Normal Normal Normal Normal  R. Triceps brachii Normal None None None _______ Normal Normal Normal Normal  L. Pronator teres Normal None None None _______ Normal Normal Normal Normal  R. Pronator teres Normal None None None _______ Normal Normal Normal Normal  L. Opponens pollicis Normal None None None _______ Normal Normal Normal Normal  R. Opponens pollicis Normal None None None _______ Normal Normal Normal Normal  L. First dorsal interosseous Normal None None None _______ Normal Normal Normal Normal  R. First dorsal interosseous Normal None None None _______ Normal Normal Normal Normal

## 2022-11-10 NOTE — Patient Instructions (Addendum)
Wear wrist splints at night/day 4-6 weeks. Discussed carpal tunnel. Is going to physical therapy, ask about occupational therapy for CTS.     Carpal Tunnel Syndrome  Carpal tunnel syndrome is a condition that causes pain, numbness, and weakness in your hand and fingers. The carpal tunnel is a narrow area located on the palm side of your wrist. Repeated wrist motion or certain diseases may cause swelling within the tunnel. This swelling pinches the main nerve in the wrist. The main nerve in the wrist is called the median nerve. What are the causes? This condition may be caused by: Repeated and forceful wrist and hand motions. Wrist injuries. Arthritis. A cyst or tumor in the carpal tunnel. Fluid buildup during pregnancy. Use of tools that vibrate. Sometimes the cause of this condition is not known. What increases the risk? The following factors may make you more likely to develop this condition: Having a job that requires you to repeatedly or forcefully move your wrist or hand or requires you to use tools that vibrate. This may include jobs that involve using computers, working on an [employer], or working with [place] such as Pension scheme manager. Being a woman. Having certain conditions, such as: Diabetes. Obesity. An underactive thyroid (hypothyroidism). Kidney failure. Rheumatoid arthritis. What are the signs or symptoms? Symptoms of this condition include: A tingling feeling in your fingers, especially in your thumb, index, and middle fingers. Tingling or numbness in your hand. An aching feeling in your entire arm, especially when your wrist and elbow are bent for a long time. Wrist pain that goes up your arm to your shoulder. Pain that goes down into your palm or fingers. A weak feeling in your hands. You may have trouble grabbing and holding items. Your symptoms may feel worse during the night. How is this diagnosed? This condition is diagnosed with a medical history  and physical exam. You may also have tests, including: Electromyogram (EMG). This test measures electrical signals sent by your nerves into the muscles. Nerve conduction study. This test measures how well electrical signals pass through your nerves. Imaging tests, such as X-rays, ultrasound, and MRI. These tests check for possible causes of your condition. How is this treated? This condition may be treated with: Lifestyle changes. It is important to stop or change the activity that caused your condition. Doing exercise and activities to strengthen and stretch your muscles and tendons (physical therapy). Making lifestyle changes to help with your condition and learning how to do your daily activities safely (occupational therapy). Medicines for pain and inflammation. This may include medicine that is injected into your wrist. A wrist splint or brace. Surgery. Follow these instructions at home: If you have a splint or brace: Wear the splint or brace as told by your health care provider. Remove it only as told by your health care provider. Loosen the splint or brace if your fingers tingle, become numb, or turn cold and blue. Keep the splint or brace clean. If the splint or brace is not waterproof: Do not let it get wet. Cover it with a watertight covering when you take a bath or shower. Managing pain, stiffness, and swelling If directed, put ice on the painful area. To do this: If you have a removeable splint or brace, remove it as told by your health care provider. Put ice in a plastic bag. Place a towel between your skin and the bag or between the splint or brace and the bag. Leave the ice on  for 20 minutes, 2-3 times a day. Do not fall asleep with the cold pack on your skin. Remove the ice if your skin turns bright red. This is very important. If you cannot feel pain, heat, or cold, you have a greater risk of damage to the area. Move your fingers often to reduce stiffness and  swelling. General instructions Take over-the-counter and prescription medicines only as told by your health care provider. Rest your wrist and hand from any activity that may be causing your pain. If your condition is work related, talk with your employer about changes that can be made, such as getting a wrist pad to use while typing. Do any exercises as told by your health care provider, physical therapist, or occupational therapist. Keep all follow-up visits. This is important. Contact a health care provider if: You have new symptoms. Your pain is not controlled with medicines. Your symptoms get worse. Get help right away if: You have severe numbness or tingling in your wrist or hand. Summary Carpal tunnel syndrome is a condition that causes pain, numbness, and weakness in your hand and fingers. It is usually caused by repeated wrist motions. Lifestyle changes and medicines are used to treat carpal tunnel syndrome. Surgery may be recommended. Follow your health care provider's instructions about wearing a splint, resting from activity, keeping follow-up visits, and calling for help. This information is not intended to replace advice given to you by your health care provider. Make sure you discuss any questions you have with your health care provider. Document Revised: 01/02/2020 Document Reviewed: 01/02/2020 Elsevier Patient Education  [place]  Raynaud's phenomenon is a condition that affects the blood vessels (arteries) that carry blood to the fingers and toes. The arteries that supply blood to the ears, lips, nipples, or the tip of the nose might also be affected. Raynaud's phenomenon causes the arteries to become narrow temporarily (spasm). As a result, the flow of blood to the affected areas is temporarily decreased. This usually occurs in response to cold temperatures or stress. During an attack, the skin in the affected areas turns white, then blue,  and finally red. A person may also feel tingling or numbness in those areas. Attacks usually last for only a brief period, and then the blood flow to the area returns to normal. In most cases, Raynaud's phenomenon does not cause serious health problems. What are the causes? In many cases, the cause of this condition is not known. The condition may occur on its own (primary Raynaud's phenomenon) or may be associated with other diseases or factors (secondary Raynaud's phenomenon). Possible causes may include: Diseases or medical conditions that damage the arteries. Injuries and repetitive actions that hurt the hands or feet. Being exposed to certain chemicals. Taking medicines that narrow the arteries. Other medical conditions, such as lupus, scleroderma, rheumatoid arthritis, thyroid problems, blood disorders, Sjogren syndrome, or atherosclerosis. What increases the risk? The following factors may make you more likely to develop this condition: Being 47-2 years old. Being female. Having a family history of Raynaud's phenomenon. Living in a cold climate. Smoking. What are the signs or symptoms? Symptoms of this condition usually occur when you are exposed to cold temperatures or when you have emotional stress. The symptoms may last for a few minutes or up to several hours. They usually affect your fingers but may also affect your toes, nipples, lips, ears, or the tip of your nose. Symptoms may include: Changes in skin color. The  skin in the affected areas will turn pale or white. The skin may then change from white to bluish to red as normal blood flow returns to the area. Numbness, tingling, or pain in the affected areas. In severe cases, symptoms may include: Skin sores. Tissues decaying and dying (gangrene). How is this diagnosed? This condition may be diagnosed based on: Your symptoms and medical history. A physical exam. During the exam, you may be asked to put your hands in cold water  to check for a reaction to cold temperature. Tests, such as: Blood tests to check for other diseases or conditions. A test to check the movement of blood through your arteries and veins (vascular ultrasound). A test in which the skin at the base of your fingernail is examined under a microscope (nailfold capillaroscopy). How is this treated? During an episode, you can take actions to help symptoms go away faster. Options include moving your arms around in a windmill pattern, warming your fingers under warm water, or placing your fingers in a warm body fold, such as your armpit. Long-term treatment for this condition often involves making lifestyle changes and taking steps to control your exposure to cold temperature. For more severe cases, medicine (calcium channel blockers) may be used to improve blood circulation. Follow these instructions at home: Avoiding cold temperatures Take these steps to avoid exposure to cold: If possible, stay indoors during cold weather. When you go outside during cold weather, dress in layers and wear mittens, a hat, a scarf, and warm footwear. Wear mittens or gloves when handling ice or frozen food. Use holders for glasses or cans containing cold drinks. Let warm water run for a while before taking a shower or bath. Warm up the car before driving in cold weather. Lifestyle If possible, avoid stressful and emotional situations. Try to find ways to manage your stress, such as: Exercise. Yoga. Meditation. Biofeedback. Do not use any products that contain nicotine or tobacco. These products include cigarettes, chewing tobacco, and vaping devices, such as e-cigarettes. If you need help quitting, ask your health care provider. Avoid secondhand smoke. Limit your use of caffeine. Switch to decaffeinated coffee, tea, and soda. Avoid chocolate. Avoid vibrating tools and machinery. General instructions Protect your hands and feet from injuries, cuts, or  bruises. Avoid wearing tight rings or wristbands. Wear loose fitting socks and comfortable, roomy shoes. Take over-the-counter and prescription medicines only as told by your health care provider. Where to find support Raynaud's Association: www.raynauds.org Where to find more information [employer] of Arthritis and Musculoskeletal and Skin Diseases: www.niams.SouthExposed.es Contact a health care provider if: Your discomfort becomes worse despite lifestyle changes. You develop sores on your fingers or toes that do not heal. You have breaks in the skin on your fingers or toes. You have a fever. You have pain or swelling in your joints. You have a rash. Your symptoms occur on only one side of your body. Get help right away if: Your fingers or toes turn black. You have severe pain in the affected areas. These symptoms may represent a serious problem that is an emergency. Do not wait to see if the symptoms will go away. Get medical help right away. Call your local emergency services (911 in the [place] Do not drive yourself to the hospital. Summary Raynaud's phenomenon is a condition that affects the arteries that carry blood to the fingers, toes, ears, lips, nipples, or the tip of the nose. In many cases, the cause of this condition is  not known. Symptoms of this condition include changes in skin color along with numbness and tingling in the affected area. Treatment for this condition includes lifestyle changes and reducing exposure to cold temperatures. Medicines may be used for severe cases of the condition. Contact your health care provider if your condition worsens despite treatment. This information is not intended to replace advice given to you by your health care provider. Make sure you discuss any questions you have with your health care provider. Document Revised: 10/27/2020 Document Reviewed: 10/27/2020 Elsevier Patient Education  [place].

## 2022-11-11 DIAGNOSIS — Z79899 Other long term (current) drug therapy: Secondary | ICD-10-CM | POA: Diagnosis not present

## 2022-11-11 DIAGNOSIS — E559 Vitamin D deficiency, unspecified: Secondary | ICD-10-CM | POA: Diagnosis not present

## 2022-11-11 DIAGNOSIS — D539 Nutritional anemia, unspecified: Secondary | ICD-10-CM | POA: Diagnosis not present

## 2022-11-15 NOTE — Procedures (Signed)
Full Name: Aimee Austin Gender: Female MRN #: [id number] Date of Birth: [birth year]    Visit Date: 11/10/2022 08:00 Age: 62 Years Examining Physician: Dr. Sarina Ill Referring Physician: Dr. Arlice Colt Height: 5 feet 5 inch  History: . Her fingers feel cold (placing a heat pack made her numbness better). One day left arm went numb, no particular dermatomal distribution. She has not had the numbness lately now the thumb hurts along the left thumb dorsal tendon  Summary: Nerve Conduction Studies were performed on the bilateral upper extremities.  The right median APB motor nerve showed prolonged distal onset latency (3.7 ms, N<4.4)  The left  median APB motor nerve showed prolonged distal onset latency (3.5 ms, N<4.4)  The right Median 2nd Digit orthodromic sensory nerve showed borderline distal peak latency (3.4 ms, N<3.4)  The left  Median 2nd Digit orthodromic sensory nerve showed prolonged distal peak latency 3.5 ms, N<3.4)  The right median/ulnar (palm) comparison nerve showed prolonged distal peak latency (Median Palm, 3.5 ms, N<2.2) and abnormal peak latency difference (Median Palm-Ulnar Palm, 0.7 ms, N<0.4) with a relative median delay.   The left median/ulnar (palm) comparison nerve showed prolonged distal peak latency (Median Palm, 3.5 ms, N<2.2) and abnormal peak latency difference (Median Palm-Ulnar Palm, 0.7 ms, N<0.4) with a relative median delay.   All remaining nerves (as indicated in the following tables) were within normal limits.   All muscles (as indicated in the following tables) were within normal limits.    Conclusion: There is electrophysiologic evidence of bilateral moderately-severe Carpal Tunnel Syndrome.  No suggestion of polyneuropathy or radiculopathy.    ------------------------------- Sarina Ill, M.D.  [facility] [address], [place], [place] [address] Tel: [phone] Fax: [phone]  Verbal informed consent  was obtained from the patient, patient was informed of potential risk of procedure, including bruising, bleeding, hematoma formation, infection, muscle weakness, muscle pain, numbness, among others.        [place]    Nerve / Sites Muscle Latency Ref. Amplitude Ref. Rel Amp Segments Distance Velocity Ref. Area    ms ms mV mV %  cm m/s m/s mVms  L Median - APB     Wrist APB 3.5 ?4.4 13.9 ?4.0 100 Wrist - APB 7   50.5     Upper arm APB 8.1  13.6  97.8 Upper arm - Wrist 25 54 ?49 49.5  R Median - APB     Wrist APB 3.7 ?4.4 12.1 ?4.0 100 Wrist - APB 7   32.6     Upper arm APB 8.4  10.8  89.6 Upper arm - Wrist 24.5 52 ?49 31.7  L Ulnar - ADM     Wrist ADM 3.1 ?3.3 8.2 ?6.0 100 Wrist - ADM 7   23.5     B.Elbow ADM 5.6  7.1  85.9 B.Elbow - Wrist 13 52 ?49 21.6     A.Elbow ADM 7.8  7.4  105 A.Elbow - B.Elbow 15 67 ?49 23.9  R Ulnar - ADM     Wrist ADM 2.6 ?3.3 9.3 ?6.0 100 Wrist - ADM 7   24.3     B.Elbow ADM 5.1  10.0  109 B.Elbow - Wrist 13 52 ?49 28.3     A.Elbow ADM 7.6  9.0  78.6 A.Elbow - B.Elbow 16 65 ?49 24.4             SNC    Nerve / Sites Rec. Site Peak Lat  Ref.  Amp Ref. Segments Distance Peak Diff Ref.    ms ms V V  cm ms ms  L Radial - Anatomical snuff box (Forearm)     Forearm Wrist 2.7 ?2.9 22 ?15 Forearm - Wrist 10    L Median, Ulnar - Transcarpal comparison     Median Palm Wrist 3.5 ?2.2 19 ?35 Median Palm - Wrist 8       Ulnar Palm Wrist 2.7 ?2.2 7 ?12 Ulnar Palm - Wrist 8          Median Palm - Ulnar Palm  0.7 ?0.4  R Median, Ulnar - Transcarpal comparison     Median Palm Wrist 3.5 ?2.2 10 ?35 Median Palm - Wrist 8       Ulnar Palm Wrist 2.8 ?2.2 3 ?12 Ulnar Palm - Wrist 8          Median Palm - Ulnar Palm  0.7 ?0.4  R Median - Orthodromic (Dig II, Mid palm)     Dig II Wrist 3.4 ?3.4 13 ?10 Dig II - Wrist 13    L Median - Orthodromic, (Dig V, Mid palm)     Dig V Wrist 3.5 ?3.1 21 ?5 Dig V - Wrist 11    L Ulnar - Orthodromic, (Dig V, Mid palm)     Dig V Wrist 2.9  ?3.1 12 ?5 Dig V - Wrist 11    R Ulnar - Orthodromic, (Dig V, Mid palm)     Dig V Wrist 2.8 ?3.1 7 ?5 Dig V - Wrist 65                     F  Wave    Nerve F Lat Ref.   ms ms  L Ulnar - ADM 29.4 ?32.0  R Ulnar - ADM 26.9 ?32.0         EMG Summary Table    Spontaneous MUAP Recruitment  Muscle IA Fib PSW Fasc Other Amp Dur. Poly Pattern  L. Cervical paraspinals (low) Normal None None None _______ Normal Normal Normal Normal  R. Cervical paraspinals (low) Normal None None None _______ Normal Normal Normal Normal  L. Deltoid Normal None None None _______ Normal Normal Normal Normal  R. Deltoid Normal None None None _______ Normal Normal Normal Normal  L. Triceps brachii Normal None None None _______ Normal Normal Normal Normal  R. Triceps brachii Normal None None None _______ Normal Normal Normal Normal  L. Pronator teres Normal None None None _______ Normal Normal Normal Normal  R. Pronator teres Normal None None None _______ Normal Normal Normal Normal  L. Opponens pollicis Normal None None None _______ Normal Normal Normal Normal  R. Opponens pollicis Normal None None None _______ Normal Normal Normal Normal  L. First dorsal interosseous Normal None None None _______ Normal Normal Normal Normal  R. First dorsal interosseous Normal None None None _______ Normal Normal Normal Normal

## 2022-11-16 DIAGNOSIS — N041 Nephrotic syndrome with focal and segmental glomerular lesions: Secondary | ICD-10-CM | POA: Diagnosis not present

## 2022-11-16 DIAGNOSIS — N183 Chronic kidney disease, stage 3 unspecified: Secondary | ICD-10-CM | POA: Diagnosis not present

## 2022-11-16 DIAGNOSIS — R809 Proteinuria, unspecified: Secondary | ICD-10-CM | POA: Diagnosis not present

## 2022-11-23 ENCOUNTER — Encounter: Payer: BC Managed Care – PPO | Admitting: Nurse Practitioner

## 2022-11-29 DIAGNOSIS — M25521 Pain in right elbow: Secondary | ICD-10-CM | POA: Diagnosis not present

## 2022-11-29 DIAGNOSIS — M069 Rheumatoid arthritis, unspecified: Secondary | ICD-10-CM | POA: Diagnosis not present

## 2022-11-29 DIAGNOSIS — M25542 Pain in joints of left hand: Secondary | ICD-10-CM | POA: Diagnosis not present

## 2022-12-01 DIAGNOSIS — J45909 Unspecified asthma, uncomplicated: Secondary | ICD-10-CM | POA: Diagnosis not present

## 2022-12-01 DIAGNOSIS — Z79899 Other long term (current) drug therapy: Secondary | ICD-10-CM | POA: Diagnosis not present

## 2022-12-01 DIAGNOSIS — E0590 Thyrotoxicosis, unspecified without thyrotoxic crisis or storm: Secondary | ICD-10-CM | POA: Diagnosis not present

## 2022-12-01 DIAGNOSIS — M069 Rheumatoid arthritis, unspecified: Secondary | ICD-10-CM | POA: Diagnosis not present

## 2022-12-06 DIAGNOSIS — M25521 Pain in right elbow: Secondary | ICD-10-CM | POA: Diagnosis not present

## 2022-12-06 DIAGNOSIS — M069 Rheumatoid arthritis, unspecified: Secondary | ICD-10-CM | POA: Diagnosis not present

## 2022-12-06 DIAGNOSIS — M25542 Pain in joints of left hand: Secondary | ICD-10-CM | POA: Diagnosis not present

## 2022-12-12 DIAGNOSIS — Z7189 Other specified counseling: Secondary | ICD-10-CM | POA: Diagnosis not present

## 2022-12-15 DIAGNOSIS — M79642 Pain in left hand: Secondary | ICD-10-CM | POA: Diagnosis not present

## 2022-12-15 DIAGNOSIS — R5383 Other fatigue: Secondary | ICD-10-CM | POA: Diagnosis not present

## 2022-12-15 DIAGNOSIS — M0579 Rheumatoid arthritis with rheumatoid factor of multiple sites without organ or systems involvement: Secondary | ICD-10-CM | POA: Diagnosis not present

## 2022-12-15 DIAGNOSIS — M79641 Pain in right hand: Secondary | ICD-10-CM | POA: Diagnosis not present

## 2022-12-16 DIAGNOSIS — M25521 Pain in right elbow: Secondary | ICD-10-CM | POA: Diagnosis not present

## 2022-12-16 DIAGNOSIS — M25542 Pain in joints of left hand: Secondary | ICD-10-CM | POA: Diagnosis not present

## 2022-12-16 DIAGNOSIS — M069 Rheumatoid arthritis, unspecified: Secondary | ICD-10-CM | POA: Diagnosis not present

## 2022-12-20 DIAGNOSIS — J3081 Allergic rhinitis due to animal (cat) (dog) hair and dander: Secondary | ICD-10-CM | POA: Diagnosis not present

## 2022-12-26 DIAGNOSIS — M0579 Rheumatoid arthritis with rheumatoid factor of multiple sites without organ or systems involvement: Secondary | ICD-10-CM | POA: Diagnosis not present

## 2022-12-26 DIAGNOSIS — M79641 Pain in right hand: Secondary | ICD-10-CM | POA: Diagnosis not present

## 2022-12-26 DIAGNOSIS — M79642 Pain in left hand: Secondary | ICD-10-CM | POA: Diagnosis not present

## 2022-12-26 DIAGNOSIS — M1991 Primary osteoarthritis, unspecified site: Secondary | ICD-10-CM | POA: Diagnosis not present

## 2023-01-03 DIAGNOSIS — J45909 Unspecified asthma, uncomplicated: Secondary | ICD-10-CM | POA: Diagnosis not present

## 2023-01-03 DIAGNOSIS — M069 Rheumatoid arthritis, unspecified: Secondary | ICD-10-CM | POA: Diagnosis not present

## 2023-01-06 DIAGNOSIS — R059 Cough, unspecified: Secondary | ICD-10-CM | POA: Diagnosis not present

## 2023-01-06 DIAGNOSIS — J018 Other acute sinusitis: Secondary | ICD-10-CM | POA: Diagnosis not present

## 2023-01-10 DIAGNOSIS — H00023 Hordeolum internum right eye, unspecified eyelid: Secondary | ICD-10-CM | POA: Diagnosis not present

## 2023-02-07 DIAGNOSIS — Z20822 Contact with and (suspected) exposure to covid-19: Secondary | ICD-10-CM | POA: Diagnosis not present

## 2023-02-07 DIAGNOSIS — J45909 Unspecified asthma, uncomplicated: Secondary | ICD-10-CM | POA: Diagnosis not present

## 2023-02-07 DIAGNOSIS — J029 Acute pharyngitis, unspecified: Secondary | ICD-10-CM | POA: Diagnosis not present

## 2023-02-09 DIAGNOSIS — D509 Iron deficiency anemia, unspecified: Secondary | ICD-10-CM | POA: Diagnosis not present

## 2023-02-09 DIAGNOSIS — E042 Nontoxic multinodular goiter: Secondary | ICD-10-CM | POA: Diagnosis not present

## 2023-02-09 DIAGNOSIS — N1831 Chronic kidney disease, stage 3a: Secondary | ICD-10-CM | POA: Diagnosis not present

## 2023-02-09 DIAGNOSIS — N183 Chronic kidney disease, stage 3 unspecified: Secondary | ICD-10-CM | POA: Diagnosis not present

## 2023-02-10 IMAGING — US US FNA BIOPSY THYROID 1ST LESION
1 series · 13 of 13 positions shown · non-contrast
Comparison: 09/01/2021;

INDICATION: Enlarging left-sided thyroid nodule. Left-sided thyroid nodule was
ultrasound-guided FNA for tissue diagnostic purposes.

EXAM:
ULTRASOUND GUIDED FINE NEEDLE ASPIRATION OF INDETERMINATE THYROID
NODULE
TECHNIQUE: Informed written consent was obtained from the patient after a
discussion of the risks, benefits and alternatives to treatment.
Questions regarding the procedure were encouraged and answered. A
timeout was performed prior to the initiation of the procedure.

[Series 1: us fna biopsy thyroid 1st lesion · 0.06mm/px · 13 acquisitions, 13 frames shown]
[im 1/13]
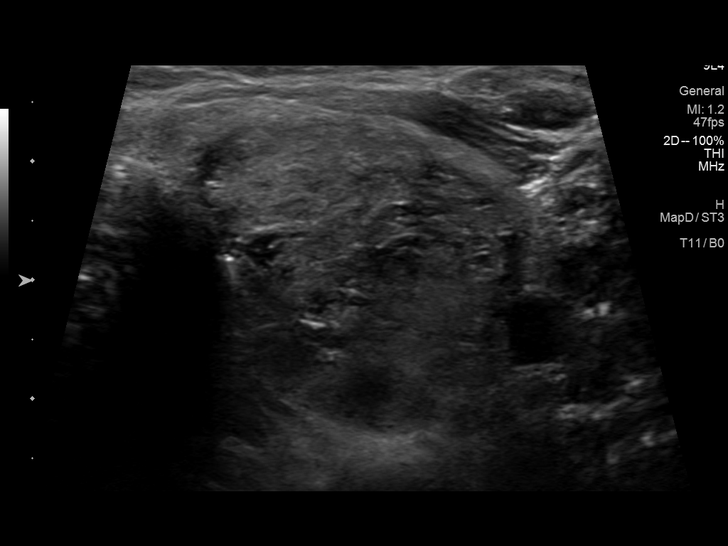
[im 2/13]
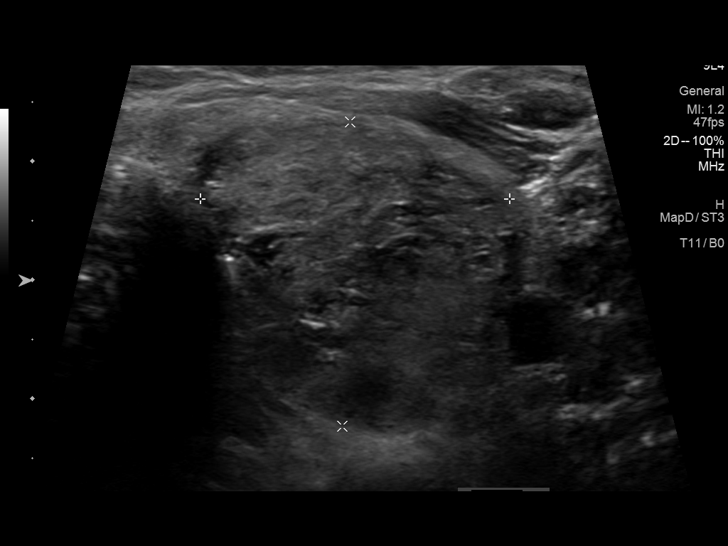
[im 3/13]
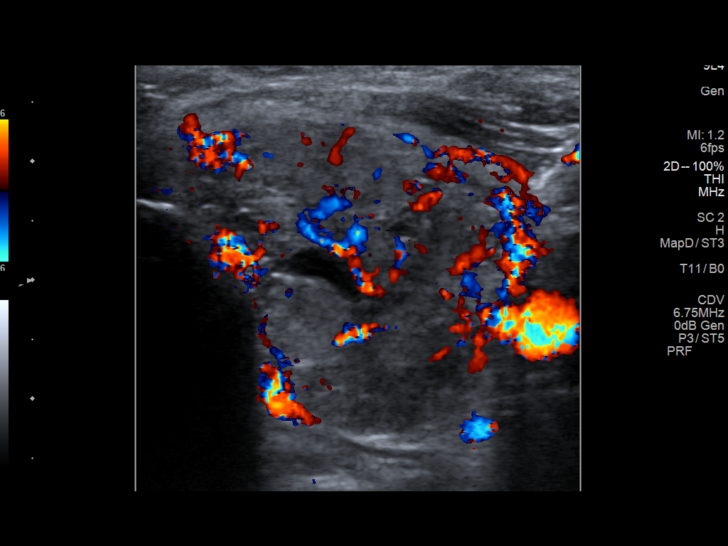
[im 4/13]
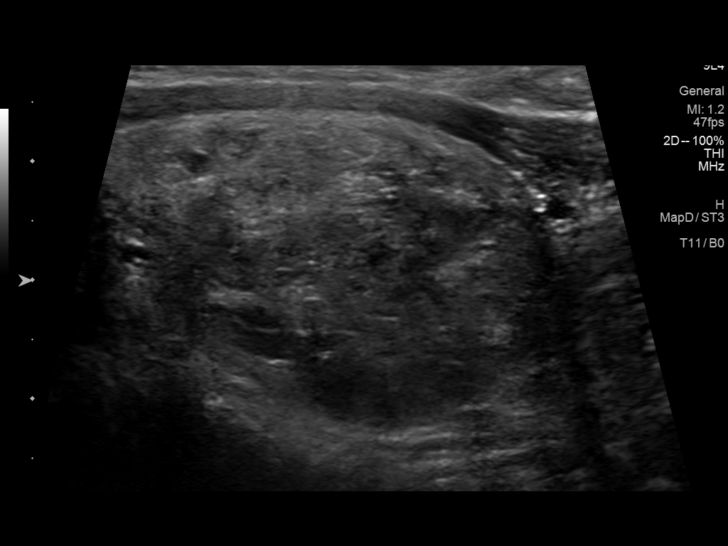
[im 5/13]
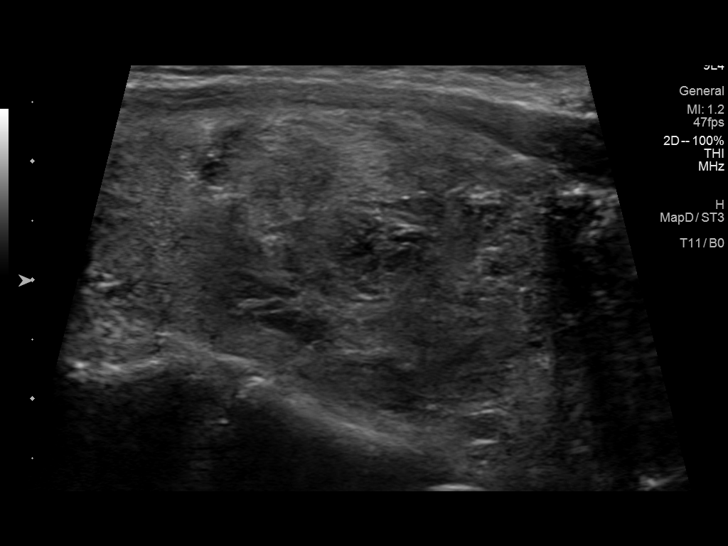
[im 6/13]
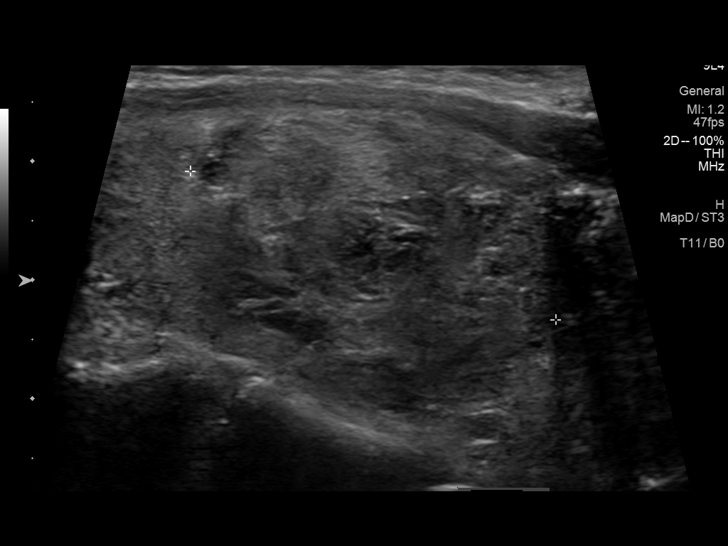
[im 7/13]
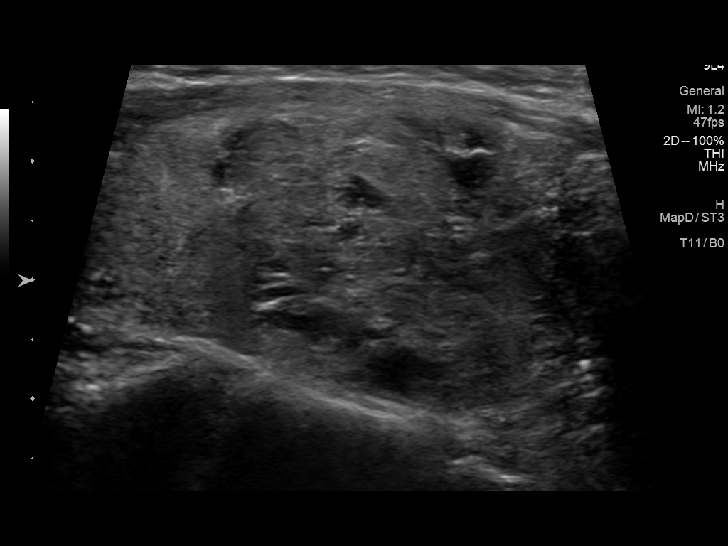
[im 8/13]
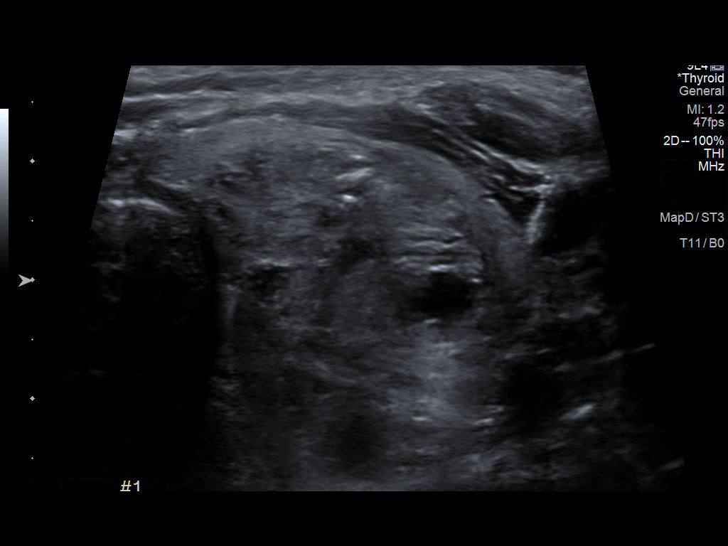
[im 9/13]
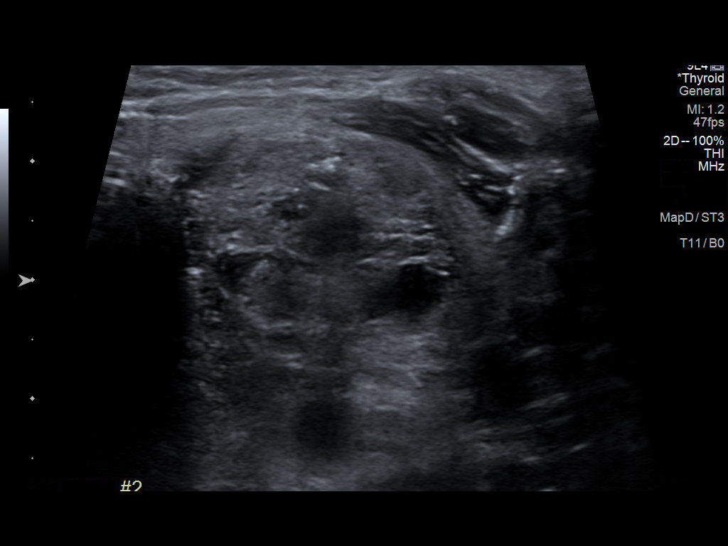
[im 10/13]
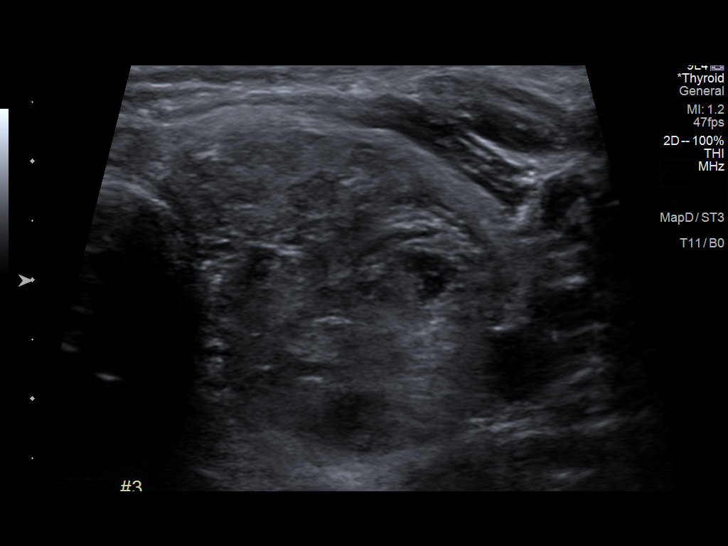
[im 11/13]
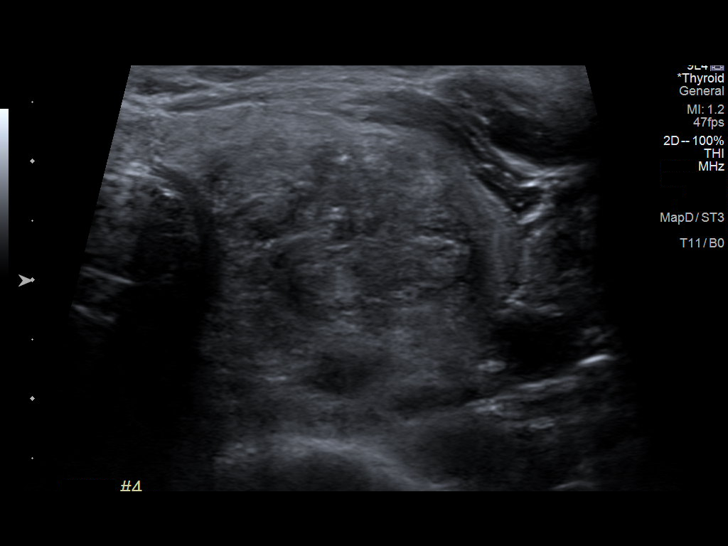
[im 12/13]
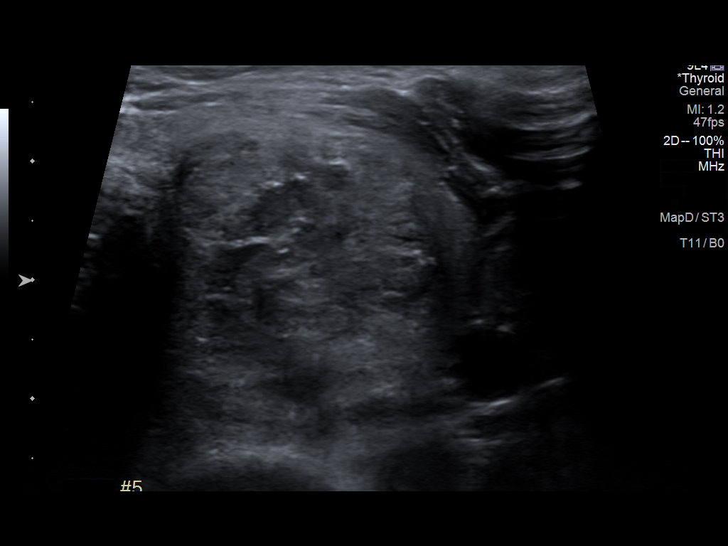
[im 13/13]
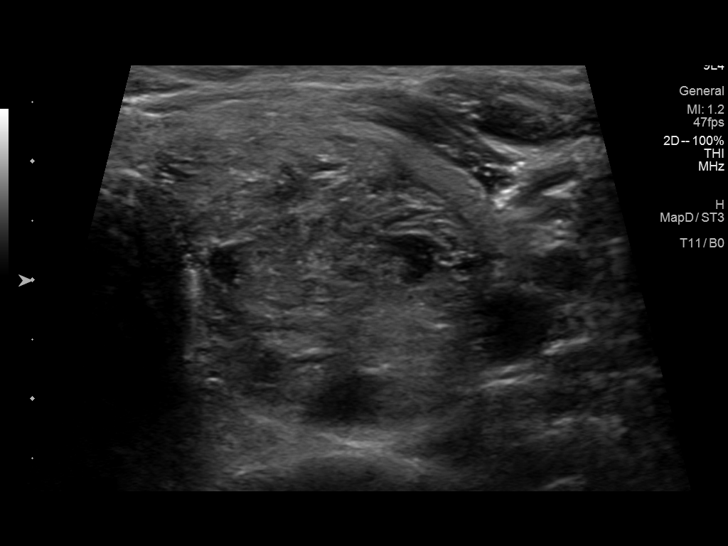

[13 of 13 positions shown; findings below may reference images not displayed]

07/04/2016

Ultrasound-guided bilateral thyroid nodule fine-needle
aspiration-07/15/2016

MEDICATIONS:
None

COMPLICATIONS:
None immediate.
Pre-procedural ultrasound scanning demonstrated unchanged size and
appearance of the indeterminate nodule within the inferior pole the
left lobe of the thyroid

The procedure was planned. The neck was prepped in the usual sterile
fashion, and a sterile drape was applied covering the operative
field. A timeout was performed prior to the initiation of the
procedure. Local anesthesia was provided with 1% lidocaine.

Under direct ultrasound guidance, 5 FNA biopsies were performed of
the indeterminate nodule within the inferior pole the left lobe of
the thyroid with a 25 gauge needle. Multiple ultrasound images were
saved for procedural documentation purposes. The samples were
prepared and submitted to pathology.

Limited post procedural scanning was negative for hematoma or
additional complication. Dressings were placed. The patient
tolerated the above procedures procedure well without immediate
postprocedural complication.
FINDINGS: Nodule reference number based on prior diagnostic ultrasound: 5

Maximum size: 3.4 cm

Location: Left; Inferior

ACR TI-RADS risk category: TR2 (2 points)

Reason for biopsy: patient/referrer request

Ultrasound imaging confirms appropriate placement of the needles
within the thyroid nodule.
IMPRESSION: Technically successful ultrasound guided fine needle aspiration of
enlarging now 3.4 cm TR 2 nodule (labeled 5) within the inferior
pole of the left lobe of the thyroid.

## 2023-02-14 DIAGNOSIS — N183 Chronic kidney disease, stage 3 unspecified: Secondary | ICD-10-CM | POA: Diagnosis not present

## 2023-02-14 DIAGNOSIS — N269 Renal sclerosis, unspecified: Secondary | ICD-10-CM | POA: Diagnosis not present

## 2023-02-14 DIAGNOSIS — R809 Proteinuria, unspecified: Secondary | ICD-10-CM | POA: Diagnosis not present

## 2023-02-20 DIAGNOSIS — E559 Vitamin D deficiency, unspecified: Secondary | ICD-10-CM | POA: Diagnosis not present

## 2023-02-20 DIAGNOSIS — Z0000 Encounter for general adult medical examination without abnormal findings: Secondary | ICD-10-CM | POA: Diagnosis not present

## 2023-02-20 DIAGNOSIS — R0602 Shortness of breath: Secondary | ICD-10-CM | POA: Diagnosis not present

## 2023-02-20 DIAGNOSIS — R5383 Other fatigue: Secondary | ICD-10-CM | POA: Diagnosis not present

## 2023-02-20 DIAGNOSIS — Z780 Asymptomatic menopausal state: Secondary | ICD-10-CM | POA: Diagnosis not present

## 2023-02-20 DIAGNOSIS — Z131 Encounter for screening for diabetes mellitus: Secondary | ICD-10-CM | POA: Diagnosis not present

## 2023-02-20 DIAGNOSIS — Z1339 Encounter for screening examination for other mental health and behavioral disorders: Secondary | ICD-10-CM | POA: Diagnosis not present

## 2023-02-20 DIAGNOSIS — Z1159 Encounter for screening for other viral diseases: Secondary | ICD-10-CM | POA: Diagnosis not present

## 2023-03-16 ENCOUNTER — Encounter: Payer: Self-pay | Admitting: Internal Medicine

## 2023-03-16 ENCOUNTER — Ambulatory Visit (INDEPENDENT_AMBULATORY_CARE_PROVIDER_SITE_OTHER): Payer: BC Managed Care – PPO | Admitting: Internal Medicine

## 2023-03-16 VITALS — BP 104/68 | HR 74 | Ht 65.0 in | Wt 151.0 lb

## 2023-03-16 DIAGNOSIS — E042 Nontoxic multinodular goiter: Secondary | ICD-10-CM

## 2023-03-16 DIAGNOSIS — E0590 Thyrotoxicosis, unspecified without thyrotoxic crisis or storm: Secondary | ICD-10-CM | POA: Diagnosis not present

## 2023-03-16 DIAGNOSIS — R748 Abnormal levels of other serum enzymes: Secondary | ICD-10-CM | POA: Diagnosis not present

## 2023-03-16 LAB — WHITE CELL DIFFERENTIAL
Band Neutrophils: 0 %
Basophils Relative: 0.9 % (ref 0.0–3.0)
Eosinophils Relative: 3.1 % (ref 0.0–5.0)
Lymphocytes Relative: 34 % (ref 12.0–46.0)
Monocytes Relative: 4.2 % (ref 3.0–12.0)
Neutrophils Relative %: 57.8 % (ref 43.0–77.0)

## 2023-03-16 LAB — T4, FREE: Free T4: 0.74 ng/dL (ref 0.60–1.60)

## 2023-03-16 LAB — HEPATIC FUNCTION PANEL
ALT: 11 U/L (ref 0–35)
AST: 19 U/L (ref 0–37)
Albumin: 4.6 g/dL (ref 3.5–5.2)
Alkaline Phosphatase: 78 U/L (ref 39–117)
Bilirubin, Direct: 0 mg/dL (ref 0.0–0.3)
Total Bilirubin: 0.3 mg/dL (ref 0.2–1.2)
Total Protein: 7.5 g/dL (ref 6.0–8.3)

## 2023-03-16 LAB — TSH: TSH: 2.71 u[IU]/mL (ref 0.35–5.50)

## 2023-03-16 MED ORDER — METHIMAZOLE 5 MG PO TABS: ORAL_TABLET | ORAL | 3 refills | Status: DC

## 2023-03-16 NOTE — Progress Notes (Signed)
Name: Aimee Austin  MRN/ DOB: [id number], December 20, 1960    Age/ Sex: 62 y.o., female    PCP: Felix Pacini, FNP   Reason for Endocrinology Evaluation: Hyperthyroidism      Date of Initial Endocrinology Evaluation: 08/26/2021    HPI: [name] is a 62 y.o. female with a past medical history of HTN and dyslipidemia. The patient presented for initial endocrinology clinic visit on 08/26/2021 for consultative assistance with her Hyperthyroidism.   Patient has been diagnosed with hyperthyroidism on 08/24/2021 when she was noted to have a suppressed TSH at 0.005 you IU/mL and elevated free T3 at 5 pg/mL with normal total T4 at 10.7 UG/DL.   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   Weight continues to fluctuate Continues with occasional local neck swelling  She is also c/o fatigue, she is sleeping at work at times Hair loss and brittle hair  She is on Iron through nephrology  Has ongoing cough with hoarseness - saw PCP treated with Abx  Had rash on left shoulder - resolved  Saw ophthalmology or blurry vision with new prescription  Has constipation and bloating  She has worsening SOB She did PT for carpal tunnel syndrome  No Biotin   Methimazole 5 mg, 1 tab Monday through Friday      HISTORY:  Past Medical History:  Past Medical History:  Diagnosis Date   Anemia    Arthritis    Rheumatoid Arthritis 2010   Asthma    Eczema    Essential hypertension, benign 08/02/2022    Renal disorder    Strep throat    Thyroid disease    Past Surgical History:  Past Surgical History:  Procedure Laterality Date   ABDOMINAL HYSTERECTOMY  03/1998   Uterus only    Social History:  reports that she has never smoked. She has never used smokeless tobacco. She reports that she does not drink alcohol and does not use drugs. Family History: family history includes Cancer in her mother. She was adopted.   HOME MEDICATIONS: Allergies as of 03/16/2023       Reactions   Shellfish Allergy Anaphylaxis   Sulfa Antibiotics Anaphylaxis        Medication List        Accurate as of March 16, 2023 12:13 PM. If you have any questions, ask your nurse or doctor.          STOP taking these medications    atorvastatin 20 MG tablet Commonly known as: Lipitor Stopped by: Johnney Ou Gerlene Glassburn       TAKE these medications    albuterol 108 (90 Base) MCG/ACT inhaler Commonly known as: VENTOLIN HFA Inhale 2 puffs into the lungs every 6 (six) hours as needed for wheezing or shortness of breath.   Cholecalciferol 50 MCG (2000 UT) Caps Take 1 capsule by mouth.   cyclobenzaprine 5 MG tablet Commonly known as: FLEXERIL Take 1 tablet (  5 mg total) by mouth 3 (three) times daily as needed for muscle spasms.   fluocinonide-emollient 0.05 % cream Commonly known as: LIDEX-E Apply 1 Application topically 3 (three) times daily.   hydrOXYzine 25 MG tablet Commonly known as: ATARAX Take 1 tablet (25 mg total) by mouth as needed.   IRON PO Take by mouth daily.   lisinopril 20 MG tablet Commonly known as: ZESTRIL Take 1 tablet (20 mg total) by mouth daily.   methimazole 5 MG tablet Commonly known as: TAPAZOLE 1 tablet daily Monday through Friday , none on Saturday and Sundays   VITAMIN D BOOSTER PO Take 1 tablet by mouth daily.          REVIEW OF SYSTEMS: A comprehensive ROS was conducted with the patient and is negative except as per HPI    OBJECTIVE:  VS: BP  104/68 (BP Location: Left Arm, Patient Position: Sitting, Cuff Size: Small)   Pulse 74   Ht 5\' 5"  (1.651 m)   Wt 151 lb (68.5 kg)   SpO2 97%   BMI 25.13 kg/m   Wt Readings from Last 3 Encounters:  03/16/23 151 lb (68.5 kg)  11/10/22 145 lb 8 oz (66 kg)  09/27/22 147 lb 12.8 oz (67 kg)     EXAM: General: Pt appears well and is in NAD  Neck: General: Supple without adenopathy. Thyroid: Left thyroid nodule appreciated, no nodules on the right appreciated on today's exam   Lungs: Clear with good BS bilat with no rales, rhonchi, or wheezes  Heart: Auscultation: RRR.  Abdomen: Normoactive bowel sounds, soft, nontender, without masses or organomegaly palpable  Extremities:  BL LE: No pretibial edema normal ROM and strength.  Mental Status: Judgment, insight: Intact Orientation: Oriented to time, place, and person Mood and affect: No depression, anxiety, or agitation     DATA REVIEWED:   Latest Reference Range & Units 03/16/23 08:46  Alkaline Phosphatase 39 - 117 U/L 78  Albumin 3.5 - 5.2 g/dL 4.6  AST 0 - 37 U/L 19  ALT 0 - 35 U/L 11  Total Protein 6.0 - 8.3 g/dL 7.5  Bilirubin, Direct 0.0 - 0.3 mg/dL 0.0  Total Bilirubin 0.2 - 1.2 mg/dL 0.3  Neutrophils 16.1 - 77.0 % 57.8  Lymphocytes 12.0 - 46.0 % 34.0  Monocytes Relative 3.0 - 12.0 % 4.2  Eosinophil 0.0 - 5.0 % 3.1  Basophil 0.0 - 3.0 % 0.9  Band Neutrophils % 0.0  TSH 0.35 - 5.50 uIU/mL 2.71  T4,Free(Direct) 0.60 - 1.60 ng/dL 0.96      Latest Reference Range & Units Most Recent  TRAB <=2.00 IU/L 3.38 (H) 12/01/21 15:51        Thyroid ultrasound  09/28/2022  Narrative & Impression  CLINICAL DATA:  Follow up on MNG. S/P benign FNA of the left inferior nodule 12/2021   EXAM: THYROID ULTRASOUND   TECHNIQUE: Ultrasound examination of the thyroid gland and adjacent soft tissues was performed.   COMPARISON:  12/20/2021   09/01/2021   09/03/2016   FINDINGS: Parenchymal Echotexture: Mildly  heterogenous   Isthmus: 0.3 cm   Right lobe: 4.5 x 2.1 x 2.4 cm   Left lobe: 5.6 x 2.8 x 2.7 cm   _________________________________________________________   Estimated total number of nodules >/= 1 cm: 4   Number of spongiform nodules >/=  2 cm not described below (TR1): 0   Number of mixed cystic and solid nodules >/= 1.5 cm not described below (TR2): 0   _________________________________________________________  Nodule 1: 1.7 x 0.9 x 0.8 cm right mid thyroid nodule is not significantly changed in size since 07/04/2016 where it measured 1.7 x 1.3 x 1.1 cm which is consistent with a benign etiology.   Nodule 2: 0.7 x 0.6 x 0.4 cm spongiform right mid thyroid nodule does not meet criteria for imaging surveillance or FNA.   Nodule 3: 1.4 x 1.2 x 0.7 cm solid isoechoic right mid thyroid nodule (TI-RADS 3) is not significantly changed in size since prior examination and does not meet criteria for FNA or imaging follow-up.   Nodule 4: 1.2 x 1.0 x 0.7 cm solid isoechoic right mid thyroid nodule is not significantly changed from prior examination and does not meet criteria for FNA or imaging follow-up.   Nodule 5: 3.2 x 2.8 x 2.6 cm solid isoechoic left thyroid nodule is not significantly changed in size since prior examination. Please correlate with prior FNA results from 12/20/2021.   IMPRESSION: 1. Previously biopsied left thyroid nodule is unchanged in size. Please correlate with prior FNA results from 12/20/2021. 2. Remaining right thyroid nodules do not meet criteria for FNA or imaging follow-up.        FNA right mid nodule 07/15/2016  Benign cytology    FNA left inferior nodule 07/15/2016  Benign cytology     FNA left inferior nodule 12/20/2021   - Consistent with benign follicular nodule (Bethesda category II)  Some cells have Hurthle cell features but present mostly as flat,  honeycomb sheets, in the presence of colloid.  These findings are most   characteristic for a benign follicular nodule rather than a Hurthle cell  neoplasm.   ASSESSMENT/PLAN/RECOMMENDATIONS:   Hyperthyroidism:   -Patient with multiple nonspecific symptoms that cannot be attributed to her thyroid at this time as her levels are within normal range -Patient with Graves' disease due to elevated TRAb, cannot exclude toxic thyroid nodules as well -We discussed alternative therapy  to include radiofrequency ablation, RAI ablation vs total thyroidectomy in the past -Clinically it appears this is more of Graves' disease as she is needing less methimazole over time - TFT's remain normal, will decrease methimazole as below -White cell count and LFTs are normal   Medications :  methimazole 5 mg , 1 tablet Monday through Thursday   (skip Friday, Saturdays and Sundays)   2. MNG:  - No local neck symptoms  - She is S/P benign FNA of the right and left thyroid nodules in 2017, we repeated FNA of the right inferior nodule due to enlarge and the size in 12/2021 with benign cytology -Thyroid ultrasound 09/2022 has been stable    F/U in 6 months   I spent 24 minutes preparing to see the patient by review of recent labs, imaging and procedures, obtaining and reviewing separately obtained history, communicating with the patient, ordering medications, tests or procedures, and documenting clinical information in the EHR including the differential Dx, treatment, and any further evaluation and other management    Signed electronically by: Lyndle Herrlich, MD  [facility] End[facility]  [facility] [address] [place], [place] [address] Phone: [phone] FAX: [phone]   CC: Felix Pacini, FNP [address] [place] [address] Phone: [phone] Fax: [phone]   Return to Endocrinology clinic as below: Future Appointments  Date Time Provider Department Center  09/18/2023  7:50 AM Janice Seales, Konrad Dolores, MD  [facility] None

## 2023-03-17 ENCOUNTER — Encounter: Payer: Self-pay | Admitting: Internal Medicine

## 2023-03-26 ENCOUNTER — Emergency Department (HOSPITAL_BASED_OUTPATIENT_CLINIC_OR_DEPARTMENT_OTHER): Payer: BC Managed Care – PPO

## 2023-03-26 ENCOUNTER — Emergency Department (HOSPITAL_BASED_OUTPATIENT_CLINIC_OR_DEPARTMENT_OTHER)
Admission: EM | Admit: 2023-03-26 | Discharge: 2023-03-26 | Disposition: A | Discharge status: Home / Self Care | Payer: BC Managed Care – PPO | Source: Home / Self Care | Attending: Emergency Medicine | Admitting: Emergency Medicine

## 2023-03-26 ENCOUNTER — Encounter (HOSPITAL_BASED_OUTPATIENT_CLINIC_OR_DEPARTMENT_OTHER): Payer: Self-pay

## 2023-03-26 DIAGNOSIS — N281 Cyst of kidney, acquired: Secondary | ICD-10-CM | POA: Diagnosis not present

## 2023-03-26 DIAGNOSIS — R1012 Left upper quadrant pain: Secondary | ICD-10-CM | POA: Diagnosis not present

## 2023-03-26 DIAGNOSIS — R109 Unspecified abdominal pain: Secondary | ICD-10-CM | POA: Diagnosis not present

## 2023-03-26 DIAGNOSIS — K5900 Constipation, unspecified: Secondary | ICD-10-CM | POA: Diagnosis not present

## 2023-03-26 DIAGNOSIS — K5641 Fecal impaction: Secondary | ICD-10-CM | POA: Diagnosis not present

## 2023-03-26 DIAGNOSIS — R1032 Left lower quadrant pain: Secondary | ICD-10-CM | POA: Diagnosis not present

## 2023-03-26 LAB — CBC WITH DIFFERENTIAL/PLATELET
Abs Immature Granulocytes: 0.02 10*3/uL (ref 0.00–0.07)
Basophils Absolute: 0 10*3/uL (ref 0.0–0.1)
Basophils Relative: 1 %
Eosinophils Absolute: 0.2 10*3/uL (ref 0.0–0.5)
Eosinophils Relative: 3 %
HCT: 30.9 % — ABNORMAL LOW (ref 36.0–46.0)
Hemoglobin: 10.7 g/dL — ABNORMAL LOW (ref 12.0–15.0)
Immature Granulocytes: 0 %
Lymphocytes Relative: 33 %
Lymphs Abs: 2.2 10*3/uL (ref 0.7–4.0)
MCH: 26 pg (ref 26.0–34.0)
MCHC: 34.6 g/dL (ref 30.0–36.0)
MCV: 75.2 fL — ABNORMAL LOW (ref 80.0–100.0)
Monocytes Absolute: 0.3 10*3/uL (ref 0.1–1.0)
Monocytes Relative: 5 %
Neutro Abs: 4 10*3/uL (ref 1.7–7.7)
Neutrophils Relative %: 58 %
Platelets: 146 10*3/uL — ABNORMAL LOW (ref 150–400)
RBC: 4.11 MIL/uL (ref 3.87–5.11)
RDW: 13.9 % (ref 11.5–15.5)
WBC: 6.8 10*3/uL (ref 4.0–10.5)
nRBC: 0 % (ref 0.0–0.2)

## 2023-03-26 LAB — COMPREHENSIVE METABOLIC PANEL
ALT: 9 U/L (ref 0–44)
AST: 16 U/L (ref 15–41)
Albumin: 4.5 g/dL (ref 3.5–5.0)
Alkaline Phosphatase: 69 U/L (ref 38–126)
Anion gap: 7 (ref 5–15)
BUN: 18 mg/dL (ref 6–20)
CO2: 26 mmol/L (ref 22–32)
Calcium: 9.6 mg/dL (ref 8.9–10.3)
Chloride: 108 mmol/L (ref 98–111)
Creatinine, Ser: 1.43 mg/dL — ABNORMAL HIGH (ref 0.44–1.00)
GFR, Estimated: 42 mL/min — ABNORMAL LOW (ref >60)
Glucose, Bld: 96 mg/dL (ref 70–99)
Potassium: 4.1 mmol/L (ref 3.5–5.1)
Sodium: 141 mmol/L (ref 135–145)
Total Bilirubin: 0.2 mg/dL — ABNORMAL LOW (ref 0.3–1.2)
Total Protein: 7.4 g/dL (ref 6.5–8.1)

## 2023-03-26 LAB — URINALYSIS, ROUTINE W REFLEX MICROSCOPIC
Bacteria, UA: NONE SEEN
Bilirubin Urine: NEGATIVE
Glucose, UA: NEGATIVE mg/dL
Ketones, ur: NEGATIVE mg/dL
Leukocytes,Ua: NEGATIVE
Nitrite: NEGATIVE
Specific Gravity, Urine: 1.03 (ref 1.005–1.030)
pH: 6 (ref 5.0–8.0)

## 2023-03-26 LAB — LIPASE, BLOOD: Lipase: 67 U/L — ABNORMAL HIGH (ref 11–51)

## 2023-03-26 MED ORDER — IOHEXOL 300 MG/ML  SOLN
100.0000 mL | Freq: Once | INTRAMUSCULAR | Status: AC | PRN
  Administered 2023-03-26: 75 mL via INTRAVENOUS

## 2023-03-26 MED ORDER — LACTATED RINGERS IV SOLN: INTRAVENOUS | Status: DC

## 2023-03-26 MED ORDER — MINERAL OIL RE ENEM
1.0000 | ENEMA | Freq: Once | RECTAL | Status: AC
  Administered 2023-03-26: 1 via RECTAL
  Filled 2023-03-26: qty 1

## 2023-03-26 NOTE — ED Provider Notes (Addendum)
[place] EMERGENCY DEPARTMENT AT [facility] Provider Note   CSN: [id number] Arrival date & time: 03/26/23  1044     History  Chief Complaint  Patient presents with   Constipation   Abdominal Pain    Aimee Austin is a 62 y.o. female.  62 year old female presents with several days of constipation along with abdominal distention and nausea.  No fever or chills.  No emesis.  Has been passing soft stools.  No prior history of obstruction.  Went to urgent care and sent here for evaluation.  No treatment use prior to arrival other than laxatives.  Denies any chronic opiate use.  Does have a prior history of hysterectomy       Home Medications Prior to Admission medications   Medication Sig Start Date End Date Taking? Authorizing Provider  albuterol (VENTOLIN HFA) 108 (90 Base) MCG/ACT inhaler Inhale 2 puffs into the lungs every 6 (six) hours as needed for wheezing or shortness of breath. 08/02/22   Arnette Felts, FNP  Cholecalciferol 50 MCG (2000 UT) CAPS Take 1 capsule by mouth. 08/17/22 08/17/23  [provider]  cyclobenzaprine (FLEXERIL) 5 MG tablet Take 1 tablet (5 mg total) by mouth 3 (three) times daily as needed for muscle spasms. 09/27/22   Arnette Felts, FNP  Ferrous Sulfate (IRON PO) Take by mouth daily.    [provider]  fluocinonide-emollient (LIDEX-E) 0.05 % cream Apply 1 Application topically 3 (three) times daily. 08/02/22   Arnette Felts, FNP  hydrOXYzine (ATARAX) 25 MG tablet Take 1 tablet (25 mg total) by mouth as needed. 08/02/22   Arnette Felts, FNP  lisinopril (ZESTRIL) 20 MG tablet Take 1 tablet (20 mg total) by mouth daily. 08/02/22   Arnette Felts, FNP  methimazole (TAPAZOLE) 5 MG tablet 1 tablet daily Monday through Thursday , none on Friday  Saturday and Sundays 03/16/23   Shamleffer, Konrad Dolores, MD  Nutritional Supplements (VITAMIN D BOOSTER PO) Take 1 tablet by mouth daily.    [provider]      Allergies     Shellfish allergy and Sulfa antibiotics    Review of Systems   Review of Systems  All other systems reviewed and are negative.   Physical Exam Updated Vital Signs BP (!) 149/82 (BP Location: Right Arm)   Pulse 80   Temp 97.9 F (36.6 C) (Oral)   Resp 16   Ht 1.651 m (5\' 5" )   Wt 67.6 kg   SpO2 99%   BMI 24.79 kg/m  Physical Exam Vitals and nursing note reviewed.  Constitutional:      General: She is not in acute distress.    Appearance: Normal appearance. She is well-developed. She is not toxic-appearing.  HENT:     Head: Normocephalic and atraumatic.  Eyes:     General: Lids are normal.     Conjunctiva/sclera: Conjunctivae normal.     Pupils: Pupils are equal, round, and reactive to light.  Neck:     Thyroid: No thyroid mass.     Trachea: No tracheal deviation.  Cardiovascular:     Rate and Rhythm: Normal rate and regular rhythm.     Heart sounds: Normal heart sounds. No murmur heard.    No gallop.  Pulmonary:     Effort: Pulmonary effort is normal. No respiratory distress.     Breath sounds: Normal breath sounds. No stridor. No decreased breath sounds, wheezing, rhonchi or rales.  Abdominal:     General: There is no distension.  Palpations: Abdomen is soft.     Tenderness: There is generalized abdominal tenderness. There is no rebound.  Musculoskeletal:        General: No tenderness. Normal range of motion.     Cervical back: Normal range of motion and neck supple.  Skin:    General: Skin is warm and dry.     Findings: No abrasion or rash.  Neurological:     Mental Status: She is alert and oriented to person, place, and time. Mental status is at baseline.     GCS: GCS eye subscore is 4. GCS verbal subscore is 5. GCS motor subscore is 6.     Cranial Nerves: Cranial nerves are intact. No cranial nerve deficit.     Sensory: No sensory deficit.     Motor: Motor function is intact.  Psychiatric:        Attention and Perception: Attention normal.         Speech: Speech normal.        Behavior: Behavior normal.     ED Results / Procedures / Treatments   Labs (all labs ordered are listed, but only abnormal results are displayed) Labs Reviewed - No data to display  EKG None  Radiology No results found.  Procedures Procedures    Medications Ordered in ED Medications  lactated ringers infusion (has no administration in time range)    ED Course/ Medical Decision Making/ A&P                             Medical Decision Making Amount and/or Complexity of Data Reviewed Labs: ordered. Radiology: ordered.  Risk OTC drugs. Prescription drug management.  Labs are reassuring here including a CBC and c-Met. Patient presented with concerns for constipation.  She was tender on abdomen.  Subsequently had abdominal CT which showed no evidence of intra-abdominal process other than constipation.  Upon further questioning, patient states that she has increased her iron intake.  Suspect this is the cause of her constipation.  Patient given enema here.  Will go home to move her bowels.        Final Clinical Impression(s) / ED Diagnoses Final diagnoses:  None    Rx / DC Orders ED Discharge Orders     None         Lorre Nick, MD 03/26/23 1417    Lorre Nick, MD 03/26/23 1418

## 2023-03-26 NOTE — ED Triage Notes (Signed)
Seen in UC today and sent here for evaluation.

## 2023-03-26 NOTE — ED Triage Notes (Signed)
States last bowel movement on Tuesday.  Having mid abdominal pain and right side back pain.  Nausea but no vomiting.

## 2023-03-26 NOTE — ED Notes (Signed)
Pt stated she increased her iron medication and is asking if that can cause the new onset of constipation. Discussed that is possible and more then likely the cause. Advised Pt to speak with her Provider when he comes in the room to re- evaluate Pt.

## 2023-03-26 NOTE — ED Notes (Signed)
Pt called out to report she still has not had a BM.  Pt stated it has been 25 minutes since enema was given.

## 2023-03-26 NOTE — ED Notes (Signed)
Pt aware of the need for a urine... Pt unable to provide the sample currently.Marland KitchenMarland Kitchen

## 2023-03-26 NOTE — ED Notes (Signed)
Report given to the next RN... 

## 2023-03-29 DIAGNOSIS — K5904 Chronic idiopathic constipation: Secondary | ICD-10-CM | POA: Diagnosis not present

## 2023-05-15 ENCOUNTER — Emergency Department (HOSPITAL_BASED_OUTPATIENT_CLINIC_OR_DEPARTMENT_OTHER)
Admission: EM | Admit: 2023-05-15 | Discharge: 2023-05-15 | Disposition: A | Discharge status: Home / Self Care | Payer: BC Managed Care – PPO

## 2023-05-15 ENCOUNTER — Other Ambulatory Visit: Payer: Self-pay

## 2023-05-15 DIAGNOSIS — Z79899 Other long term (current) drug therapy: Secondary | ICD-10-CM | POA: Diagnosis not present

## 2023-05-15 DIAGNOSIS — E039 Hypothyroidism, unspecified: Secondary | ICD-10-CM | POA: Diagnosis not present

## 2023-05-15 DIAGNOSIS — K5900 Constipation, unspecified: Secondary | ICD-10-CM | POA: Diagnosis not present

## 2023-05-15 DIAGNOSIS — I10 Essential (primary) hypertension: Secondary | ICD-10-CM | POA: Insufficient documentation

## 2023-05-15 DIAGNOSIS — R748 Abnormal levels of other serum enzymes: Secondary | ICD-10-CM | POA: Insufficient documentation

## 2023-05-15 DIAGNOSIS — R1084 Generalized abdominal pain: Secondary | ICD-10-CM

## 2023-05-15 DIAGNOSIS — R109 Unspecified abdominal pain: Secondary | ICD-10-CM | POA: Diagnosis not present

## 2023-05-15 LAB — COMPREHENSIVE METABOLIC PANEL
ALT: 8 U/L (ref 0–44)
AST: 17 U/L (ref 15–41)
Albumin: 4.5 g/dL (ref 3.5–5.0)
Alkaline Phosphatase: 72 U/L (ref 38–126)
Anion gap: 9 (ref 5–15)
BUN: 23 mg/dL — ABNORMAL HIGH (ref 6–20)
CO2: 26 mmol/L (ref 22–32)
Calcium: 9.4 mg/dL (ref 8.9–10.3)
Chloride: 106 mmol/L (ref 98–111)
Creatinine, Ser: 1.61 mg/dL — ABNORMAL HIGH (ref 0.44–1.00)
GFR, Estimated: 36 mL/min — ABNORMAL LOW (ref >60)
Glucose, Bld: 105 mg/dL — ABNORMAL HIGH (ref 70–99)
Potassium: 4 mmol/L (ref 3.5–5.1)
Sodium: 141 mmol/L (ref 135–145)
Total Bilirubin: 0.3 mg/dL (ref 0.3–1.2)
Total Protein: 7.4 g/dL (ref 6.5–8.1)

## 2023-05-15 LAB — URINALYSIS, ROUTINE W REFLEX MICROSCOPIC
Bacteria, UA: NONE SEEN
Bilirubin Urine: NEGATIVE
Glucose, UA: NEGATIVE mg/dL
Ketones, ur: NEGATIVE mg/dL
Leukocytes,Ua: NEGATIVE
Nitrite: NEGATIVE
Protein, ur: 30 mg/dL — AB
Specific Gravity, Urine: 1.015 (ref 1.005–1.030)
pH: 6 (ref 5.0–8.0)

## 2023-05-15 LAB — CBC
HCT: 29.8 % — ABNORMAL LOW (ref 36.0–46.0)
Hemoglobin: 10 g/dL — ABNORMAL LOW (ref 12.0–15.0)
MCH: 25.3 pg — ABNORMAL LOW (ref 26.0–34.0)
MCHC: 33.6 g/dL (ref 30.0–36.0)
MCV: 75.4 fL — ABNORMAL LOW (ref 80.0–100.0)
Platelets: 208 10*3/uL (ref 150–400)
RBC: 3.95 MIL/uL (ref 3.87–5.11)
RDW: 13.8 % (ref 11.5–15.5)
WBC: 8.5 10*3/uL (ref 4.0–10.5)
nRBC: 0 % (ref 0.0–0.2)

## 2023-05-15 LAB — LIPASE, BLOOD: Lipase: 58 U/L — ABNORMAL HIGH (ref 11–51)

## 2023-05-15 NOTE — ED Triage Notes (Addendum)
Has GI specialist apt end of month. Bloating, pressure, and back pain between shoulders. +N, -V, -D. +SOB especially with deep breathing. CT in July shows stool burden- has started laxatives, but tries not to take daily. Last BM last Wed.

## 2023-05-15 NOTE — ED Notes (Signed)
Reviewed AVS with patient, patient expressed understanding of directions, denies further questions at this time. 

## 2023-05-15 NOTE — ED Provider Notes (Signed)
[place] AT [facility] Provider Note   CSN: [id number] Arrival date & time: 05/15/23  1737     History  Chief Complaint  Patient presents with   Abdominal Pain    Constipation     Aimee Austin is a 62 y.o. female.  With a history of chronic any disease, hypothyroidism, hypertension, chronic constipation who presents to the ED for evaluation constipation.  She states she has been dealing with this for the past 2 months.  She states she does not want to continue taking laxatives to have bowel movements.  Her last bowel movement was Wednesday of last week after she took a laxative.  She has not had a bowel movement since and has not taken a laxative since.  She reports a full sensation to her upper abdomen which is causing her some pain.  She is still passing gas.  No history of abdominal surgeries.  She is unsure of the reasoning behind her constipation.  She does not take chronic opioids.  She was prescribed Linzess by her GI provider but has not used this yet.  She has a follow-up appointment with GI in 3 weeks.  She denies any fevers or chills.  No urinary symptoms.  No rectal pain.   Abdominal Pain Associated symptoms: constipation        Home Medications Prior to Admission medications   Medication Sig Start Date End Date Taking? Authorizing Provider  albuterol (VENTOLIN HFA) 108 (90 Base) MCG/ACT inhaler Inhale 2 puffs into the lungs every 6 (six) hours as needed for wheezing or shortness of breath. 08/02/22   Arnette Felts, FNP  Cholecalciferol 50 MCG (2000 UT) CAPS Take 1 capsule by mouth. 08/17/22 08/17/23  [provider]  cyclobenzaprine (FLEXERIL) 5 MG tablet Take 1 tablet (5 mg total) by mouth 3 (three) times daily as needed for muscle spasms. 09/27/22   Arnette Felts, FNP  Ferrous Sulfate (IRON PO) Take by mouth daily.    [provider]  fluocinonide-emollient (LIDEX-E) 0.05 % cream Apply 1 Application topically 3 (three)  times daily. 08/02/22   Arnette Felts, FNP  hydrOXYzine (ATARAX) 25 MG tablet Take 1 tablet (25 mg total) by mouth as needed. 08/02/22   Arnette Felts, FNP  lisinopril (ZESTRIL) 20 MG tablet Take 1 tablet (20 mg total) by mouth daily. 08/02/22   Arnette Felts, FNP  methimazole (TAPAZOLE) 5 MG tablet 1 tablet daily Monday through Thursday , none on Friday  Saturday and Sundays 03/16/23   Shamleffer, Konrad Dolores, MD  Nutritional Supplements (VITAMIN D BOOSTER PO) Take 1 tablet by mouth daily.    [provider]      Allergies    Shellfish allergy and Sulfa antibiotics    Review of Systems   Review of Systems  Gastrointestinal:  Positive for abdominal pain and constipation.  All other systems reviewed and are negative.   Physical Exam Updated Vital Signs BP (!) 154/76 (BP Location: Right Arm)   Pulse 91   Temp 98.4 F (36.9 C)   Resp 16   SpO2 100%  Physical Exam Vitals and nursing note reviewed.  Constitutional:      General: She is not in acute distress.    Appearance: Normal appearance. She is normal weight. She is not ill-appearing.  HENT:     Head: Normocephalic and atraumatic.  Pulmonary:     Effort: Pulmonary effort is normal. No respiratory distress.  Abdominal:     General: Abdomen is flat. Bowel  sounds are normal.     Tenderness: There is generalized abdominal tenderness. There is no guarding.  Musculoskeletal:        General: Normal range of motion.     Cervical back: Neck supple.  Skin:    General: Skin is warm and dry.  Neurological:     Mental Status: She is alert and oriented to person, place, and time.  Psychiatric:        Mood and Affect: Mood normal.        Behavior: Behavior normal.     ED Results / Procedures / Treatments   Labs (all labs ordered are listed, but only abnormal results are displayed) Labs Reviewed  LIPASE, BLOOD - Abnormal; Notable for the following components:      Result Value   Lipase 58 (*)    All other components  within normal limits  COMPREHENSIVE METABOLIC PANEL - Abnormal; Notable for the following components:   Glucose, Bld 105 (*)    BUN 23 (*)    Creatinine, Ser 1.61 (*)    GFR, Estimated 36 (*)    All other components within normal limits  CBC - Abnormal; Notable for the following components:   Hemoglobin 10.0 (*)    HCT 29.8 (*)    MCV 75.4 (*)    MCH 25.3 (*)    All other components within normal limits  URINALYSIS, ROUTINE W REFLEX MICROSCOPIC - Abnormal; Notable for the following components:   Hgb urine dipstick SMALL (*)    Protein, ur 30 (*)    All other components within normal limits    EKG EKG Interpretation Date/Time:  Monday May 15 2023 17:49:24 EDT Ventricular Rate:  87 PR Interval:  174 QRS Duration:  86 QT Interval:  336 QTC Calculation: 404 R Axis:   84  Text Interpretation: Normal sinus rhythm Normal ECG When compared with ECG of 08-Jun-2022 14:25, PREVIOUS ECG IS PRESENT Confirmed by Estanislado Pandy [phone]) on 05/15/2023 7:07:08 PM  Radiology No results found.  Procedures Procedures    Medications Ordered in ED Medications - No data to display  ED Course/ Medical Decision Making/ A&P                                 Medical Decision Making Amount and/or Complexity of Data Reviewed Labs: ordered.  This patient presents to the ED for concern of abdominal pain, constipation, this involves an extensive number of treatment options, and is a complaint that carries with it a high risk of complications and morbidity.  The differential diagnosis for generalized abdominal pain includes, but is not limited to AAA, gastroenteritis, appendicitis, Bowel obstruction, Bowel perforation. Gastroparesis, DKA, Hernia, Inflammatory bowel disease, mesenteric ischemia, pancreatitis, peritonitis SBP, volvulus.   My initial workup includes labs  Additional history obtained from: Nursing notes from this visit. Previous records within EMR system ED visit for similar on  03/26/2023  I ordered, reviewed and interpreted labs which include: CMP, CBC, urinalysis, lipase.  Lipase minimally elevated to 58.  CBC with a stable anemia with a hemoglobin of 10.0.  CMP reveals creatinine near baseline at 1.61 with a GFR of 36.  Afebrile, hemodynamically stable.  62 year old female presenting to the ED for evaluation of upper abdominal pain and fullness as well as constipation.  She has been dealing with constipation for a few months now.  She follows GI for this and was prescribed Linzess but states she has  not taken it.  She states she does not want to continue taking laxatives.  She develops predictable symptoms when she does not take laxatives.  She presents today concerned for an infection.  She has no signs or symptoms of infection today.  I had a shared decision-making conversation with the patient regarding further workup including imaging and treatment in the ED for her symptoms.  She prefers to go home and trial her home medication regimen.  She was strongly encouraged to keep her GI appointment in a few weeks.  She was encouraged to return to the emergency department with any new or worsening symptoms.  Stable at discharge.  At this time there does not appear to be any evidence of an acute emergency medical condition and the patient appears stable for discharge with appropriate outpatient follow up. Diagnosis was discussed with patient who verbalizes understanding of care plan and is agreeable to discharge. I have discussed return precautions with patient who verbalizes understanding. Patient encouraged to follow-up with their PCP within 1 week. All questions answered.  Note: Portions of this report may have been transcribed using voice recognition software. Every effort was made to ensure accuracy; however, inadvertent computerized transcription errors may still be present.        Final Clinical Impression(s) / ED Diagnoses Final diagnoses:  Constipation, unspecified  constipation type  Generalized abdominal pain    Rx / DC Orders ED Discharge Orders     None         Michelle Piper, PA-C 05/15/23 2301    Coral Spikes, DO 05/15/23 2346

## 2023-05-15 NOTE — Discharge Instructions (Signed)
You have been seen today for your complaint of abdominal pain, constipation. Your lab work was reassuring and can be viewed on Pharmacologist. Your discharge medications include your home medications.  Specifically, you should try taking your Linzess. Follow up with: Your primary care provider and GI provider as scheduled Please seek immediate medical care if you develop any of the following symptoms: You have a fever and your symptoms suddenly get worse. You leak stool or have blood in your stool. Your abdomen is bloated. You have severe pain in your abdomen. You feel dizzy or you faint. At this time there does not appear to be the presence of an emergent medical condition, however there is always the potential for conditions to change. Please read and follow the below instructions.  Do not take your medicine if  develop an itchy rash, swelling in your mouth or lips, or difficulty breathing; call 911 and seek immediate emergency medical attention if this occurs.  You may review your lab tests and imaging results in their entirety on your MyChart account.  Please discuss all results of fully with your primary care provider and other specialist at your follow-up visit.  Note: Portions of this text may have been transcribed using voice recognition software. Every effort was made to ensure accuracy; however, inadvertent computerized transcription errors may still be present.

## 2023-05-15 NOTE — ED Notes (Signed)
PA Alex at bedside to evaluate patient.

## 2023-05-19 DIAGNOSIS — D539 Nutritional anemia, unspecified: Secondary | ICD-10-CM | POA: Diagnosis not present

## 2023-05-19 DIAGNOSIS — D631 Anemia in chronic kidney disease: Secondary | ICD-10-CM | POA: Diagnosis not present

## 2023-05-19 DIAGNOSIS — R809 Proteinuria, unspecified: Secondary | ICD-10-CM | POA: Diagnosis not present

## 2023-05-19 DIAGNOSIS — N183 Chronic kidney disease, stage 3 unspecified: Secondary | ICD-10-CM | POA: Diagnosis not present

## 2023-05-25 DIAGNOSIS — R809 Proteinuria, unspecified: Secondary | ICD-10-CM | POA: Diagnosis not present

## 2023-05-25 DIAGNOSIS — D509 Iron deficiency anemia, unspecified: Secondary | ICD-10-CM | POA: Diagnosis not present

## 2023-05-25 DIAGNOSIS — N183 Chronic kidney disease, stage 3 unspecified: Secondary | ICD-10-CM | POA: Diagnosis not present

## 2023-05-25 DIAGNOSIS — N269 Renal sclerosis, unspecified: Secondary | ICD-10-CM | POA: Diagnosis not present

## 2023-06-02 DIAGNOSIS — Z1211 Encounter for screening for malignant neoplasm of colon: Secondary | ICD-10-CM | POA: Diagnosis not present

## 2023-06-02 DIAGNOSIS — R194 Change in bowel habit: Secondary | ICD-10-CM | POA: Diagnosis not present

## 2023-06-07 DIAGNOSIS — J45909 Unspecified asthma, uncomplicated: Secondary | ICD-10-CM | POA: Diagnosis not present

## 2023-06-13 DIAGNOSIS — K529 Noninfective gastroenteritis and colitis, unspecified: Secondary | ICD-10-CM | POA: Diagnosis not present

## 2023-06-20 DIAGNOSIS — K529 Noninfective gastroenteritis and colitis, unspecified: Secondary | ICD-10-CM | POA: Diagnosis not present

## 2023-06-20 DIAGNOSIS — Z1211 Encounter for screening for malignant neoplasm of colon: Secondary | ICD-10-CM | POA: Diagnosis not present

## 2023-07-05 ENCOUNTER — Encounter: Payer: Self-pay | Admitting: Endocrinology

## 2023-07-05 DIAGNOSIS — Z1231 Encounter for screening mammogram for malignant neoplasm of breast: Secondary | ICD-10-CM

## 2023-07-11 DIAGNOSIS — E559 Vitamin D deficiency, unspecified: Secondary | ICD-10-CM | POA: Diagnosis not present

## 2023-07-11 DIAGNOSIS — N1832 Chronic kidney disease, stage 3b: Secondary | ICD-10-CM | POA: Diagnosis not present

## 2023-07-11 DIAGNOSIS — N644 Mastodynia: Secondary | ICD-10-CM | POA: Diagnosis not present

## 2023-07-11 DIAGNOSIS — E7800 Pure hypercholesterolemia, unspecified: Secondary | ICD-10-CM | POA: Diagnosis not present

## 2023-07-11 DIAGNOSIS — E0590 Thyrotoxicosis, unspecified without thyrotoxic crisis or storm: Secondary | ICD-10-CM | POA: Diagnosis not present

## 2023-07-14 ENCOUNTER — Other Ambulatory Visit: Payer: Self-pay | Admitting: Endocrinology

## 2023-07-14 DIAGNOSIS — N644 Mastodynia: Secondary | ICD-10-CM

## 2023-08-22 ENCOUNTER — Ambulatory Visit
Admission: RE | Admit: 2023-08-22 | Discharge: 2023-08-22 | Disposition: A | Discharge status: Home / Self Care | Payer: BC Managed Care – PPO | Source: Ambulatory Visit | Attending: Endocrinology | Admitting: Endocrinology

## 2023-08-22 ENCOUNTER — Ambulatory Visit: Payer: BC Managed Care – PPO

## 2023-08-22 DIAGNOSIS — N644 Mastodynia: Secondary | ICD-10-CM | POA: Diagnosis not present

## 2023-09-18 ENCOUNTER — Ambulatory Visit: Payer: BC Managed Care – PPO | Admitting: Internal Medicine

## 2023-09-22 ENCOUNTER — Encounter: Payer: Self-pay | Admitting: Internal Medicine

## 2023-09-22 ENCOUNTER — Ambulatory Visit (INDEPENDENT_AMBULATORY_CARE_PROVIDER_SITE_OTHER): Payer: BC Managed Care – PPO | Admitting: Internal Medicine

## 2023-09-22 VITALS — BP 128/74 | HR 84 | Ht 65.0 in | Wt 146.0 lb

## 2023-09-22 DIAGNOSIS — J069 Acute upper respiratory infection, unspecified: Secondary | ICD-10-CM

## 2023-09-22 DIAGNOSIS — E042 Nontoxic multinodular goiter: Secondary | ICD-10-CM | POA: Diagnosis not present

## 2023-09-22 DIAGNOSIS — E0590 Thyrotoxicosis, unspecified without thyrotoxic crisis or storm: Secondary | ICD-10-CM

## 2023-09-22 NOTE — Progress Notes (Unsigned)
Name: Aimee Austin  MRN/ DOB: [id number], 06-30-1961    Age/ Sex: 63 y.o., female    PCP: Felix Pacini, FNP   Reason for Endocrinology Evaluation: Hyperthyroidism      Date of Initial Endocrinology Evaluation: 08/26/2021    HPI: [name] is a 63 y.o. female with a past medical history of HTN and dyslipidemia. The patient presented for initial endocrinology clinic visit on 08/26/2021 for consultative assistance with her Hyperthyroidism.   Patient has been diagnosed with hyperthyroidism on 08/24/2021 when she was noted to have a suppressed TSH at 0.005 you IU/mL and elevated free T3 at 5 pg/mL with normal total T4 at 10.7 UG/DL.   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   Weight continues to fluctuate Continues with occasional local neck swelling and sore throat  Has noted worsening tremors  No palpitations  Denies constipation or diarrhea  Denies eye symptoms  No Biotin   Methimazole 5 mg, 1 tab Monday through Thursday     HISTORY:  Past Medical History:  Past Medical History:  Diagnosis Date   Anemia    Arthritis    Rheumatoid Arthritis 2010   Asthma    Eczema    Essential hypertension, benign 08/02/2022   Renal disorder    Strep throat    Thyroid disease    Past Surgical History:  Past Surgical History:  Procedure Laterality Date   ABDOMINAL HYSTERECTOMY  03/1998   Uterus only    Social History:  reports that she has never smoked. She has never used  smokeless tobacco. She reports that she does not drink alcohol and does not use drugs. Family History: family history includes Cancer in her mother. She was adopted.   HOME MEDICATIONS: Allergies as of 09/22/2023       Reactions   Shellfish Allergy Anaphylaxis   Sulfa Antibiotics Anaphylaxis        Medication List        Accurate as of September 22, 2023  3:02 PM. If you have any questions, ask your nurse or doctor.          albuterol 108 (90 Base) MCG/ACT inhaler Commonly known as: VENTOLIN HFA Inhale 2 puffs into the lungs every 6 (six) hours as needed for wheezing or shortness of breath.   cyclobenzaprine 5 MG tablet Commonly known as: FLEXERIL Take 1 tablet (5 mg total) by mouth 3 (three) times daily as needed for muscle spasms.   fluocinonide-emollient 0.05 % cream Commonly known as: LIDEX-E Apply 1 Application topically 3 (three) times daily.   GaviLyte-C 240 g solution Generic drug: polyethylene glycol See admin instructions.   hydrOXYzine 25 MG tablet Commonly known as: ATARAX Take 1 tablet (25 mg total) by mouth as needed.   IRON PO Take by mouth daily.   lisinopril 20 MG tablet Commonly known as: ZESTRIL Take 1 tablet (20 mg total) by mouth daily.   methimazole 5  MG tablet Commonly known as: TAPAZOLE 1 tablet daily Monday through Thursday , none on Friday  Saturday and Sundays   VITAMIN D BOOSTER PO Take 1 tablet by mouth daily.          REVIEW OF SYSTEMS: A comprehensive ROS was conducted with the patient and is negative except as per HPI    OBJECTIVE:  VS: BP 128/74 (BP Location: Left Arm, Patient Position: Sitting, Cuff Size: Normal)   Pulse 84   Ht 5\' 5"  (1.651 m)   Wt 146 lb (66.2 kg)   SpO2 97%   BMI 24.30 kg/m   Wt Readings from Last 3 Encounters:  09/22/23 146 lb (66.2 kg)  03/26/23 149 lb (67.6 kg)  03/16/23 151 lb (68.5 kg)     EXAM: General: Pt appears well and is in NAD Pharyngeal erythema noted with tonsillar  enlargement   Neck: General: Supple without adenopathy. Thyroid: Bilateral nodules appreciated on today's exam  Lungs: Clear with good BS bilat with no rales, rhonchi, or wheezes  Heart: Auscultation: RRR.  Abdomen: Normoactive bowel sounds, soft, nontender, without masses or organomegaly palpable  Extremities:  BL LE: No pretibial edema normal ROM and strength.  Mental Status: Judgment, insight: Intact Orientation: Oriented to time, place, and person Mood and affect: No depression, anxiety, or agitation     DATA REVIEWED:   Latest Reference Range & Units 03/16/23 08:46  Alkaline Phosphatase 39 - 117 U/L 78  Albumin 3.5 - 5.2 g/dL 4.6  AST 0 - 37 U/L 19  ALT 0 - 35 U/L 11  Total Protein 6.0 - 8.3 g/dL 7.5  Bilirubin, Direct 0.0 - 0.3 mg/dL 0.0  Total Bilirubin 0.2 - 1.2 mg/dL 0.3  Neutrophils 16.1 - 77.0 % 57.8  Lymphocytes 12.0 - 46.0 % 34.0  Monocytes Relative 3.0 - 12.0 % 4.2  Eosinophil 0.0 - 5.0 % 3.1  Basophil 0.0 - 3.0 % 0.9  Band Neutrophils % 0.0  TSH 0.35 - 5.50 uIU/mL 2.71  T4,Free(Direct) 0.60 - 1.60 ng/dL 0.96      Latest Reference Range & Units Most Recent  TRAB <=2.00 IU/L 3.38 (H) 12/01/21 15:51        Thyroid ultrasound  09/28/2022  Narrative & Impression  CLINICAL DATA:  Follow up on MNG. S/P benign FNA of the left inferior nodule 12/2021   EXAM: THYROID ULTRASOUND   TECHNIQUE: Ultrasound examination of the thyroid gland and adjacent soft tissues was performed.   COMPARISON:  12/20/2021   09/01/2021   09/03/2016   FINDINGS: Parenchymal Echotexture: Mildly heterogenous   Isthmus: 0.3 cm   Right lobe: 4.5 x 2.1 x 2.4 cm   Left lobe: 5.6 x 2.8 x 2.7 cm   _________________________________________________________   Estimated total number of nodules >/= 1 cm: 4   Number of spongiform nodules >/=  2 cm not described below (TR1): 0   Number of mixed cystic and solid nodules >/= 1.5 cm not described below (TR2): 0    _________________________________________________________   Nodule 1: 1.7 x 0.9 x 0.8 cm right mid thyroid nodule is not significantly changed in size since 07/04/2016 where it measured 1.7 x 1.3 x 1.1 cm which is consistent with a benign etiology.   Nodule 2: 0.7 x 0.6 x 0.4 cm spongiform right mid thyroid nodule does not meet criteria for imaging surveillance or FNA.   Nodule 3: 1.4 x 1.2 x 0.7 cm solid isoechoic right mid thyroid nodule (TI-RADS 3) is not significantly changed in size  since prior examination and does not meet criteria for FNA or imaging follow-up.   Nodule 4: 1.2 x 1.0 x 0.7 cm solid isoechoic right mid thyroid nodule is not significantly changed from prior examination and does not meet criteria for FNA or imaging follow-up.   Nodule 5: 3.2 x 2.8 x 2.6 cm solid isoechoic left thyroid nodule is not significantly changed in size since prior examination. Please correlate with prior FNA results from 12/20/2021.   IMPRESSION: 1. Previously biopsied left thyroid nodule is unchanged in size. Please correlate with prior FNA results from 12/20/2021. 2. Remaining right thyroid nodules do not meet criteria for FNA or imaging follow-up.        FNA right mid nodule 07/15/2016  Benign cytology    FNA left inferior nodule 07/15/2016  Benign cytology     FNA left inferior nodule 12/20/2021   - Consistent with benign follicular nodule (Bethesda category II)  Some cells have Hurthle cell features but present mostly as flat,  honeycomb sheets, in the presence of colloid.  These findings are most  characteristic for a benign follicular nodule rather than a Hurthle cell  neoplasm.   ASSESSMENT/PLAN/RECOMMENDATIONS:   Hyperthyroidism:   -Patient with multiple nonspecific symptoms that cannot be attributed to her thyroid at this time as her levels are within normal range -Patient with Graves' disease due to elevated TRAb, cannot exclude toxic thyroid nodules as  well -We discussed alternative therapy  to include radiofrequency ablation, RAI ablation vs total thyroidectomy in the past -Clinically it appears this is more of Graves' disease as she is needing less methimazole over time - TFT's remain normal, will decrease methimazole as below -White cell count and LFTs are normal   Medications :  methimazole 5 mg , 1 tablet Monday through Thursday   (skip Friday, Saturdays and Sundays)   2. MNG:  - No local neck symptoms  - She is S/P benign FNA of the right and left thyroid nodules in 2017, we repeated FNA of the right inferior nodule due to enlarge and the size in 12/2021 with benign cytology -Thyroid ultrasound 09/2022 has been stable  3.  URI  -Encouraged hydration, Tylenol, and symptomatic relief with lozenges -Follow-up with PCP if symptoms are not improving     F/U in 6 months     Signed electronically by: Lyndle Herrlich, MD  [facility] End[facility]  [facility] [address] [place], [place] [address] Phone: [phone] FAX: [phone]   CC: Felix Pacini, FNP [address] [place] [address] Phone: [phone] Fax: [phone]   Return to Endocrinology clinic as below: No future appointments.

## 2023-09-23 LAB — T3, FREE: T3, Free: 2.9 pg/mL (ref 2.3–4.2)

## 2023-09-23 LAB — TSH: TSH: 1.66 m[IU]/L (ref 0.40–4.50)

## 2023-09-23 LAB — T4, FREE: Free T4: 1 ng/dL (ref 0.8–1.8)

## 2023-09-25 ENCOUNTER — Encounter: Payer: Self-pay | Admitting: Internal Medicine

## 2023-09-25 MED ORDER — METHIMAZOLE 5 MG PO TABS: ORAL_TABLET | ORAL | 3 refills | Status: DC

## 2023-10-04 ENCOUNTER — Ambulatory Visit
Admission: RE | Admit: 2023-10-04 | Discharge: 2023-10-04 | Disposition: A | Discharge status: Home / Self Care | Payer: BC Managed Care – PPO | Source: Ambulatory Visit | Attending: Internal Medicine | Admitting: Internal Medicine

## 2023-10-04 DIAGNOSIS — D649 Anemia, unspecified: Secondary | ICD-10-CM | POA: Diagnosis not present

## 2023-10-04 DIAGNOSIS — L309 Dermatitis, unspecified: Secondary | ICD-10-CM | POA: Diagnosis not present

## 2023-10-04 DIAGNOSIS — E538 Deficiency of other specified B group vitamins: Secondary | ICD-10-CM | POA: Diagnosis not present

## 2023-10-04 DIAGNOSIS — Z79899 Other long term (current) drug therapy: Secondary | ICD-10-CM | POA: Diagnosis not present

## 2023-10-04 DIAGNOSIS — J45909 Unspecified asthma, uncomplicated: Secondary | ICD-10-CM | POA: Diagnosis not present

## 2023-10-04 DIAGNOSIS — E042 Nontoxic multinodular goiter: Secondary | ICD-10-CM | POA: Diagnosis not present

## 2023-10-04 DIAGNOSIS — M79609 Pain in unspecified limb: Secondary | ICD-10-CM | POA: Diagnosis not present

## 2023-10-06 LAB — LAB REPORT - SCANNED: EGFR: 32

## 2023-10-09 ENCOUNTER — Encounter: Payer: Self-pay | Admitting: Internal Medicine

## 2023-10-20 DIAGNOSIS — J0100 Acute maxillary sinusitis, unspecified: Secondary | ICD-10-CM | POA: Diagnosis not present

## 2023-10-20 DIAGNOSIS — J040 Acute laryngitis: Secondary | ICD-10-CM | POA: Diagnosis not present

## 2023-11-10 DIAGNOSIS — N183 Chronic kidney disease, stage 3 unspecified: Secondary | ICD-10-CM | POA: Diagnosis not present

## 2023-11-10 DIAGNOSIS — N051 Unspecified nephritic syndrome with focal and segmental glomerular lesions: Secondary | ICD-10-CM | POA: Diagnosis not present

## 2023-11-10 DIAGNOSIS — D509 Iron deficiency anemia, unspecified: Secondary | ICD-10-CM | POA: Diagnosis not present

## 2023-11-10 DIAGNOSIS — D649 Anemia, unspecified: Secondary | ICD-10-CM | POA: Diagnosis not present

## 2023-11-15 ENCOUNTER — Emergency Department (HOSPITAL_COMMUNITY)

## 2023-11-15 ENCOUNTER — Other Ambulatory Visit: Payer: Self-pay

## 2023-11-15 ENCOUNTER — Encounter (HOSPITAL_COMMUNITY): Payer: Self-pay

## 2023-11-15 ENCOUNTER — Emergency Department (HOSPITAL_COMMUNITY): Admission: EM | Admit: 2023-11-15 | Discharge: 2023-11-15 | Disposition: A | Discharge status: Home / Self Care

## 2023-11-15 DIAGNOSIS — R0789 Other chest pain: Secondary | ICD-10-CM | POA: Diagnosis not present

## 2023-11-15 DIAGNOSIS — Z79899 Other long term (current) drug therapy: Secondary | ICD-10-CM | POA: Diagnosis not present

## 2023-11-15 DIAGNOSIS — N189 Chronic kidney disease, unspecified: Secondary | ICD-10-CM | POA: Diagnosis not present

## 2023-11-15 DIAGNOSIS — R059 Cough, unspecified: Secondary | ICD-10-CM | POA: Insufficient documentation

## 2023-11-15 DIAGNOSIS — I10 Essential (primary) hypertension: Secondary | ICD-10-CM | POA: Diagnosis not present

## 2023-11-15 DIAGNOSIS — R0602 Shortness of breath: Secondary | ICD-10-CM | POA: Diagnosis not present

## 2023-11-15 DIAGNOSIS — J45909 Unspecified asthma, uncomplicated: Secondary | ICD-10-CM | POA: Diagnosis not present

## 2023-11-15 DIAGNOSIS — I129 Hypertensive chronic kidney disease with stage 1 through stage 4 chronic kidney disease, or unspecified chronic kidney disease: Secondary | ICD-10-CM | POA: Diagnosis not present

## 2023-11-15 DIAGNOSIS — R079 Chest pain, unspecified: Secondary | ICD-10-CM | POA: Diagnosis not present

## 2023-11-15 LAB — CBC
HCT: 29.2 % — ABNORMAL LOW (ref 36.0–46.0)
Hemoglobin: 9.6 g/dL — ABNORMAL LOW (ref 12.0–15.0)
MCH: 25.1 pg — ABNORMAL LOW (ref 26.0–34.0)
MCHC: 32.9 g/dL (ref 30.0–36.0)
MCV: 76.2 fL — ABNORMAL LOW (ref 80.0–100.0)
Platelets: 193 10*3/uL (ref 150–400)
RBC: 3.83 MIL/uL — ABNORMAL LOW (ref 3.87–5.11)
RDW: 14.1 % (ref 11.5–15.5)
WBC: 7.7 10*3/uL (ref 4.0–10.5)
nRBC: 0 % (ref 0.0–0.2)

## 2023-11-15 LAB — BASIC METABOLIC PANEL
Anion gap: 8 (ref 5–15)
BUN: 20 mg/dL (ref 8–23)
CO2: 22 mmol/L (ref 22–32)
Calcium: 9.4 mg/dL (ref 8.9–10.3)
Chloride: 110 mmol/L (ref 98–111)
Creatinine, Ser: 1.48 mg/dL — ABNORMAL HIGH (ref 0.44–1.00)
GFR, Estimated: 40 mL/min — ABNORMAL LOW (ref >60)
Glucose, Bld: 96 mg/dL (ref 70–99)
Potassium: 4.3 mmol/L (ref 3.5–5.1)
Sodium: 140 mmol/L (ref 135–145)

## 2023-11-15 LAB — TROPONIN I (HIGH SENSITIVITY)
Troponin I (High Sensitivity): <2 ng/L (ref <18)
Troponin I (High Sensitivity): 2 ng/L (ref <18)

## 2023-11-15 MED ORDER — ALBUTEROL SULFATE HFA 108 (90 BASE) MCG/ACT IN AERS
2.0000 | INHALATION_SPRAY | Freq: Once | RESPIRATORY_TRACT | Status: AC
  Administered 2023-11-15: 2 via RESPIRATORY_TRACT
  Filled 2023-11-15: qty 6.7

## 2023-11-15 NOTE — ED Provider Notes (Signed)
 Aimee Austin EMERGENCY DEPARTMENT AT [facility] Provider Note   CSN: [id number] Arrival date & time: 11/15/23  1233     History  Chief Complaint  Patient presents with   Cough   Chest Pain    Aimee Austin is a 63 y.o. female.  This is a 63 year old female presenting emergency department for chest pain.  Reports that she has had URI symptoms with cough for the past 2 weeks.  Went to see her primary doctor today, did an EKG and was concerned given her complaint of chest pain and sent her to the emergency department.  She states it feels like it is her chest wall, reproducible with touch.  Low risk for PE based on Wells criteria.  Reportedly had stress testing 2 years ago which was reportedly normal.  No history of cardiac disease.   Cough Associated symptoms: chest pain   Chest Pain Associated symptoms: cough        Home Medications Prior to Admission medications   Medication Sig Start Date End Date Taking? Authorizing Provider  albuterol (VENTOLIN HFA) 108 (90 Base) MCG/ACT inhaler Inhale 2 puffs into the lungs every 6 (six) hours as needed for wheezing or shortness of breath. 08/02/22   Arnette Felts, FNP  cyclobenzaprine (FLEXERIL) 5 MG tablet Take 1 tablet (5 mg total) by mouth 3 (three) times daily as needed for muscle spasms. Patient not taking: Reported on 09/22/2023 09/27/22   Arnette Felts, FNP  Ferrous Sulfate (IRON PO) Take by mouth daily.    [provider]  fluocinonide-emollient (LIDEX-E) 0.05 % cream Apply 1 Application topically 3 (three) times daily. Patient not taking: Reported on 09/22/2023 08/02/22   Arnette Felts, FNP  GAVILYTE-C 240 g solution See admin instructions. Patient not taking: Reported on 09/22/2023 05/29/23   [provider]  hydrOXYzine (ATARAX) 25 MG tablet Take 1 tablet (25 mg total) by mouth as needed. 08/02/22   Arnette Felts, FNP  lisinopril (ZESTRIL) 20 MG tablet Take 1 tablet (20 mg total) by mouth daily. 08/02/22    Arnette Felts, FNP  methimazole (TAPAZOLE) 5 MG tablet 1 tablet daily Monday through Thursday , none on Friday  Saturday and Sundays 09/25/23   Shamleffer, Konrad Dolores, MD  Nutritional Supplements (VITAMIN D BOOSTER PO) Take 1 tablet by mouth daily. Patient not taking: Reported on 09/22/2023    [provider]      Allergies    Shellfish allergy and Sulfa antibiotics    Review of Systems   Review of Systems  Respiratory:  Positive for cough.   Cardiovascular:  Positive for chest pain.    Physical Exam Updated Vital Signs BP (!) 165/92 (BP Location: Right Arm)   Pulse 85   Temp 98.1 F (36.7 C) (Oral)   Resp 18   SpO2 100%  Physical Exam Vitals and nursing note reviewed.  Constitutional:      General: She is not in acute distress.    Appearance: She is not toxic-appearing.  HENT:     Head: Normocephalic.  Cardiovascular:     Rate and Rhythm: Normal rate.     Pulses:          Radial pulses are 2+ on the right side and 2+ on the left side.  Pulmonary:     Breath sounds: Normal breath sounds.  Chest:     Chest wall: Tenderness present.  Musculoskeletal:     Right lower leg: No edema.     Left lower leg: No  edema.  Skin:    General: Skin is warm.     Capillary Refill: Capillary refill takes less than 2 seconds.  Neurological:     Mental Status: She is oriented to person, place, and time.  Psychiatric:        Mood and Affect: Mood normal.        Behavior: Behavior normal.     ED Results / Procedures / Treatments   Labs (all labs ordered are listed, but only abnormal results are displayed) Labs Reviewed  BASIC METABOLIC PANEL - Abnormal; Notable for the following components:      Result Value   Creatinine, Ser 1.48 (*)    GFR, Estimated 40 (*)    All other components within normal limits  CBC - Abnormal; Notable for the following components:   RBC 3.83 (*)    Hemoglobin 9.6 (*)    HCT 29.2 (*)    MCV 76.2 (*)    MCH 25.1 (*)    All other  components within normal limits  TROPONIN I (HIGH SENSITIVITY)  TROPONIN I (HIGH SENSITIVITY)    EKG EKG Interpretation Date/Time:  Wednesday November 15 2023 12:38:18 EDT Ventricular Rate:  66 PR Interval:  182 QRS Duration:  88 QT Interval:  364 QTC Calculation: 381 R Axis:   77  Text Interpretation: Normal sinus rhythm Normal ECG When compared with ECG of 15-May-2023 17:49, PREVIOUS ECG IS PRESENT Confirmed by Estanislado Pandy [phone]) on 11/15/2023 3:32:38 PM  Radiology DG Chest 2 View Result Date: 11/15/2023 CLINICAL DATA:  Chest pain.  Cough. EXAM: CHEST - 2 VIEW COMPARISON:  06/26/2018. FINDINGS: Bilateral lung fields are clear. Bilateral costophrenic angles are clear. Normal cardio-mediastinal silhouette. No acute osseous abnormalities. The soft tissues are within normal limits. IMPRESSION: No active cardiopulmonary disease. Electronically Signed   By: Jules Schick M.D.   On: 11/15/2023 15:30    Procedures Procedures    Medications Ordered in ED Medications  albuterol (VENTOLIN HFA) 108 (90 Base) MCG/ACT inhaler 2 puff (2 puffs Inhalation Given 11/15/23 1633)    ED Course/ Medical Decision Making/ A&P Clinical Course as of 11/15/23 1641  Wed Nov 15, 2023  1552 EKG on my independent interpretation appears to be normal sinus rhythm.  Normal intervals.  No QTc prolongation.  No ST segment changes that would indicate ischemia.  Appears similar to prior. [TY]  1553 DG Chest 2 View Chest x-ray without pneumonia or pneumothorax.  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   [TY]  1553 Troponin I (High Sensitivity) Troponin negative x 2.  ACS unlikely. [TY]  1554 Basic metabolic panel(!) No significant metabolic derangements.  Appears kidney function is at baseline per review of old labs. [TY]  1554 CBC(!) No leukocytosis to suggest systemic infection.  Appears to have anemia.  It is microcytic and appears chronic and stable per chart  review. [TY]  1555 Echo 2022 per chart review: "IMPRESSIONS     1. Left ventricular ejection fraction, by estimation, is 60 to 65%. The  left ventricle has normal function. The left ventricle has no regional  wall motion abnormalities. Left ventricular diastolic parameters were  normal.   2. Right ventricular systolic function is normal. The right ventricular  size is normal.   3. The mitral valve is normal in structure. Mild mitral valve  regurgitation. No evidence of mitral stenosis.   4. The aortic valve is normal in structure. Aortic valve regurgitation is  not visualized. No aortic  stenosis is present.   5. The inferior vena cava is normal in size with greater than 50%  respiratory variability, suggesting right atrial pressure of 3 mmHg.   " [TY]    Clinical Course User Index [TY] Coral Spikes, DO                                 Medical Decision Making This is a 63 year old female presenting emergency department for chest pain.  Afebrile nontachycardic, slightly hypertensive.  Per chart review does have complex past medical history to include asthma, CKD, hypertension.  Workup as noted in ED course reassuring.  ACS unlikely.  History not consistent with PE, low risk Wells score.  Chest x-ray without pneumonia pneumothorax, no widened mediastinum to suggest dissection.  Equal pulses.  Given albuterol MDI here, discussed supportive care for MSK/costochondritis.  Follow-up with primary doctor.  She does have some risk factors for cardiac etiology, but would like to follow-up with primary doctor first rather than going to cardiology.  Feels is reasonable given negative cardiac testing 2 years ago.  Will discharge in stable condition.  Amount and/or Complexity of Data Reviewed Independent Historian:     Details: Her daughter notes EKG was reportedly abnormal External Data Reviewed:     Details: See ED course Labs: ordered. Decision-making details documented in ED Course.     Details: See ED course Radiology: ordered. Decision-making details documented in ED Course.    Details: See ED course ECG/medicine tests: independent interpretation performed.    Details: See ED course  Risk Prescription drug management. Decision regarding hospitalization.         Final Clinical Impression(s) / ED Diagnoses Final diagnoses:  None    Rx / DC Orders ED Discharge Orders     None         Coral Spikes, DO 11/15/23 1641

## 2023-11-15 NOTE — ED Triage Notes (Signed)
 Pt arrives via EMS from UC. Pt reports dry cough, chest discomfort, and intermittent sob for the past 2 weeks. UC administered 324mg  of aspirin. Pt is AxOx4. NAD.

## 2023-11-15 NOTE — ED Notes (Signed)
 ED Provider at bedside.

## 2023-11-15 NOTE — ED Provider Triage Note (Signed)
 Emergency Medicine Provider Triage Evaluation Note  Aimee Austin , a 63 y.o. female  was evaluated in triage.  Pt complains of cough, chest pain.  Patient sent from urgent care for evaluation.  She complains of persistent cough times at least 1 week.  She reports associated anterior chest discomfort with same.  Review of Systems  Positive: Cough, chest discomfort Negative: Fever, shortness of breath  Physical Exam  BP (!) 156/81   Pulse 71   Temp 98.1 F (36.7 C) (Oral)   Resp 18   SpO2 100%  Gen:   Awake, no distress   Resp:  Normal effort  MSK:   Moves extremities without difficulty  Other:    Medical Decision Making  Medically screening exam initiated at 12:46 PM.  Appropriate orders placed.  Aimee Austin was informed that the remainder of the evaluation will be completed by another provider, this initial triage assessment does not replace that evaluation, and the importance of remaining in the ED until their evaluation is complete.  Patient understands need to remain in the ED until completion of evaluation.   Wynetta Fines, MD 11/15/23 1255

## 2023-11-15 NOTE — Discharge Instructions (Signed)
 Please continue to take your inhalers.  He may try over-the-counter medication such as Tylenol for pain relief.  Please follow-up with your primary doctor.  Return immediately felt fevers, chills, headache, palpitations, passout, worsening chest pain, shortness of breath or any new or worsening symptoms that are concerning to you.

## 2023-11-15 NOTE — ED Notes (Signed)
 Pt was inquiring about next steps. RN informed pt the EDP or Paramedic will be by shortly to discuss next steps. Pt verbalized understanding. No additional questions at this time.

## 2023-11-15 NOTE — ED Notes (Signed)
 Patient transported to X-ray

## 2023-12-08 DIAGNOSIS — N183 Chronic kidney disease, stage 3 unspecified: Secondary | ICD-10-CM | POA: Diagnosis not present

## 2023-12-25 ENCOUNTER — Other Ambulatory Visit: Payer: Self-pay | Admitting: Family Medicine

## 2023-12-25 DIAGNOSIS — R0781 Pleurodynia: Secondary | ICD-10-CM | POA: Diagnosis not present

## 2023-12-25 DIAGNOSIS — R52 Pain, unspecified: Secondary | ICD-10-CM | POA: Diagnosis not present

## 2023-12-25 DIAGNOSIS — R109 Unspecified abdominal pain: Secondary | ICD-10-CM | POA: Diagnosis not present

## 2023-12-27 ENCOUNTER — Encounter: Payer: Self-pay | Admitting: Family Medicine

## 2024-01-01 ENCOUNTER — Ambulatory Visit
Admission: RE | Admit: 2024-01-01 | Discharge: 2024-01-01 | Disposition: A | Discharge status: Home / Self Care | Source: Ambulatory Visit | Attending: Family Medicine | Admitting: Family Medicine

## 2024-01-01 DIAGNOSIS — R0781 Pleurodynia: Secondary | ICD-10-CM

## 2024-01-19 DIAGNOSIS — M546 Pain in thoracic spine: Secondary | ICD-10-CM | POA: Diagnosis not present

## 2024-01-19 DIAGNOSIS — R202 Paresthesia of skin: Secondary | ICD-10-CM | POA: Diagnosis not present

## 2024-02-01 DIAGNOSIS — I10 Essential (primary) hypertension: Secondary | ICD-10-CM | POA: Diagnosis not present

## 2024-02-01 DIAGNOSIS — M79605 Pain in left leg: Secondary | ICD-10-CM | POA: Diagnosis not present

## 2024-02-01 DIAGNOSIS — M62830 Muscle spasm of back: Secondary | ICD-10-CM | POA: Diagnosis not present

## 2024-02-21 DIAGNOSIS — R399 Unspecified symptoms and signs involving the genitourinary system: Secondary | ICD-10-CM | POA: Diagnosis not present

## 2024-02-21 DIAGNOSIS — R1030 Lower abdominal pain, unspecified: Secondary | ICD-10-CM | POA: Diagnosis not present

## 2024-03-11 DIAGNOSIS — K5900 Constipation, unspecified: Secondary | ICD-10-CM | POA: Diagnosis not present

## 2024-03-14 DIAGNOSIS — D649 Anemia, unspecified: Secondary | ICD-10-CM | POA: Diagnosis not present

## 2024-03-14 DIAGNOSIS — K5904 Chronic idiopathic constipation: Secondary | ICD-10-CM | POA: Diagnosis not present

## 2024-03-14 DIAGNOSIS — R194 Change in bowel habit: Secondary | ICD-10-CM | POA: Diagnosis not present

## 2024-03-14 DIAGNOSIS — K5900 Constipation, unspecified: Secondary | ICD-10-CM | POA: Diagnosis not present

## 2024-03-14 DIAGNOSIS — R1084 Generalized abdominal pain: Secondary | ICD-10-CM | POA: Diagnosis not present

## 2024-03-26 ENCOUNTER — Encounter: Payer: Self-pay | Admitting: Internal Medicine

## 2024-03-26 ENCOUNTER — Ambulatory Visit (INDEPENDENT_AMBULATORY_CARE_PROVIDER_SITE_OTHER): Payer: 59 | Admitting: Internal Medicine

## 2024-03-26 VITALS — BP 136/72 | HR 80 | Ht 65.0 in | Wt 146.0 lb

## 2024-03-26 DIAGNOSIS — E0590 Thyrotoxicosis, unspecified without thyrotoxic crisis or storm: Secondary | ICD-10-CM | POA: Diagnosis not present

## 2024-03-26 DIAGNOSIS — E042 Nontoxic multinodular goiter: Secondary | ICD-10-CM

## 2024-03-26 LAB — T3, FREE: T3, Free: 3.4 pg/mL (ref 2.3–4.2)

## 2024-03-26 LAB — TSH: TSH: 1.29 m[IU]/L (ref 0.40–4.50)

## 2024-03-26 LAB — T4, FREE: Free T4: 1.2 ng/dL (ref 0.8–1.8)

## 2024-03-26 NOTE — Progress Notes (Unsigned)
 Name: Aimee Austin  MRN/ DOB: [id number], 1960/11/23    Age/ Sex: 63 y.o., female    PCP: Verena Mems, MD   Reason for Endocrinology Evaluation: Hyperthyroidism      Date of Initial Endocrinology Evaluation: 08/26/2021    HPI: [name] is a 63 y.o. female with a past medical history of HTN and dyslipidemia. The patient presented for initial endocrinology clinic visit on 08/26/2021 for consultative assistance with her Hyperthyroidism.   Patient has been diagnosed with hyperthyroidism on 08/24/2021 when she was noted to have a suppressed TSH at 0.005 you IU/mL and elevated free T3 at 5 pg/mL with normal total T4 at 10.7 UG/DL.   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   Weight is stable  No local neck swelling  No  palpitations  Tremors have improved  Has constipation  since June, 2025- has been taking laxatives and smooth move tea  No Biotin   Methimazole  5 mg, 1 tab Monday through Thursday     HISTORY:  Past Medical History:  Past Medical History:  Diagnosis Date   Anemia    Arthritis    Rheumatoid Arthritis 2010   Asthma    Eczema    Essential hypertension, benign 08/02/2022   Renal disorder    Strep throat    Thyroid  disease    Past Surgical History:  Past Surgical History:  Procedure Laterality Date   ABDOMINAL HYSTERECTOMY  03/1998   Uterus only    Social History:  reports that she has never smoked. She has never used smokeless tobacco. She reports that  she does not drink alcohol and does not use drugs. Family History: family history includes Cancer in her mother. She was adopted.   HOME MEDICATIONS: Allergies as of 03/26/2024       Reactions   Shellfish Allergy Anaphylaxis   Sulfa Antibiotics Anaphylaxis        Medication List        Accurate as of March 26, 2024  1:04 PM. If you have any questions, ask your nurse or doctor.          albuterol  108 (90 Base) MCG/ACT inhaler Commonly known as: VENTOLIN  HFA Inhale 2 puffs into the lungs every 6 (six) hours as needed for wheezing or shortness of breath.   cyclobenzaprine  5 MG tablet Commonly known as: FLEXERIL  Take 1 tablet (5 mg total) by mouth 3 (three) times daily as needed for muscle spasms.   fluocinonide -emollient 0.05 % cream Commonly known as: LIDEX -E Apply 1 Application topically 3 (three) times daily.   GaviLyte-C 240 g solution Generic drug: polyethylene glycol See admin instructions.   hydrOXYzine  25 MG tablet Commonly known as: ATARAX  Take 1 tablet (25 mg total) by mouth as needed.   IRON PO Take by mouth daily.   lisinopril  20 MG tablet Commonly known as: ZESTRIL  Take 1 tablet (20 mg total) by mouth daily.   methimazole  5  MG tablet Commonly known as: TAPAZOLE  1 tablet daily Monday through Thursday , none on Friday  Saturday and Sundays   VITAMIN D  BOOSTER PO Take 1 tablet by mouth daily.          REVIEW OF SYSTEMS: A comprehensive ROS was conducted with the patient and is negative except as per HPI    OBJECTIVE:  VS: There were no vitals taken for this visit.  Wt Readings from Last 3 Encounters:  09/22/23 146 lb (66.2 kg)  03/26/23 149 lb (67.6 kg)  03/16/23 151 lb (68.5 kg)     EXAM: General: Pt appears well and is in NAD Pharyngeal erythema noted with tonsillar enlargement   Neck: General: Supple without adenopathy. Thyroid : Bilateral nodules appreciated on today's exam  Lungs: Clear with good BS bilat with no rales,  rhonchi, or wheezes  Heart: Auscultation: RRR.  Abdomen: Normoactive bowel sounds, soft, nontender, without masses or organomegaly palpable  Extremities:  BL LE: No pretibial edema normal ROM and strength.  Mental Status: Judgment, insight: Intact Orientation: Oriented to time, place, and person Mood and affect: No depression, anxiety, or agitation     DATA REVIEWED:   Latest Reference Range & Units 09/22/23 15:12  TSH 0.40 - 4.50 mIU/L 1.66  Triiodothyronine,Free,Serum 2.3 - 4.2 pg/mL 2.9  T4,Free(Direct) 0.8 - 1.8 ng/dL 1.0      Latest Reference Range & Units 03/16/23 08:46  Alkaline Phosphatase 39 - 117 U/L 78  Albumin 3.5 - 5.2 g/dL 4.6  AST 0 - 37 U/L 19  ALT 0 - 35 U/L 11  Total Protein 6.0 - 8.3 g/dL 7.5  Bilirubin, Direct 0.0 - 0.3 mg/dL 0.0  Total Bilirubin 0.2 - 1.2 mg/dL 0.3  Neutrophils 56.9 - 77.0 % 57.8  Lymphocytes 12.0 - 46.0 % 34.0  Monocytes Relative 3.0 - 12.0 % 4.2  Eosinophil 0.0 - 5.0 % 3.1  Basophil 0.0 - 3.0 % 0.9  Band Neutrophils % 0.0  TSH 0.35 - 5.50 uIU/mL 2.71  T4,Free(Direct) 0.60 - 1.60 ng/dL 9.25      Latest Reference Range & Units Most Recent  TRAB <=2.00 IU/L 3.38 (H) 12/01/21 15:51        Thyroid  ultrasound  09/28/2022  Narrative & Impression  CLINICAL DATA:  Follow up on MNG. S/P benign FNA of the left inferior nodule 12/2021   EXAM: THYROID  ULTRASOUND   TECHNIQUE: Ultrasound examination of the thyroid  gland and adjacent soft tissues was performed.   COMPARISON:  12/20/2021   09/01/2021   09/03/2016   FINDINGS: Parenchymal Echotexture: Mildly heterogenous   Isthmus: 0.3 cm   Right lobe: 4.5 x 2.1 x 2.4 cm   Left lobe: 5.6 x 2.8 x 2.7 cm   _________________________________________________________   Estimated total number of nodules >/= 1 cm: 4   Number of spongiform nodules >/=  2 cm not described below (TR1): 0   Number of mixed cystic and solid nodules >/= 1.5 cm not described below (TR2): 0    _________________________________________________________   Nodule 1: 1.7 x 0.9 x 0.8 cm right mid thyroid  nodule is not significantly changed in size since 07/04/2016 where it measured 1.7 x 1.3 x 1.1 cm which is consistent with a benign etiology.   Nodule 2: 0.7 x 0.6 x 0.4 cm spongiform right mid thyroid  nodule does not meet criteria for imaging surveillance or FNA.   Nodule 3: 1.4 x 1.2 x 0.7 cm solid isoechoic right mid thyroid  nodule (TI-RADS 3) is not significantly changed in  size since prior examination and does not meet criteria for FNA or imaging follow-up.   Nodule 4: 1.2 x 1.0 x 0.7 cm solid isoechoic right mid thyroid  nodule is not significantly changed from prior examination and does not meet criteria for FNA or imaging follow-up.   Nodule 5: 3.2 x 2.8 x 2.6 cm solid isoechoic left thyroid  nodule is not significantly changed in size since prior examination. Please correlate with prior FNA results from 12/20/2021.   IMPRESSION: 1. Previously biopsied left thyroid  nodule is unchanged in size. Please correlate with prior FNA results from 12/20/2021. 2. Remaining right thyroid  nodules do not meet criteria for FNA or imaging follow-up.        FNA right mid nodule 07/15/2016  Benign cytology    FNA left inferior nodule 07/15/2016  Benign cytology     FNA left inferior nodule 12/20/2021   - Consistent with benign follicular nodule (Bethesda category II)  Some cells have Hurthle cell features but present mostly as flat,  honeycomb sheets, in the presence of colloid.  These findings are most  characteristic for a benign follicular nodule rather than a Hurthle cell  neoplasm.   ASSESSMENT/PLAN/RECOMMENDATIONS:   Hyperthyroidism:   -Patient with Graves' disease due to elevated TRAb, cannot exclude toxic thyroid  nodules as well -Patient is clinically euthyroid -TFTs normal, no change  Medications : Continue methimazole  5 mg , 1 tablet Monday through  Thursday   (skip Friday, Saturdays and Sundays)  2. MNG:  - No local neck symptoms  - She is S/P benign FNA of the right and left thyroid  nodules in 2017, we repeated FNA of the right inferior nodule due to enlarge and the size in 12/2021 with benign cytology -Thyroid  ultrasound 09/2022 has been stable -We will proceed with repeat thyroid  ultrasound    F/U in 6 months     Signed electronically by: Stefano Redgie Butts, MD  [facility] Endocrinology  [facility] [address] [place] [place], [place] [address] Phone: [phone] FAX: [phone]   CC: Verena Mems, MD [address] [place] [address] Phone: [phone] Fax: [phone]   Return to Endocrinology clinic as below: Future Appointments  Date Time Provider Department Center  03/26/2024  3:00 PM Derrell Milanes, Donell Redgie, MD [facility] None  05/09/2024  3:40 PM Thukkani, Arun K, MD [facility]

## 2024-03-27 ENCOUNTER — Ambulatory Visit: Payer: Self-pay | Admitting: Internal Medicine

## 2024-04-09 DIAGNOSIS — K5909 Other constipation: Secondary | ICD-10-CM | POA: Diagnosis not present

## 2024-04-09 DIAGNOSIS — N183 Chronic kidney disease, stage 3 unspecified: Secondary | ICD-10-CM | POA: Diagnosis not present

## 2024-04-15 DIAGNOSIS — N888 Other specified noninflammatory disorders of cervix uteri: Secondary | ICD-10-CM | POA: Diagnosis not present

## 2024-04-15 DIAGNOSIS — R1030 Lower abdominal pain, unspecified: Secondary | ICD-10-CM | POA: Diagnosis not present

## 2024-04-19 DIAGNOSIS — E0590 Thyrotoxicosis, unspecified without thyrotoxic crisis or storm: Secondary | ICD-10-CM | POA: Diagnosis not present

## 2024-04-19 DIAGNOSIS — L816 Other disorders of diminished melanin formation: Secondary | ICD-10-CM | POA: Diagnosis not present

## 2024-04-19 DIAGNOSIS — Z13220 Encounter for screening for lipoid disorders: Secondary | ICD-10-CM | POA: Diagnosis not present

## 2024-04-19 DIAGNOSIS — Z0000 Encounter for general adult medical examination without abnormal findings: Secondary | ICD-10-CM | POA: Diagnosis not present

## 2024-04-19 DIAGNOSIS — N183 Chronic kidney disease, stage 3 unspecified: Secondary | ICD-10-CM | POA: Diagnosis not present

## 2024-04-19 DIAGNOSIS — K5904 Chronic idiopathic constipation: Secondary | ICD-10-CM | POA: Diagnosis not present

## 2024-04-19 DIAGNOSIS — Z124 Encounter for screening for malignant neoplasm of cervix: Secondary | ICD-10-CM | POA: Diagnosis not present

## 2024-04-30 NOTE — Progress Notes (Unsigned)
 Cardiology Office Note:   Date:  05/09/2024  ID:  Aimee Austin, DOB Nov 08, 1960, MRN [id number] PCP:  Chet Mad, DO  [facility] Providers Cardiologist:  Wendel Haws, MD Referring MD: Verena Mems, MD  Chief Complaint/Reason for Referral: Chest pain ASSESSMENT:    1. Precordial pain   2. Stage 3b chronic kidney disease (HCC)   3. Hyperthyroidism   4. Iron deficiency anemia, unspecified iron deficiency anemia type     PLAN:   In order of problems listed above: Chest pain: The patient's chest pain symptoms have resolved once her upper respiratory tract symptoms resolved.  She walks 30 minutes without any issues with angina or shortness of breath.  No further cardiac evaluation is necessary.  Will keep follow-up open-ended. Stage IIIb chronic kidney disease: Continue lisinopril  20 mg daily; consider SGLT2 inhibitor in the future. Hyperthyroidism: Continue methimazole  5 mg Monday through Thursday Iron deficiency anemia: Continue ferrous sulfate.            Dispo:  Return if symptoms worsen or fail to improve.       I spent 38 minutes reviewing all clinical data during and prior to this visit including all relevant imaging studies, laboratories, clinical information from other health systems and prior notes from both Cardiology and other specialties, interviewing the patient, conducting a complete physical examination, and coordinating care in order to formulate a comprehensive and personalized evaluation and treatment plan.   History of Present Illness:    FOCUSED PROBLEM LIST:   CKD stage IIIb Iron deficiency anemia Hyperthyroidism Asthma BMI 29 May 2024:  Patient consents to use of AI scribe. The patient is a 63 year old female with above listed medical problems referred for recommendations regarding chest pain.  The patient presented to the emergency department in March 2025.  She reported URI type symptoms for the past 2 weeks.  She also reported  chest pain.  EKG was done by her PCP and she was sent to the emergency department.  She was found to be tender to palpation along her chest wall.  Her troponins were negative and EKG reassuring.  Her labs demonstrated CKD stage IIIb.  She was discharged home.  She is here for further recommendations.  Since that episode, she has not experienced any further chest congestion or pain.  She has asthma and experiences fatigue but reports no issues with breathing. No leg swelling, blacking out spells, or recent episodes of chest pain. She walks for thirty minutes daily in ten-minute increments without experiencing any chest pain during these activities.  She is currently taking lisinopril , which she states is for her kidneys rather than high blood pressure. No history of diabetes.     Current Medications: Current Meds  Medication Sig   albuterol  (VENTOLIN  HFA) 108 (90 Base) MCG/ACT inhaler Inhale 2 puffs into the lungs every 6 (six) hours as needed for wheezing or shortness of breath.   Cholecalciferol 50 MCG (2000 UT) CAPS 1 capsule.   cyclobenzaprine  (FLEXERIL ) 5 MG tablet Take 1 tablet (5 mg total) by mouth 3 (three) times daily as needed for muscle spasms.   Ferrous Sulfate (IRON PO) Take by mouth daily.   fluocinonide  cream (LIDEX ) 0.05 % APPLY CREAM EXTERNALLY TO AFFECTED AREA TWICE DAILY FOR 1 TO 2 WEEKS as NEEDED FOR RASH External; Duration: 90 days   fluocinonide -emollient (LIDEX -E) 0.05 % cream Apply 1 Application topically 3 (three) times daily.   GAVILYTE-C 240 g solution See admin instructions.   hydrOXYzine  (ATARAX ) 25 MG  tablet Take 1 tablet (25 mg total) by mouth as needed.   linaclotide (LINZESS) 145 MCG CAPS capsule 1 capsule at least 30 minutes before the first meal of the day on an empty stomach Orally Once a day; Duration: 30 days   lisinopril  (ZESTRIL ) 20 MG tablet Take 1 tablet (20 mg total) by mouth daily.   methimazole  (TAPAZOLE ) 5 MG tablet 1 tablet daily Monday through  Thursday , none on Friday  Saturday and Sundays   Nutritional Supplements (VITAMIN D  BOOSTER PO) Take 1 tablet by mouth daily.     Review of Systems:   Please see the history of present illness.    All other systems reviewed and are negative.     EKGs/Labs/Other Test Reviewed:   EKG: 2025 normal sinus rhythm  EKG Interpretation Date/Time:    Ventricular Rate:    PR Interval:    QRS Duration:    QT Interval:    QTC Calculation:   R Axis:      Text Interpretation:          CARDIAC STUDIES: Refer to CV Procedures and Imaging Tabs   Risk Assessment/Calculations:          Physical Exam:   VS:  BP 128/76   Pulse 80   Ht 5' 6 (1.676 m)   Wt 143 lb (64.9 kg)   SpO2 98%   BMI 23.08 kg/m        Wt Readings from Last 3 Encounters:  05/09/24 143 lb (64.9 kg)  03/26/24 146 lb (66.2 kg)  09/22/23 146 lb (66.2 kg)      GENERAL:  No apparent distress, AOx3 HEENT:  No carotid bruits, +2 carotid impulses, no scleral icterus CAR: RRR no murmurs, gallops, rubs, or thrills RES:  Clear to auscultation bilaterally ABD:  Soft, nontender, nondistended, positive bowel sounds x 4 VASC:  +2 radial pulses, +2 carotid pulses NEURO:  CN 2-12 grossly intact; motor and sensory grossly intact PSYCH:  No active depression or anxiety EXT:  No edema, ecchymosis, or cyanosis  Signed, Lurena MARLA Red, MD  05/09/2024 5:12 PM    [facility] [address] [place], [place], [place]  [address] Phone: [phone]; Fax: [phone]   Note:  This document was prepared using Dragon voice recognition software and may include unintentional dictation errors.

## 2024-05-01 ENCOUNTER — Other Ambulatory Visit: Payer: Self-pay | Admitting: Family Medicine

## 2024-05-01 DIAGNOSIS — R1030 Lower abdominal pain, unspecified: Secondary | ICD-10-CM

## 2024-05-01 DIAGNOSIS — K5904 Chronic idiopathic constipation: Secondary | ICD-10-CM | POA: Diagnosis not present

## 2024-05-01 DIAGNOSIS — R1084 Generalized abdominal pain: Secondary | ICD-10-CM | POA: Diagnosis not present

## 2024-05-01 DIAGNOSIS — N83209 Unspecified ovarian cyst, unspecified side: Secondary | ICD-10-CM

## 2024-05-09 ENCOUNTER — Ambulatory Visit: Attending: Internal Medicine | Admitting: Internal Medicine

## 2024-05-09 ENCOUNTER — Encounter: Payer: Self-pay | Admitting: Internal Medicine

## 2024-05-09 VITALS — BP 128/76 | HR 80 | Ht 66.0 in | Wt 143.0 lb

## 2024-05-09 DIAGNOSIS — D509 Iron deficiency anemia, unspecified: Secondary | ICD-10-CM

## 2024-05-09 DIAGNOSIS — E0590 Thyrotoxicosis, unspecified without thyrotoxic crisis or storm: Secondary | ICD-10-CM | POA: Diagnosis not present

## 2024-05-09 DIAGNOSIS — N1832 Chronic kidney disease, stage 3b: Secondary | ICD-10-CM | POA: Diagnosis not present

## 2024-05-09 DIAGNOSIS — R072 Precordial pain: Secondary | ICD-10-CM

## 2024-05-09 DIAGNOSIS — I10 Essential (primary) hypertension: Secondary | ICD-10-CM

## 2024-05-09 NOTE — Patient Instructions (Signed)
 Medication Instructions:  Your physician recommends that you continue on your current medications as directed. Please refer to the Current Medication list given to you today.  *If you need a refill on your cardiac medications before your next appointment, please call your pharmacy*  Follow-Up: At [facility], you and your health needs are our priority.  As part of our continuing mission to provide you with exceptional heart care, our providers are all part of one team.  This team includes your primary Cardiologist (physician) and Advanced Practice Providers or APPs (Physician Assistants and Nurse Practitioners) who all work together to provide you with the care you need, when you need it.  Your next appointment:   As needed  Provider:   Lurena Red, MD  We recommend signing up for the patient portal called MyChart.  Sign up information is provided on this After Visit Summary.  MyChart is used to connect with patients for Virtual Visits (Telemedicine).  Patients are able to view lab/test results, encounter notes, upcoming appointments, etc.  Non-urgent messages can be sent to your provider as well.    To learn more about what you can do with MyChart, go to [email or web address].

## 2024-05-15 ENCOUNTER — Encounter: Payer: Self-pay | Admitting: Family Medicine

## 2024-05-22 DIAGNOSIS — R809 Proteinuria, unspecified: Secondary | ICD-10-CM | POA: Diagnosis not present

## 2024-05-22 DIAGNOSIS — N051 Unspecified nephritic syndrome with focal and segmental glomerular lesions: Secondary | ICD-10-CM | POA: Diagnosis not present

## 2024-05-22 DIAGNOSIS — N183 Chronic kidney disease, stage 3 unspecified: Secondary | ICD-10-CM | POA: Diagnosis not present

## 2024-05-22 DIAGNOSIS — D631 Anemia in chronic kidney disease: Secondary | ICD-10-CM | POA: Diagnosis not present

## 2024-05-25 ENCOUNTER — Ambulatory Visit
Admission: RE | Admit: 2024-05-25 | Discharge: 2024-05-25 | Disposition: A | Discharge status: Home / Self Care | Source: Ambulatory Visit | Attending: Family Medicine | Admitting: Family Medicine

## 2024-05-25 DIAGNOSIS — R1030 Lower abdominal pain, unspecified: Secondary | ICD-10-CM

## 2024-05-25 DIAGNOSIS — N83209 Unspecified ovarian cyst, unspecified side: Secondary | ICD-10-CM

## 2024-05-25 MED ORDER — GADOPICLENOL 0.5 MMOL/ML IV SOLN
7.5000 mL | Freq: Once | INTRAVENOUS | Status: AC | PRN
  Administered 2024-05-25: 7.5 mL via INTRAVENOUS

## 2024-07-11 ENCOUNTER — Encounter: Payer: Self-pay | Admitting: Family Medicine

## 2024-08-05 ENCOUNTER — Other Ambulatory Visit: Payer: Self-pay | Admitting: Family Medicine

## 2024-08-05 DIAGNOSIS — Z1231 Encounter for screening mammogram for malignant neoplasm of breast: Secondary | ICD-10-CM

## 2024-08-30 ENCOUNTER — Ambulatory Visit
Admission: RE | Admit: 2024-08-30 | Discharge: 2024-08-30 | Disposition: A | Discharge status: Home / Self Care | Source: Ambulatory Visit | Attending: Family Medicine | Admitting: Family Medicine

## 2024-08-30 DIAGNOSIS — Z1231 Encounter for screening mammogram for malignant neoplasm of breast: Secondary | ICD-10-CM

## 2024-09-27 ENCOUNTER — Ambulatory Visit: Admitting: Internal Medicine

## 2024-09-27 ENCOUNTER — Other Ambulatory Visit

## 2024-09-27 ENCOUNTER — Encounter: Payer: Self-pay | Admitting: Internal Medicine

## 2024-09-27 VITALS — BP 132/72 | HR 96 | Ht 66.0 in | Wt 148.0 lb

## 2024-09-27 DIAGNOSIS — E0590 Thyrotoxicosis, unspecified without thyrotoxic crisis or storm: Secondary | ICD-10-CM | POA: Diagnosis not present

## 2024-09-27 DIAGNOSIS — E042 Nontoxic multinodular goiter: Secondary | ICD-10-CM | POA: Diagnosis not present

## 2024-09-27 LAB — T3, FREE: T3, Free: 2.9 pg/mL (ref 2.3–4.2)

## 2024-09-27 LAB — T4, FREE: Free T4: 1.1 ng/dL (ref 0.8–1.8)

## 2024-09-27 LAB — TSH: TSH: 1.61 m[IU]/L (ref 0.40–4.50)

## 2024-09-27 NOTE — Progress Notes (Signed)
 "   Name: Aimee Austin  MRN/ DOB: [id number], [birth year]    Age/ Sex: 64 y.o., female    PCP: Chet Mad, DO   Reason for Endocrinology Evaluation: Hyperthyroidism      Date of Initial Endocrinology Evaluation: 08/26/2021    HPI: [name] is a 64 y.o. female with a past medical history of HTN and dyslipidemia. The patient presented for initial endocrinology clinic visit on 08/26/2021 for consultative assistance with her Hyperthyroidism.   Patient has been diagnosed with hyperthyroidism on 08/24/2021 when she was noted to have a suppressed TSH at 0.005 you IU/mL and elevated free T3 at 5 pg/mL with normal total T4 at 10.7 UG/DL.   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 She is S/P FNA of the right mid pole and left inferior nodule with benign cytology in 07/2016   She is S/P repeat FNA of the left inferior nodule with benign cytology 12/2021  No Fh of thyroid  disease    Started methimazole  08/2021  TRAB elevated 3.38 IU/L 11/2021  SUBJECTIVE:    Today (09/27/24):  [name] is here for a follow up on hyperthyroidism and MNG  She continues to follow up with nephrology  for focal segmental glomerulosclerosis, on lisinopril .    Weight remains stable  No local neck swelling  No palpitations  No constipation or diarrhea   No Biotin   Methimazole  5 mg, 1 tab Monday through Thursday     HISTORY:  Past Medical History:  Past Medical History:  Diagnosis Date   Anemia    Arthritis    Rheumatoid Arthritis 2010   Asthma    Eczema    Essential hypertension, benign 08/02/2022   Renal disorder    Strep throat    Thyroid  disease    Past Surgical History:  Past Surgical History:  Procedure Laterality Date   ABDOMINAL HYSTERECTOMY  03/1998   Uterus only    Social History:  reports that she has never smoked. She has never used smokeless tobacco. She reports that she does not drink alcohol  and does not use  drugs. Family History: family history includes Cancer in her mother. She was adopted.   HOME MEDICATIONS: Allergies as of 09/27/2024       Reactions   Shellfish Allergy Anaphylaxis   Sulfa Antibiotics Anaphylaxis        Medication List        Accurate as of September 27, 2024  1:02 PM. If you have any questions, ask your nurse or doctor.          albuterol  108 (90 Base) MCG/ACT inhaler Commonly known as: VENTOLIN  HFA Inhale 2 puffs into the lungs every 6 (six) hours as needed for wheezing or shortness of breath.   Cholecalciferol 50 MCG (2000 UT) Caps 1 capsule.   cyclobenzaprine  5 MG tablet Commonly known as: FLEXERIL  Take 1 tablet (5 mg total) by mouth 3 (three) times daily as needed for muscle spasms.   fluocinonide  cream 0.05 % Commonly known as: LIDEX  APPLY CREAM EXTERNALLY TO AFFECTED AREA TWICE DAILY FOR 1 TO 2 WEEKS as NEEDED FOR RASH External; Duration: 90 days   fluocinonide -emollient 0.05 % cream Commonly known as: LIDEX -E Apply 1 Application topically 3 (three) times daily.   GaviLyte-C 240 g solution Generic drug: polyethylene glycol See admin instructions.   hydrOXYzine  25 MG tablet Commonly known as: ATARAX  Take 1 tablet (25 mg total) by mouth as needed.  IRON PO Take by mouth daily.   Linzess 145 MCG Caps capsule Generic drug: linaclotide 1 capsule at least 30 minutes before the first meal of the day on an empty stomach Orally Once a day; Duration: 30 days   lisinopril  20 MG tablet Commonly known as: ZESTRIL  Take 1 tablet (20 mg total) by mouth daily.   methimazole  5 MG tablet Commonly known as: TAPAZOLE  1 tablet daily Monday through Thursday , none on Friday  Saturday and Sundays   VITAMIN D  BOOSTER PO Take 1 tablet by mouth daily.          REVIEW OF SYSTEMS: A comprehensive ROS was conducted with the patient and is negative except as per HPI    OBJECTIVE:  VS: BP 132/72   Pulse 96   Ht 5' 6 (1.676 m)   Wt 148 lb (67.1  kg)   BMI 23.89 kg/m    Wt Readings from Last 3 Encounters:  09/27/24 148 lb (67.1 kg)  05/09/24 143 lb (64.9 kg)  03/26/24 146 lb (66.2 kg)     EXAM: General: Pt appears well and is in NAD  Neck:  Thyroid : Left nodule appreciated  Lungs: Clear with good BS bilat   Heart: Auscultation: RRR.  Extremities:  BL LE: No pretibial edema   Mental Status: Judgment, insight: Intact Orientation: Oriented to time, place, and person Mood and affect: No depression, anxiety, or agitation     DATA REVIEWED:   Latest Reference Range & Units 09/27/24 13:33  TSH 0.40 - 4.50 mIU/L 1.61  Triiodothyronine,Free,Serum 2.3 - 4.2 pg/mL 2.9  T4,Free(Direct) 0.8 - 1.8 ng/dL 1.1      Latest Reference Range & Units Most Recent  TRAB <=2.00 IU/L 3.38 (H) 12/01/21 15:51        Thyroid  ultrasound  10/04/2023  Narrative & Impression  CLINICAL DATA:  Follow up on MNG. S/P benign FNA of the left inferior nodule 12/2021   EXAM: THYROID  ULTRASOUND   TECHNIQUE: Ultrasound examination of the thyroid  gland and adjacent soft tissues was performed.   COMPARISON:  12/20/2021   09/01/2021   09/03/2016   FINDINGS: Parenchymal Echotexture: Mildly heterogenous   Isthmus: 0.3 cm   Right lobe: 4.5 x 2.1 x 2.4 cm   Left lobe: 5.6 x 2.8 x 2.7 cm   _________________________________________________________   Estimated total number of nodules >/= 1 cm: 4   Number of spongiform nodules >/=  2 cm not described below (TR1): 0   Number of mixed cystic and solid nodules >/= 1.5 cm not described below (TR2): 0   _________________________________________________________   Nodule 1: 1.7 x 0.9 x 0.8 cm right mid thyroid  nodule is not significantly changed in size since 07/04/2016 where it measured 1.7 x 1.3 x 1.1 cm which is consistent with a benign etiology.   Nodule 2: 0.7 x 0.6 x 0.4 cm spongiform right mid thyroid  nodule does not meet criteria for imaging surveillance or FNA.   Nodule 3:  1.4 x 1.2 x 0.7 cm solid isoechoic right mid thyroid  nodule (TI-RADS 3) is not significantly changed in size since prior examination and does not meet criteria for FNA or imaging follow-up.   Nodule 4: 1.2 x 1.0 x 0.7 cm solid isoechoic right mid thyroid  nodule is not significantly changed from prior examination and does not meet criteria for FNA or imaging follow-up.   Nodule 5: 3.2 x 2.8 x 2.6 cm solid isoechoic left thyroid  nodule is not significantly changed in size since prior examination.  Please correlate with prior FNA results from 12/20/2021.   IMPRESSION: 1. Previously biopsied left thyroid  nodule is unchanged in size. Please correlate with prior FNA results from 12/20/2021. 2. Remaining right thyroid  nodules do not meet criteria for FNA or imaging follow-up.        FNA right mid nodule 07/15/2016  Benign cytology    FNA left inferior nodule 07/15/2016  Benign cytology     FNA left inferior nodule 12/20/2021   - Consistent with benign follicular nodule (Bethesda category II)  Some cells have Hurthle cell features but present mostly as flat,  honeycomb sheets, in the presence of colloid.  These findings are most  characteristic for a benign follicular nodule rather than a Hurthle cell  neoplasm.   ASSESSMENT/PLAN/RECOMMENDATIONS:   Hyperthyroidism:  - Pt is clinically euthyroid -Patient with Graves' disease due to elevated TRAb, cannot exclude toxic thyroid  nodules as well - TFT's remain within normal range, no change  Medications : Continue methimazole  5 mg , 1 tablet Monday through Thursday   (skip Friday, Saturdays and Sundays)    2. MNG:  - No local neck symptoms - She is S/P benign FNA of the right and left thyroid  nodules in 2017, we repeated FNA of the right inferior nodule due to enlarge and the size in 12/2021 with benign cytology - Will repeat thyroid  ultrasound for this year     F/U in 6 months     Signed electronically by: Stefano Redgie Butts, MD  [facility] End[facility]  [facility] [address] [place] [place], [place] [address] Phone: [phone] FAX: [phone]   CC: Chet Mad, DO [address] [place] [address] Phone: [phone] Fax: [phone]   Return to Endocrinology clinic as below: Future Appointments  Date Time Provider Department Center  09/27/2024  3:00 PM Jex Strausbaugh, Donell Redgie, MD [id number] None         "

## 2024-10-01 ENCOUNTER — Ambulatory Visit: Payer: Self-pay | Admitting: Internal Medicine

## 2024-10-01 DIAGNOSIS — E042 Nontoxic multinodular goiter: Secondary | ICD-10-CM

## 2024-10-01 DIAGNOSIS — E0590 Thyrotoxicosis, unspecified without thyrotoxic crisis or storm: Secondary | ICD-10-CM

## 2024-10-01 DIAGNOSIS — J4520 Mild intermittent asthma, uncomplicated: Secondary | ICD-10-CM

## 2024-10-01 MED ORDER — METHIMAZOLE 5 MG PO TABS: ORAL_TABLET | ORAL | 3 refills | Status: AC

## 2024-10-04 ENCOUNTER — Ambulatory Visit
Admission: RE | Admit: 2024-10-04 | Discharge: 2024-10-04 | Disposition: A | Source: Ambulatory Visit | Attending: Internal Medicine | Admitting: Internal Medicine

## 2024-10-04 DIAGNOSIS — E042 Nontoxic multinodular goiter: Secondary | ICD-10-CM

## 2024-10-08 DIAGNOSIS — J45909 Unspecified asthma, uncomplicated: Secondary | ICD-10-CM | POA: Insufficient documentation

## 2024-10-08 MED ORDER — DILTIAZEM HCL 60 MG PO TABS: 60.0000 mg | ORAL_TABLET | Freq: Three times a day (TID) | ORAL | 0 refills | Status: AC

## 2024-10-08 NOTE — Telephone Encounter (Signed)
 Discussed thyroid  ultrasound results with the patient on 10/08/2024 at 1445   Most of her thyroid  nodules have changed in size  I have recommended proceeding with thyroid  uptake and scan  Patient was advised to hold methimazole  5-7 days prior to uptake and scan   Patient is concerned about palpitations, a prescription for Cardizem  60 mg 3 times daily was sent to the pharmacy in case needed for palpitations, patient with asthma and beta-blockers are contraindicated

## 2025-03-27 ENCOUNTER — Ambulatory Visit: Admitting: Internal Medicine
